# Patient Record
Sex: Female | Born: 1995 | Race: White | Hispanic: No | Marital: Married | Smoking: Never smoker
Health system: Southern US, Community
[De-identification: ages and names within clinical notes are randomized; demographics above are authoritative.]

## PROBLEM LIST (undated history)

## (undated) DIAGNOSIS — R011 Cardiac murmur, unspecified: Secondary | ICD-10-CM

## (undated) DIAGNOSIS — E785 Hyperlipidemia, unspecified: Secondary | ICD-10-CM

## (undated) DIAGNOSIS — K219 Gastro-esophageal reflux disease without esophagitis: Secondary | ICD-10-CM

## (undated) DIAGNOSIS — R748 Abnormal levels of other serum enzymes: Secondary | ICD-10-CM

## (undated) DIAGNOSIS — G43909 Migraine, unspecified, not intractable, without status migrainosus: Secondary | ICD-10-CM

## (undated) DIAGNOSIS — N946 Dysmenorrhea, unspecified: Secondary | ICD-10-CM

## (undated) DIAGNOSIS — F419 Anxiety disorder, unspecified: Secondary | ICD-10-CM

## (undated) DIAGNOSIS — R970 Elevated carcinoembryonic antigen [CEA]: Secondary | ICD-10-CM

## (undated) DIAGNOSIS — R102 Pelvic and perineal pain unspecified side: Secondary | ICD-10-CM

## (undated) DIAGNOSIS — F909 Attention-deficit hyperactivity disorder, unspecified type: Secondary | ICD-10-CM

## (undated) DIAGNOSIS — R112 Nausea with vomiting, unspecified: Secondary | ICD-10-CM

## (undated) DIAGNOSIS — F329 Major depressive disorder, single episode, unspecified: Secondary | ICD-10-CM

## (undated) DIAGNOSIS — Z8616 Personal history of COVID-19: Secondary | ICD-10-CM

## (undated) DIAGNOSIS — Z9889 Other specified postprocedural states: Secondary | ICD-10-CM

## (undated) DIAGNOSIS — F32A Depression, unspecified: Secondary | ICD-10-CM

## (undated) DIAGNOSIS — G4733 Obstructive sleep apnea (adult) (pediatric): Secondary | ICD-10-CM

## (undated) DIAGNOSIS — N939 Abnormal uterine and vaginal bleeding, unspecified: Secondary | ICD-10-CM

## (undated) DIAGNOSIS — L4050 Arthropathic psoriasis, unspecified: Secondary | ICD-10-CM

## (undated) HISTORY — DX: Anxiety disorder, unspecified: F41.9

## (undated) HISTORY — DX: Personal history of COVID-19: Z86.16

## (undated) HISTORY — DX: Migraine, unspecified, not intractable, without status migrainosus: G43.909

## (undated) HISTORY — DX: Major depressive disorder, single episode, unspecified: F32.9

## (undated) HISTORY — DX: Depression, unspecified: F32.A

## (undated) HISTORY — DX: Dysmenorrhea, unspecified: N94.6

---
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
## 2014-07-12 ENCOUNTER — Ambulatory Visit (INDEPENDENT_AMBULATORY_CARE_PROVIDER_SITE_OTHER): Payer: BLUE CROSS/BLUE SHIELD | Admitting: Obstetrics and Gynecology

## 2014-07-12 ENCOUNTER — Encounter: Payer: Self-pay | Admitting: Obstetrics and Gynecology

## 2014-07-12 VITALS — BP 110/70 | HR 99 | Ht 62.0 in | Wt 154.9 lb

## 2014-07-12 DIAGNOSIS — Z30430 Encounter for insertion of intrauterine contraceptive device: Secondary | ICD-10-CM

## 2014-07-12 DIAGNOSIS — Z309 Encounter for contraceptive management, unspecified: Secondary | ICD-10-CM | POA: Insufficient documentation

## 2014-07-12 DIAGNOSIS — Z30431 Encounter for routine checking of intrauterine contraceptive device: Secondary | ICD-10-CM | POA: Diagnosis not present

## 2014-07-12 DIAGNOSIS — N946 Dysmenorrhea, unspecified: Secondary | ICD-10-CM

## 2014-07-12 DIAGNOSIS — F329 Major depressive disorder, single episode, unspecified: Secondary | ICD-10-CM

## 2014-07-12 DIAGNOSIS — F419 Anxiety disorder, unspecified: Principal | ICD-10-CM

## 2014-07-12 NOTE — Patient Instructions (Signed)
Can remove in 3 years, or sooner if pregnancy desired.  To f/u within 1 year for initial pap smear.

## 2014-07-12 NOTE — Progress Notes (Signed)
  GYNECOLOGY CLINIC PROGRESS NOTE  History:  19 y.o. P0 here today for today for IUD string check; Skyla IUD was placed  4 weeks ago. No complaints about the Mirena, no concerning side effects.  Has had a menstrual cycle which was normal.   The following portions of the patient's history were reviewed and updated as appropriate: allergies, current medications, past family history, past medical history, past social history, past surgical history and problem list.  Review of Systems:  Pertinent items are noted in HPI.  Objective:  Physical Exam Blood pressure 110/70, pulse 99, height 5\' 2"  (1.575 m), weight 154 lb 14.4 oz (70.262 kg), last menstrual period 07/07/2014. Gen: NAD Abd: Soft, nontender and nondistended Pelvic: Normal appearing external genitalia; normal appearing vaginal mucosa and cervix.  IUD strings visualized, about 2 cm in length outside cervix.   Assessment & Plan:  Normal IUD check. Patient to keep IUD in place for three years; can come in for removal if she desires pregnancy within the next three years. Routine preventative health maintenance measures emphasized.    Hildred Laser, MD [facility]

## 2014-07-16 ENCOUNTER — Telehealth: Payer: Self-pay | Admitting: Gastroenterology

## 2014-07-16 NOTE — Telephone Encounter (Signed)
Go ahead and schedule biopsy?

## 2014-07-16 NOTE — Telephone Encounter (Signed)
Pt mother called, She had her daughters labs repeated at her job, And she said the liver enzymes were still elevated, per mother it was mentioned at 04/2014 visit if they came back still abnormal then she may need a liver BX, mother will fax labs over they will be in chart soon, please contact mother with next step after reviewing labs.

## 2014-07-18 ENCOUNTER — Other Ambulatory Visit: Payer: Self-pay

## 2014-07-18 DIAGNOSIS — R748 Abnormal levels of other serum enzymes: Secondary | ICD-10-CM

## 2014-07-18 NOTE — Telephone Encounter (Signed)
Yes please set patient up for a liver biopsy.

## 2014-07-18 NOTE — Telephone Encounter (Signed)
LVM for mother to return my call to schedule biopsy.

## 2014-07-20 NOTE — Telephone Encounter (Signed)
Spoke with Mother Carollee HerterShannon regarding pt's liver biopsy. Per Radiology dept, H & P needs to be within 30 days of the biopsy. Pt's last OV was in April 2016. Mother has decided to schedule appt with Dr. Servando SnareWohl to discuss risks of the biopsy before scheduling.

## 2014-07-25 ENCOUNTER — Other Ambulatory Visit: Payer: Self-pay | Admitting: Gastroenterology

## 2014-07-25 DIAGNOSIS — R7989 Other specified abnormal findings of blood chemistry: Secondary | ICD-10-CM

## 2014-07-25 DIAGNOSIS — R945 Abnormal results of liver function studies: Principal | ICD-10-CM

## 2014-07-31 ENCOUNTER — Ambulatory Visit: Payer: Self-pay | Admitting: Gastroenterology

## 2014-07-31 ENCOUNTER — Encounter: Payer: Self-pay | Admitting: Gastroenterology

## 2014-07-31 ENCOUNTER — Ambulatory Visit (INDEPENDENT_AMBULATORY_CARE_PROVIDER_SITE_OTHER): Payer: BLUE CROSS/BLUE SHIELD | Admitting: Gastroenterology

## 2014-07-31 ENCOUNTER — Other Ambulatory Visit: Payer: Self-pay

## 2014-07-31 ENCOUNTER — Other Ambulatory Visit: Payer: Self-pay | Admitting: Gastroenterology

## 2014-07-31 VITALS — BP 129/74 | HR 84 | Temp 98.6°F | Ht 62.0 in | Wt 154.0 lb

## 2014-07-31 DIAGNOSIS — G441 Vascular headache, not elsewhere classified: Secondary | ICD-10-CM | POA: Insufficient documentation

## 2014-07-31 DIAGNOSIS — R748 Abnormal levels of other serum enzymes: Secondary | ICD-10-CM | POA: Insufficient documentation

## 2014-07-31 DIAGNOSIS — R970 Elevated carcinoembryonic antigen [CEA]: Secondary | ICD-10-CM | POA: Insufficient documentation

## 2014-07-31 DIAGNOSIS — E785 Hyperlipidemia, unspecified: Secondary | ICD-10-CM | POA: Insufficient documentation

## 2014-07-31 DIAGNOSIS — E559 Vitamin D deficiency, unspecified: Secondary | ICD-10-CM | POA: Insufficient documentation

## 2014-07-31 DIAGNOSIS — R011 Cardiac murmur, unspecified: Secondary | ICD-10-CM | POA: Insufficient documentation

## 2014-07-31 DIAGNOSIS — K219 Gastro-esophageal reflux disease without esophagitis: Secondary | ICD-10-CM | POA: Insufficient documentation

## 2014-07-31 NOTE — Progress Notes (Signed)
   Primary Care Physician: Evelene CroonNIEMEYER, MEINDERT, MD  Primary Gastroenterologist:  Dr. Midge Miniumarren Elika Godar  Chief Complaint  Patient presents with  . Elevated Hepatic Enzymes    HPI: Rose Howard is a 19 y.o. female here who has abnormal liver enzymes and is in need of a liver biopsy. The patient could not undergo a liver biopsy unless she had a H&P within the last 30 days. Is no complaint the present time.  Current Outpatient Prescriptions  Medication Sig Dispense Refill  . ALPRAZolam (XANAX) 0.5 MG tablet Take by mouth.    . Levonorgestrel (SKYLA) 13.5 MG IUD by Intrauterine route.     No current facility-administered medications for this visit.    Allergies as of 07/31/2014  . (No Known Allergies)    ROS:  General: Negative for anorexia, weight loss, fever, chills, fatigue, weakness. ENT: Negative for hoarseness, difficulty swallowing , nasal congestion. CV: Negative for chest pain, angina, palpitations, dyspnea on exertion, peripheral edema.  Respiratory: Negative for dyspnea at rest, dyspnea on exertion, cough, sputum, wheezing.  GI: See history of present illness. GU:  Negative for dysuria, hematuria, urinary incontinence, urinary frequency, nocturnal urination.  Endo: Negative for unusual weight change.    Physical Examination:   BP 129/74 mmHg  Pulse 84  Temp(Src) 98.6 F (37 C)  Ht 5\' 2"  (1.575 m)  Wt 154 lb (69.854 kg)  BMI 28.16 kg/m2  LMP 05/25/2014  General: Well-nourished, well-developed in no acute distress.  Eyes: No icterus. Conjunctivae pink. Mouth: Oropharyngeal mucosa moist and pink , no lesions erythema or exudate. Lungs: Clear to auscultation bilaterally. Non-labored. Heart: Regular rate and rhythm, no murmurs rubs or gallops.  Abdomen: Bowel sounds are normal, nontender, nondistended, no hepatosplenomegaly or masses, no abdominal bruits or hernia , no rebound or guarding.   Extremities: No lower extremity edema. No clubbing or deformities. Neuro: Alert  and oriented x 3.  Grossly intact. Skin: Warm and dry, no jaundice.   Psych: Alert and cooperative, normal mood and affect.  Labs:    Imaging Studies: No results found.  Assessment and Plan:   Rose Howard is a 19 y.o. y/o female who has abnormal liver enzymes and is set up to have a liver biopsy. The patient has been given an opportunity to ask questions about the liver biopsy. Also been told to stay away from anti-inflammatory is an aspirin for 1 week prior to having a liver biopsy.

## 2014-08-09 ENCOUNTER — Other Ambulatory Visit: Payer: Self-pay | Admitting: Radiology

## 2014-08-10 ENCOUNTER — Ambulatory Visit
Admission: RE | Admit: 2014-08-10 | Discharge: 2014-08-10 | Disposition: A | Discharge status: Home / Self Care | Payer: BLUE CROSS/BLUE SHIELD | Source: Ambulatory Visit | Attending: Gastroenterology | Admitting: Gastroenterology

## 2014-08-10 DIAGNOSIS — Z8249 Family history of ischemic heart disease and other diseases of the circulatory system: Secondary | ICD-10-CM | POA: Diagnosis not present

## 2014-08-10 DIAGNOSIS — K219 Gastro-esophageal reflux disease without esophagitis: Secondary | ICD-10-CM | POA: Diagnosis not present

## 2014-08-10 DIAGNOSIS — Z975 Presence of (intrauterine) contraceptive device: Secondary | ICD-10-CM | POA: Insufficient documentation

## 2014-08-10 DIAGNOSIS — R011 Cardiac murmur, unspecified: Secondary | ICD-10-CM | POA: Insufficient documentation

## 2014-08-10 DIAGNOSIS — R945 Abnormal results of liver function studies: Secondary | ICD-10-CM

## 2014-08-10 DIAGNOSIS — R970 Elevated carcinoembryonic antigen [CEA]: Secondary | ICD-10-CM | POA: Diagnosis not present

## 2014-08-10 DIAGNOSIS — E559 Vitamin D deficiency, unspecified: Secondary | ICD-10-CM | POA: Insufficient documentation

## 2014-08-10 DIAGNOSIS — F418 Other specified anxiety disorders: Secondary | ICD-10-CM | POA: Insufficient documentation

## 2014-08-10 DIAGNOSIS — K7290 Hepatic failure, unspecified without coma: Secondary | ICD-10-CM | POA: Insufficient documentation

## 2014-08-10 DIAGNOSIS — E785 Hyperlipidemia, unspecified: Secondary | ICD-10-CM | POA: Insufficient documentation

## 2014-08-10 DIAGNOSIS — R7989 Other specified abnormal findings of blood chemistry: Secondary | ICD-10-CM

## 2014-08-10 DIAGNOSIS — I899 Noninfective disorder of lymphatic vessels and lymph nodes, unspecified: Secondary | ICD-10-CM | POA: Diagnosis not present

## 2014-08-10 DIAGNOSIS — N946 Dysmenorrhea, unspecified: Secondary | ICD-10-CM | POA: Diagnosis not present

## 2014-08-10 DIAGNOSIS — G441 Vascular headache, not elsewhere classified: Secondary | ICD-10-CM | POA: Insufficient documentation

## 2014-08-10 HISTORY — PX: LIVER BIOPSY: SHX301

## 2014-08-10 HISTORY — DX: Abnormal levels of other serum enzymes: R74.8

## 2014-08-10 HISTORY — DX: Gastro-esophageal reflux disease without esophagitis: K21.9

## 2014-08-10 LAB — CBC
HCT: 42.1 % (ref 35.0–47.0)
Hemoglobin: 14.3 g/dL (ref 12.0–16.0)
MCH: 29.9 pg (ref 26.0–34.0)
MCHC: 34.1 g/dL (ref 32.0–36.0)
MCV: 87.7 fL (ref 80.0–100.0)
Platelets: 335 10*3/uL (ref 150–440)
RBC: 4.8 MIL/uL (ref 3.80–5.20)
RDW: 12.5 % (ref 11.5–14.5)
WBC: 7.1 10*3/uL (ref 3.6–11.0)

## 2014-08-10 LAB — PROTIME-INR
INR: 0.93
Prothrombin Time: 12.7 seconds (ref 11.4–15.0)

## 2014-08-10 MED ORDER — MIDAZOLAM HCL 5 MG/5ML IJ SOLN
INTRAMUSCULAR | Status: AC | PRN
  Administered 2014-08-10 (×2): 0.5 mg via INTRAVENOUS
  Administered 2014-08-10: 1 mg via INTRAVENOUS

## 2014-08-10 MED ORDER — KETOROLAC TROMETHAMINE 30 MG/ML IJ SOLN
30.0000 mg | Freq: Once | INTRAMUSCULAR | Status: AC
  Administered 2014-08-10: 30 mg via INTRAVENOUS
  Filled 2014-08-10: qty 1

## 2014-08-10 MED ORDER — FENTANYL CITRATE (PF) 100 MCG/2ML IJ SOLN
INTRAMUSCULAR | Status: AC | PRN
  Administered 2014-08-10: 25 ug via INTRAVENOUS
  Administered 2014-08-10: 50 ug via INTRAVENOUS

## 2014-08-10 MED ORDER — SODIUM CHLORIDE 0.9 % IV SOLN: Freq: Once | INTRAVENOUS | Status: DC

## 2014-08-10 NOTE — Procedures (Signed)
Interventional Radiology Procedure Note  Procedure: US guided   core biopsy of right liver lobe.  Complications: None Recommendations: - Bedrest supine x 2 hrs   - Follow biopsy results  Signed,  Yvone Neu. Loreta Ave, DO

## 2014-08-10 NOTE — H&P (Signed)
Chief Complaint: I am here for a liver biopsy.   Referring Physician(s): Wohl,Darren  History of Present Illness: Rose Howard is a 19 y.o. female referred by Dr. Servando Snare for a liver biopsy.  She has no significant medical history, but was found to have transaminitis on recent lab exams.    She has no history of bleeding problems.   Past Medical History  Diagnosis Date  . Anxiety   . Depression   . Dysmenorrhea   . GERD (gastroesophageal reflux disease)   . Abnormal liver enzymes     Past Surgical History  Procedure Laterality Date  . Breast surgery Bilateral 2014    Breast Reduction    Allergies: Review of patient's allergies indicates no known allergies.  Medications: Prior to Admission medications   Medication Sig Start Date End Date Taking? Authorizing Provider  ALPRAZolam Prudy Feeler) 0.5 MG tablet Take by mouth.   Yes Historical Provider, MD  Levonorgestrel (SKYLA) 13.5 MG IUD by Intrauterine route.    Historical Provider, MD     Family History  Problem Relation Age of Onset  . Heart disease Father   . Hypercholesterolemia Mother     History   Social History  . Marital Status: Single    Spouse Name: N/A  . Number of Children: N/A  . Years of Education: N/A   Social History Main Topics  . Smoking status: Never Smoker   . Smokeless tobacco: Never Used  . Alcohol Use: 0.6 oz/week    1 Glasses of wine per week  . Drug Use: No  . Sexual Activity: Yes    Birth Control/ Protection: IUD     Comment: Skyla inserted 06/13/2014   Other Topics Concern  . None   Social History Narrative       Review of Systems: A 12 point ROS discussed and pertinent positives are indicated in the HPI above.  All other systems are negative.  Review of Systems  Vital Signs: BP 128/98 mmHg  Pulse 66  Temp(Src) 98.5 F (36.9 C) (Oral)  Resp 13  Ht 5\' 2"  (1.575 m)  Wt 150 lb (68.04 kg)  BMI 27.43 kg/m2  SpO2 100%  LMP 05/25/2014 (Exact Date)  Physical  Exam  Mallampati Score:  2  Imaging: No results found.  Labs:  CBC:  Recent Labs  08/10/14 1050  WBC 7.1  HGB 14.3  HCT 42.1  PLT 335    COAGS:  Recent Labs  08/10/14 1050  INR 0.93    BMP: No results for input(s): NA, K, CL, CO2, GLUCOSE, BUN, CALCIUM, CREATININE, GFRNONAA, GFRAA in the last 8760 hours.  Invalid input(s): CMP  LIVER FUNCTION TESTS: No results for input(s): BILITOT, AST, ALT, ALKPHOS, PROT, ALBUMIN in the last 8760 hours.  TUMOR MARKERS: No results for input(s): AFPTM, CEA, CA199, CHROMGRNA in the last 8760 hours.  Assessment and Plan: Rose Howard is a 19 yo female presenting for a medical-liver biopsy.    Percutaneous biopsy is planned.    Risks and Benefits discussed with the patient and the patient's mother including, but not limited to bleeding, infection, damage to adjacent structures or low yield requiring additional tests. All of the patient's questions were answered, patient is agreeable to proceed. Consent signed and in chart.    Thank you for this interesting consult.  I greatly enjoyed meeting [facility] and look forward to participating in their care.  A copy of this report was sent to the requesting provider on this date.  SignedGilmer Mor 08/10/2014, 12:46 PM   I spent a total of    15 Minutes in face to face in clinical consultation, greater than 50% of which was counseling/coordinating care for transaminitis, percutaneous liver biopsy.

## 2014-08-15 LAB — SURGICAL PATHOLOGY

## 2014-09-05 ENCOUNTER — Telehealth: Payer: Self-pay | Admitting: Gastroenterology

## 2014-09-05 NOTE — Telephone Encounter (Signed)
Results please?

## 2014-09-06 NOTE — Telephone Encounter (Signed)
Please review pt's liver biopsy and advise.

## 2014-09-11 NOTE — Telephone Encounter (Signed)
LVM for pt to schedule follow up Liver biopsy results.

## 2014-09-11 NOTE — Telephone Encounter (Signed)
Have the patient come in to discuss the results

## 2014-10-24 ENCOUNTER — Encounter: Payer: Self-pay | Admitting: Gastroenterology

## 2014-10-24 ENCOUNTER — Ambulatory Visit (INDEPENDENT_AMBULATORY_CARE_PROVIDER_SITE_OTHER): Payer: BLUE CROSS/BLUE SHIELD | Admitting: Gastroenterology

## 2014-10-24 VITALS — BP 116/67 | HR 74 | Temp 98.1°F | Ht 62.0 in | Wt 156.0 lb

## 2014-10-24 DIAGNOSIS — R748 Abnormal levels of other serum enzymes: Secondary | ICD-10-CM

## 2014-10-24 NOTE — Progress Notes (Signed)
   Primary Care Physician: Evelene Croon, MD  Primary Gastroenterologist:  Dr. Midge Minium  Chief Complaint  Patient presents with  . follow up liver biopsy    HPI: Rose Howard is a 19 y.o. female here for follow-up after having a liver biopsy. The liver biopsy showed a lymphocytic infiltrate with eosinophils. The pathologist reported this to be a drug effect versus viral with less likely early stage autoimmune disease. The patient has had these abnormal liver enzymes since 2010 as reported by her. There was minimal inflammation seen without any fibrosis or increased iron staining.  Current Outpatient Prescriptions  Medication Sig Dispense Refill  . ALPRAZolam (XANAX) 0.5 MG tablet Take by mouth.    . Levonorgestrel (SKYLA) 13.5 MG IUD by Intrauterine route.     No current facility-administered medications for this visit.    Allergies as of 10/24/2014  . (No Known Allergies)    ROS:  General: Negative for anorexia, weight loss, fever, chills, fatigue, weakness. ENT: Negative for hoarseness, difficulty swallowing , nasal congestion. CV: Negative for chest pain, angina, palpitations, dyspnea on exertion, peripheral edema.  Respiratory: Negative for dyspnea at rest, dyspnea on exertion, cough, sputum, wheezing.  GI: See history of present illness. GU:  Negative for dysuria, hematuria, urinary incontinence, urinary frequency, nocturnal urination.  Endo: Negative for unusual weight change.    Physical Examination:   BP 116/67 mmHg  Pulse 74  Temp(Src) 98.1 F (36.7 C) (Oral)  Ht  (1.575 m)  Wt 156 lb (70.761 kg)  BMI 28.53 kg/m2  General: Well-nourished, well-developed in no acute distress.  Eyes: No icterus. Conjunctivae pink. Neuro: Alert and oriented x 3.  Grossly intact. Skin: Warm and dry, no jaundice.   Psych: Alert and cooperative, normal mood and affect.  Labs:    Imaging Studies: No results found.  Assessment and Plan:   Rose Howard is a 19  y.o. y/o female who comes today for the results of her liver biopsy. The biopsy was consistent with viral versus medication effect and less likely early autoimmune disease. The patient only takes Advil as needed and is on birth control. She denies any other medications and states this is been going on since 2010. She has been told that the amount of damage from her liver enzymes being elevated this long was minimal. The patient's SMA was elevated slightly. He should has been reassured that the amount of damage is minimal and we will recheck her liver enzymes today. The patient has been explained the plan and agrees with it.    Note: This dictation was prepared with Dragon dictation along with smaller phrase technology. Any transcriptional errors that result from this process are unintentional.

## 2015-07-03 ENCOUNTER — Encounter: Payer: Self-pay | Admitting: Obstetrics and Gynecology

## 2015-07-03 ENCOUNTER — Ambulatory Visit (INDEPENDENT_AMBULATORY_CARE_PROVIDER_SITE_OTHER): Payer: Self-pay | Admitting: Obstetrics and Gynecology

## 2015-07-03 VITALS — BP 109/74 | HR 74 | Ht 62.0 in | Wt 153.8 lb

## 2015-07-03 DIAGNOSIS — N898 Other specified noninflammatory disorders of vagina: Secondary | ICD-10-CM

## 2015-07-03 NOTE — Progress Notes (Signed)
Subjective:     Rose Howard is a 20 y.o. female who presents for evaluation of an abnormal vaginal discharge. Symptoms have been present for 2 months. Vaginal symptoms: discharge described as yellowish and odor occasionally. Contraception: IUD. She denies abnormal bleeding, burning, pain, vulvar itching and urinary symptoms. Sexually transmitted infection risk: very low risk of STD exposure (currently with 1 partner). Menstrual flow: regular every 28-30 days.  Recently completed antibiotic course for sinus infection.   The following portions of the patient's history were reviewed and updated as appropriate: allergies, current medications, past family history, past medical history, past social history, past surgical history and problem list.   Review of Systems Pertinent items noted in HPI and remainder of comprehensive ROS otherwise negative.    Objective:    BP 109/74 mmHg  Pulse 74  Ht 5\' 2"  (1.575 m)  Wt 153 lb 12.8 oz (69.763 kg)  BMI 28.12 kg/m2 General appearance: alert and no distress Abdomen: soft, non-tender; bowel sounds normal; no masses,  no organomegaly Pelvic: external genitalia normal, rectovaginal septum normal.  Vagina with scant thin white discharge, no odor.  Cervix normal appearing, no lesions and no motion tenderness. IUD strings visible ~ 2 cm from cervical os. Uterus mobile, nontender, normal shape and size.  Adnexae non-palpable, nontender bilaterally.       Microscopic wet-mount exam shows negative for pathogens, normal epithelial cells.  Assessment:    Normal physiologic discharge.    Plan:    Symptomatic local care discussed.   Nuswab performed.  Support your immune system: reduce stress, get adequate sleep and exercise regularly. Eat plain, probiotic yogurt daily or take a daily vaginal probiotic (can be found in the feminine aisle at any pharmacy) containing Acidophillus (they should contain at least 1 billion CFU's- this is information found on the  bottle).  Avoid harsh soaps, and detergents.  Can use vaginal washes like Vagisil or Summer's Eve.  Avoid routine douching, and if you notice that sex can trigger infections for you, use condoms. For some women who get infections easily, both douching and the alkaline nature of semen can disrupt the vaginal pH. Call for results in 1 week.     Hildred LaserAnika Winton Offord, MD [facility]

## 2015-07-07 LAB — NUSWAB VAGINITIS PLUS (VG+)
CHLAMYDIA TRACHOMATIS, NAA: NEGATIVE
Candida albicans, NAA: NEGATIVE
Candida glabrata, NAA: NEGATIVE
Neisseria gonorrhoeae, NAA: NEGATIVE
Trich vag by NAA: NEGATIVE

## 2016-04-10 ENCOUNTER — Encounter: Payer: Self-pay | Admitting: Obstetrics and Gynecology

## 2016-04-10 ENCOUNTER — Ambulatory Visit (INDEPENDENT_AMBULATORY_CARE_PROVIDER_SITE_OTHER): Payer: BLUE CROSS/BLUE SHIELD | Admitting: Obstetrics and Gynecology

## 2016-04-10 VITALS — BP 115/77 | HR 61 | Ht 61.0 in | Wt 149.7 lb

## 2016-04-10 DIAGNOSIS — L739 Follicular disorder, unspecified: Secondary | ICD-10-CM | POA: Diagnosis not present

## 2016-04-10 NOTE — Progress Notes (Signed)
    GYNECOLOGY PROGRESS NOTE  Subjective:    Patient ID: Brylee H. Hebard, female    DOB: 01/24/1995, 21 y.o.   MRN: [id number]  HPI  Patient is a 21 y.o. G0P0000 female who presents for for complaints of "knots on her vagina".  Patient states that this has been occurring intermittently since she was a teenager.  Occurs often times after shaving, but sometimes happens even without shaving.  Denies STI exposure.   The following portions of the patient's history were reviewed and updated as appropriate: allergies, current medications, past family history, past medical history, past social history, past surgical history and problem list.  Review of Systems Pertinent items noted in HPI and remainder of comprehensive ROS otherwise negative.   Objective:   Blood pressure 115/77, pulse 61, height 5\' 1"  (1.549 m), weight 149 lb 11.2 oz (67.9 kg). General appearance: alert and no distress Pelvic: External genitalia with several raised slightly erythematous lesions resembling folliculitis on right labia majora. Normal vaginal mucosa, no lesions. No vaginal discharge. Cervix appears normal, IUD strings visible from cervical os approximately 3 cm in length. Bimanual exam deferred.  Assessment:   Folliculitis  Plan:   - Discussed hygiene measures including wearing looser clothing (patient notes that she wears a lot of tight fitting pants and underwear, however mostly cotton material), sterilizing razors with alcohol, using an antibacterial soap and vaginal region prior to shaving.  Also encouraged use of antiseptic treatment after shaving, such as Tin-skin.  - To follow-up as needed.   Hildred LaserAnika Gerardo Territo, MD [facility]

## 2016-04-10 NOTE — Patient Instructions (Signed)
Folliculitis Folliculitis is inflammation of the hair follicles. Folliculitis most commonly occurs on the scalp, thighs, legs, back, and buttocks. However, it can occur anywhere on the body. What are the causes? This condition may be caused by:  A bacterial infection (common).  A fungal infection.  A viral infection.  Coming into contact with certain chemicals, especially oils and tars.  Shaving or waxing.  Applying greasy ointments or creams to your skin often. Long-lasting folliculitis and folliculitis that keeps coming back can be caused by bacteria that live in the nostrils. What increases the risk? This condition is more likely to develop in people with:  A weakened immune system.  Diabetes.  Obesity. What are the signs or symptoms? Symptoms of this condition include:  Redness.  Soreness.  Swelling.  Itching.  Small white or yellow, pus-filled, itchy spots (pustules) that appear over a reddened area. If there is an infection that goes deep into the follicle, these may develop into a boil (furuncle).  A group of closely packed boils (carbuncle). These tend to form in hairy, sweaty areas of the body. How is this diagnosed? This condition is diagnosed with a skin exam. To find what is causing the condition, your health care provider may take a sample of one of the pustules or boils for testing. How is this treated? This condition may be treated by:  Applying warm compresses to the affected areas.  Taking an antibiotic medicine or applying an antibiotic medicine to the skin.  Applying or bathing with an antiseptic solution.  Taking an over-the-counter medicine to help with itching.  Having a procedure to drain any pustules or boils. This may be done if a pustule or boil contains a lot of pus or fluid.  Laser hair removal. This may be done to treat long-lasting folliculitis. Follow these instructions at home:  If directed, apply heat to the affected area as  often as told by your health care provider. Use the heat source that your health care provider recommends, such as a moist heat pack or a heating pad.  Place a towel between your skin and the heat source.  Leave the heat on for 20-30 minutes.  Remove the heat if your skin turns bright red. This is especially important if you are unable to feel pain, heat, or cold. You may have a greater risk of getting burned.  If you were prescribed an antibiotic medicine, use it as told by your health care provider. Do not stop using the antibiotic even if you start to feel better.  Take over-the-counter and prescription medicines only as told by your health care provider.  Do not shave irritated skin.  Keep all follow-up visits as told by your health care provider. This is important. Get help right away if:  You have more redness, swelling, or pain in the affected area.  Red streaks are spreading from the affected area.  You have a fever. This information is not intended to replace advice given to you by your health care provider. Make sure you discuss any questions you have with your health care provider. Document Released: 03/16/2001 Document Revised: 07/26/2015 Document Reviewed: 10/26/2014 Elsevier Interactive Patient Education  2017 [employer]  

## 2016-06-23 ENCOUNTER — Encounter: Payer: Self-pay | Admitting: Emergency Medicine

## 2016-06-23 DIAGNOSIS — R112 Nausea with vomiting, unspecified: Secondary | ICD-10-CM | POA: Insufficient documentation

## 2016-06-23 DIAGNOSIS — R109 Unspecified abdominal pain: Secondary | ICD-10-CM | POA: Diagnosis present

## 2016-06-23 DIAGNOSIS — R1084 Generalized abdominal pain: Secondary | ICD-10-CM | POA: Insufficient documentation

## 2016-06-23 LAB — COMPREHENSIVE METABOLIC PANEL
ALBUMIN: 4.8 g/dL (ref 3.5–5.0)
ALK PHOS: 141 U/L — AB (ref 38–126)
ALT: 132 U/L — ABNORMAL HIGH (ref 14–54)
ANION GAP: 11 (ref 5–15)
AST: 71 U/L — ABNORMAL HIGH (ref 15–41)
BILIRUBIN TOTAL: 0.9 mg/dL (ref 0.3–1.2)
BUN: 13 mg/dL (ref 6–20)
CO2: 27 mmol/L (ref 22–32)
CREATININE: 0.72 mg/dL (ref 0.44–1.00)
Calcium: 9.9 mg/dL (ref 8.9–10.3)
Chloride: 102 mmol/L (ref 101–111)
GFR calc Af Amer: >60 mL/min (ref >60)
GFR calc non Af Amer: >60 mL/min (ref >60)
GLUCOSE: 99 mg/dL (ref 65–99)
Potassium: 3.5 mmol/L (ref 3.5–5.1)
SODIUM: 140 mmol/L (ref 135–145)
Total Protein: 7.9 g/dL (ref 6.5–8.1)

## 2016-06-23 LAB — CBC
HCT: 42.4 % (ref 35.0–47.0)
Hemoglobin: 14.9 g/dL (ref 12.0–16.0)
MCH: 30 pg (ref 26.0–34.0)
MCHC: 35.2 g/dL (ref 32.0–36.0)
MCV: 85.1 fL (ref 80.0–100.0)
PLATELETS: 359 10*3/uL (ref 150–440)
RBC: 4.98 MIL/uL (ref 3.80–5.20)
RDW: 12.4 % (ref 11.5–14.5)
WBC: 13.4 10*3/uL — ABNORMAL HIGH (ref 3.6–11.0)

## 2016-06-23 LAB — LIPASE, BLOOD: Lipase: 27 U/L (ref 11–51)

## 2016-06-23 NOTE — ED Triage Notes (Signed)
Pt presents to ED with intermittent abd cramping, nausea, and occasional diarrhea the past couple of days. Pt states today her symptoms have continued and she vomited 3X. Denies any cramping currently. Last time vomiting was about an hour ago. Pt alert and calm at this time. Denies any urinary symptoms.

## 2016-06-24 ENCOUNTER — Emergency Department: Payer: BLUE CROSS/BLUE SHIELD

## 2016-06-24 ENCOUNTER — Emergency Department
Admission: EM | Admit: 2016-06-24 | Discharge: 2016-06-24 | Disposition: A | Discharge status: Home / Self Care | Payer: BLUE CROSS/BLUE SHIELD | Attending: Emergency Medicine | Admitting: Emergency Medicine

## 2016-06-24 DIAGNOSIS — R1084 Generalized abdominal pain: Secondary | ICD-10-CM

## 2016-06-24 DIAGNOSIS — R112 Nausea with vomiting, unspecified: Secondary | ICD-10-CM

## 2016-06-24 DIAGNOSIS — R197 Diarrhea, unspecified: Secondary | ICD-10-CM

## 2016-06-24 LAB — URINALYSIS, COMPLETE (UACMP) WITH MICROSCOPIC
BILIRUBIN URINE: NEGATIVE
GLUCOSE, UA: NEGATIVE mg/dL
HGB URINE DIPSTICK: NEGATIVE
Ketones, ur: 5 mg/dL — AB
Nitrite: NEGATIVE
PROTEIN: 30 mg/dL — AB
Specific Gravity, Urine: 1.029 (ref 1.005–1.030)
pH: 6 (ref 5.0–8.0)

## 2016-06-24 LAB — GASTROINTESTINAL PANEL BY PCR, STOOL (REPLACES STOOL CULTURE)
ADENOVIRUS F40/41: NOT DETECTED
ASTROVIRUS: NOT DETECTED
CAMPYLOBACTER SPECIES: NOT DETECTED
Cryptosporidium: NOT DETECTED
Cyclospora cayetanensis: NOT DETECTED
ENTEROPATHOGENIC E COLI (EPEC): NOT DETECTED
Entamoeba histolytica: NOT DETECTED
Enteroaggregative E coli (EAEC): NOT DETECTED
Enterotoxigenic E coli (ETEC): NOT DETECTED
Giardia lamblia: NOT DETECTED
NOROVIRUS GI/GII: NOT DETECTED
Plesimonas shigelloides: NOT DETECTED
ROTAVIRUS A: NOT DETECTED
SAPOVIRUS (I, II, IV, AND V): DETECTED — AB
SHIGA LIKE TOXIN PRODUCING E COLI (STEC): NOT DETECTED
Salmonella species: NOT DETECTED
Shigella/Enteroinvasive E coli (EIEC): NOT DETECTED
Vibrio cholerae: NOT DETECTED
Vibrio species: NOT DETECTED
Yersinia enterocolitica: NOT DETECTED

## 2016-06-24 LAB — C DIFFICILE QUICK SCREEN W PCR REFLEX
C DIFFICILE (CDIFF) INTERP: NOT DETECTED
C DIFFICILE (CDIFF) TOXIN: NEGATIVE
C DIFFICLE (CDIFF) ANTIGEN: NEGATIVE

## 2016-06-24 LAB — PREGNANCY, URINE: PREG TEST UR: NEGATIVE

## 2016-06-24 MED ORDER — SODIUM CHLORIDE 0.9 % IV BOLUS (SEPSIS)
1000.0000 mL | Freq: Once | INTRAVENOUS | Status: AC
Start: 2016-06-24 — End: 2016-06-24
  Administered 2016-06-24: 1000 mL via INTRAVENOUS

## 2016-06-24 MED ORDER — ONDANSETRON 4 MG PO TBDP: 4.0000 mg | ORAL_TABLET | Freq: Three times a day (TID) | ORAL | 0 refills | Status: DC | PRN

## 2016-06-24 MED ORDER — ONDANSETRON 4 MG PO TBDP
4.0000 mg | ORAL_TABLET | Freq: Three times a day (TID) | ORAL | 0 refills | Status: DC | PRN
Start: 2016-06-24 — End: 2016-11-03

## 2016-06-24 MED ORDER — ONDANSETRON HCL 4 MG/2ML IJ SOLN
4.0000 mg | Freq: Once | INTRAMUSCULAR | Status: AC
  Administered 2016-06-24: 4 mg via INTRAVENOUS
  Filled 2016-06-24: qty 2

## 2016-06-24 MED ORDER — IOPAMIDOL (ISOVUE-300) INJECTION 61%
100.0000 mL | Freq: Once | INTRAVENOUS | Status: AC | PRN
  Administered 2016-06-24: 100 mL via INTRAVENOUS

## 2016-06-24 MED ORDER — IOPAMIDOL (ISOVUE-300) INJECTION 61%
30.0000 mL | Freq: Once | INTRAVENOUS | Status: AC | PRN
  Administered 2016-06-24: 30 mL via ORAL

## 2016-06-24 NOTE — ED Notes (Signed)
Pt resting in bed, dressed, awaiting discharge paperwork, awake and alert in no acute distress

## 2016-06-24 NOTE — Discharge Instructions (Signed)
1. You may take Zofran as needed for nausea. 2. Clear liquids 12 hours, then BRAT diet 5 days, then slowly advance diet as tolerated. 3. Return to the ER for worsening symptoms, persistent vomiting, difficulty breathing or other concerns.

## 2016-06-24 NOTE — ED Provider Notes (Signed)
[facility] Emergency Department Provider Note   ____________________________________________   First MD Initiated Contact with Patient 06/24/16 0330     (approximate)  I have reviewed the triage vital signs and the nursing notes.   HISTORY  Chief Complaint Abdominal Pain    HPI Rose Howard is a 21 y.o. female who presents to the ED from home with a chief complaint of abdominal cramping, nausea, vomiting and diarrhea. Patient reports symptoms for the past 2 days. Has had one episode of vomiting but numerous loose stools. Reports taking clindamycin 1 week ago for folliculitis. Denies associated fever, chills, chest pain, shortness of breath, dysuria. Denies recent travel or camping. History of elevated transaminases status post full GI workup including liver biopsy. Nothing makes her symptoms better or worse.   Past Medical History:  Diagnosis Date  . Abnormal liver enzymes   . Anxiety   . Depression   . Dysmenorrhea   . GERD (gastroesophageal reflux disease)     Patient Active Problem List   Diagnosis Date Noted  . Abnormal liver enzymes 07/31/2014  . Acid reflux 07/31/2014  . 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  . Cardiac murmur 07/31/2014  . Avitaminosis D 07/31/2014  . HLD (hyperlipidemia) 07/31/2014  . Elevated carcinoembryonic antigen 07/31/2014  . Anxiety and depression 07/12/2014  . Dysmenorrhea 07/12/2014  . Contraception management 07/12/2014    Past Surgical History:  Procedure Laterality Date  . BREAST SURGERY Bilateral 2014   Breast Reduction  . LIVER BIOPSY  08/10/14    Prior to Admission medications   Medication Sig Start Date End Date Taking? Authorizing Provider  Levonorgestrel (SKYLA) 13.5 MG IUD by Intrauterine route.    [provider]  ondansetron (ZOFRAN ODT) 4 MG disintegrating tablet Take 1 tablet (4 mg total) by mouth every 8 (eight) hours as needed for nausea or vomiting. 06/24/16   Irean Hong, MD     Allergies Patient has no known allergies.  Family History  Problem Relation Age of Onset  . Heart disease Father   . Hypercholesterolemia Mother   None for GI issues  Social History Social History  Substance Use Topics  . Smoking status: Never Smoker  . Smokeless tobacco: Never Used  . Alcohol use 0.6 oz/week    1 Glasses of wine per week    Review of Systems  Constitutional: No fever/chills. Eyes: No visual changes. ENT: No sore throat. Cardiovascular: Denies chest pain. Respiratory: Denies shortness of breath. Gastrointestinal: Positive for abdominal pain, nausea, vomiting and diarrhea.  No constipation. Genitourinary: Negative for dysuria. Musculoskeletal: Negative for back pain. Skin: Negative for rash. Neurological: Negative for headaches, focal weakness or numbness.   ____________________________________________   PHYSICAL EXAM:  VITAL SIGNS: ED Triage Vitals [06/23/16 2315]  Enc Vitals Group     BP 129/89     Pulse Rate 86     Resp 18     Temp 98.5 F (36.9 C)     Temp Source Oral     SpO2 100 %     Weight 153 lb (69.4 kg)     Height 5\' 2"  (1.575 m)     Head Circumference      Peak Flow      Pain Score 0     Pain Loc      Pain Edu?      Excl. in GC?     Constitutional: Alert and oriented. Well appearing and in no acute distress. Eyes: Conjunctivae are normal. PERRL. EOMI. Head:  Atraumatic. Nose: No congestion/rhinnorhea. Mouth/Throat: Mucous membranes are moist.  Oropharynx non-erythematous. Neck: No stridor.   Cardiovascular: Normal rate, regular rhythm. Grossly normal heart sounds.  Good peripheral circulation. Respiratory: Normal respiratory effort.  No retractions. Lungs CTAB. Gastrointestinal: Soft and mildly tender to palpation diffusely without rebound or guarding. No distention. No abdominal bruits. No CVA tenderness. Musculoskeletal: No lower extremity tenderness nor edema.  No joint effusions. Neurologic:  Normal speech and  language. No gross focal neurologic deficits are appreciated. No gait instability. Skin:  Skin is warm, dry and intact. No rash noted. Psychiatric: Mood and affect are normal. Speech and behavior are normal.  ____________________________________________   LABS (all labs ordered are listed, but only abnormal results are displayed)  Labs Reviewed  GASTROINTESTINAL PANEL BY PCR, STOOL (REPLACES STOOL CULTURE) - Abnormal; Notable for the following:       Result Value   Sapovirus (I, II, IV, and V) DETECTED (*)    All other components within normal limits  COMPREHENSIVE METABOLIC PANEL - Abnormal; Notable for the following:    AST 71 (*)    ALT 132 (*)    Alkaline Phosphatase 141 (*)    All other components within normal limits  CBC - Abnormal; Notable for the following:    WBC 13.4 (*)    All other components within normal limits  URINALYSIS, COMPLETE (UACMP) WITH MICROSCOPIC - Abnormal; Notable for the following:    Color, Urine AMBER (*)    APPearance CLOUDY (*)    Ketones, ur 5 (*)    Protein, ur 30 (*)    Leukocytes, UA TRACE (*)    Bacteria, UA MANY (*)    Squamous Epithelial / LPF TOO NUMEROUS TO COUNT (*)    Crystals PRESENT (*)    All other components within normal limits  C DIFFICILE QUICK SCREEN W PCR REFLEX  URINE CULTURE  LIPASE, BLOOD  PREGNANCY, URINE   ____________________________________________  EKG  None ____________________________________________  RADIOLOGY  Ct Abdomen Pelvis W Contrast  Result Date: 06/24/2016 CLINICAL DATA:  Generalized abdominal pain. Nausea, vomiting, diarrhea. EXAM: CT ABDOMEN AND PELVIS WITH CONTRAST TECHNIQUE: Multidetector CT imaging of the abdomen and pelvis was performed using the standard protocol following bolus administration of intravenous contrast. CONTRAST:  ISOVUE-300 IOPAMIDOL (ISOVUE-300) INJECTION 61% COMPARISON:  None. FINDINGS: Lower chest: The lung bases are clear. 1 cm cystic structures posterior to the  right atrium is partially included, likely a small pericardial or duplication cyst. Hepatobiliary: Decreased density adjacent to the falciform ligament consistent with focal fatty infiltration. Tiny subcentimeter subcapsular hypodensity in the inferior right lobe. Gallbladder physiologically distended, no calcified stone. No biliary dilatation. Pancreas: No ductal dilatation or inflammation. Spleen: Normal in size without focal abnormality. Adrenals/Urinary Tract: Adrenal glands are unremarkable. Kidneys are normal, without renal calculi, focal lesion, or hydronephrosis. Bladder is unremarkable. Stomach/Bowel: Stomach distended with enteric contrast, no evidence of gastric wall thickening no bowel obstruction or wall thickening. Liquid stool throughout the colon without colonic wall thickening. No pericolonic inflammation. Normal appendix. Vascular/Lymphatic: No significant vascular findings are present. Prominent ileocolic lymph nodes, likely reactive. No retroperitoneal adenopathy. Reproductive: Intrauterine device appropriately positioned in the endometrium. Ovaries symmetric and normal in size. Other: No free air, free fluid, or intra-abdominal fluid collection. Musculoskeletal: There are no acute or suspicious osseous abnormalities. IMPRESSION: 1. Liquid stool in the colon consistent with diarrheal process. No evidence of colonic wall thickening or inflammation. No bowel obstruction. 2. Prominent ileocolic lymph nodes are likely reactive or mild  mesenteric adenitis. Electronically Signed   By: Rubye OaksMelanie  Ehinger M.D.   On: 06/24/2016 05:13    ____________________________________________   PROCEDURES  Procedure(s) performed: None  Procedures  Critical Care performed: No  ____________________________________________   INITIAL IMPRESSION / ASSESSMENT AND PLAN / ED COURSE  Pertinent labs & imaging results that were available during my care of the patient were reviewed by me and considered in my  medical decision making (see chart for details).  21 year old female who presents with generalized abdominal cramping, nausea, vomiting and diarrhea. Laboratory urinalysis results remarkable for mild leukocytosis and mildly elevated transaminases which patient has had previously. Urinalysis with trace leukocytes and many bacteria in the setting of no urinary symptoms; will add urine culture. Will obtain stool cultures given patient's recent use of antibiotic. IV fluids initiated, IV antiemetic; will proceed with CT abdomen/pelvis to evaluate for intra-abdominal etiology.  Clinical Course as of Jun 25 723  Wed Jun 24, 2016  0705 Updated patient of negative C diff and CT imaging results. Biofire panel positive for Sapovirus. Recommend supportive treatment; will discharge home with prescription for ODT Zofran to use as needed and patient will follow-up with her PCP closely. Strict return precautions given. Patient verbalizes understanding and agrees with plan of care.  [JS]    Clinical Course User Index [JS] Irean HongSung, Jade J, MD     ____________________________________________   FINAL CLINICAL IMPRESSION(S) / ED DIAGNOSES  Final diagnoses:  Generalized abdominal pain  Nausea vomiting and diarrhea      NEW MEDICATIONS STARTED DURING THIS VISIT:  New Prescriptions   ONDANSETRON (ZOFRAN ODT) 4 MG DISINTEGRATING TABLET    Take 1 tablet (4 mg total) by mouth every 8 (eight) hours as needed for nausea or vomiting.     Note:  This document was prepared using Dragon voice recognition software and may include unintentional dictation errors.    Irean HongSung, Jade J, MD 06/24/16 [phone]

## 2016-06-25 LAB — URINE CULTURE

## 2016-10-13 ENCOUNTER — Encounter: Payer: Self-pay | Admitting: Gastroenterology

## 2016-11-03 ENCOUNTER — Encounter: Payer: Self-pay | Admitting: Gastroenterology

## 2016-11-03 ENCOUNTER — Ambulatory Visit (INDEPENDENT_AMBULATORY_CARE_PROVIDER_SITE_OTHER): Payer: BLUE CROSS/BLUE SHIELD | Admitting: Gastroenterology

## 2016-11-03 ENCOUNTER — Other Ambulatory Visit (INDEPENDENT_AMBULATORY_CARE_PROVIDER_SITE_OTHER): Payer: BLUE CROSS/BLUE SHIELD

## 2016-11-03 VITALS — BP 100/62 | HR 68 | Ht 62.0 in | Wt 143.4 lb

## 2016-11-03 DIAGNOSIS — R140 Abdominal distension (gaseous): Secondary | ICD-10-CM

## 2016-11-03 DIAGNOSIS — R945 Abnormal results of liver function studies: Secondary | ICD-10-CM | POA: Diagnosis not present

## 2016-11-03 DIAGNOSIS — R7989 Other specified abnormal findings of blood chemistry: Secondary | ICD-10-CM

## 2016-11-03 LAB — IBC PANEL
IRON: 134 ug/dL (ref 42–145)
Saturation Ratios: 29.6 % (ref 20.0–50.0)
Transferrin: 323 mg/dL (ref 212.0–360.0)

## 2016-11-03 LAB — IGA: IGA: 107 mg/dL (ref 68–378)

## 2016-11-03 LAB — FERRITIN: FERRITIN: 32.3 ng/mL (ref 10.0–291.0)

## 2016-11-03 NOTE — Progress Notes (Signed)
11/03/2016 Rose Howard [id number] 1995/03/25   HISTORY OF PRESENT ILLNESS:  His is a pleasant 21 year old female who is new to our practice. She presents here at the request of her PCP, Dr. Lacie Scotts, in order to discuss her elevated liver function tests. Her liver function tests have apparently been elevated for the past couple of years on and off. She was seen by Dr. Servando Snare in 2016 and underwent a liver biopsy that showed liver with focal mild spotty necrosis and minority of portal tracts containing minimal lymphocytic inflammation with scattered eosinophils.  Differential included reaction to drug, viral hepatitis, and early autoimmune process (which was thought to be less likely).  I have labs from April 2018 that showed normal LFTs with a total bili of 0.5, alkaline phosphatase 104, AST 23, ALT slightly elevated at 63. Then, in June 2018 she had total bilirubin of 0.9, alkaline phosphatase 141, AST 71, ALT 132. Most recently in September 2018 she had total bili of 0.5, alkaline phosphatase 125, AST 26, ALT 67.  GGT was also checked and was 200. She had a recent abdominal ultrasound that was normal.  Her only complaint today is of some abdominal bloating. She says that she's always had some issues with bloating that has been contributed to irritable bowel syndrome, but since her infectious gastroenteritis with Sapovirus in June of this year it seems to be much worse. She says that no matter what she eats her entire abdomen feels bloated.She denies specific abdominal pain.  Occasionally has loose stools.  No rectal bleeding.   Past Medical History:  Diagnosis Date  . Abnormal liver enzymes   . Anxiety   . Depression   . Dysmenorrhea   . GERD (gastroesophageal reflux disease)    Past Surgical History:  Procedure Laterality Date  . BREAST SURGERY Bilateral 2014   Breast Reduction  . LIVER BIOPSY  08/10/14    reports that she has never smoked. She has never used smokeless tobacco.  She reports that she drinks about 0.6 oz of alcohol per week . She reports that she does not use drugs. family history includes Alcohol abuse in her father; Heart disease in her father; Hypercholesterolemia in her mother. No Known Allergies    Outpatient Encounter Prescriptions as of 11/03/2016  Medication Sig  . Levonorgestrel (SKYLA) 13.5 MG IUD by Intrauterine route.  . ondansetron (ZOFRAN ODT) 4 MG disintegrating tablet Take 1 tablet (4 mg total) by mouth every 8 (eight) hours as needed for nausea or vomiting.  . [DISCONTINUED] ondansetron (ZOFRAN ODT) 4 MG disintegrating tablet Take 1 tablet (4 mg total) by mouth every 8 (eight) hours as needed for nausea or vomiting.   No facility-administered encounter medications on file as of 11/03/2016.      REVIEW OF SYSTEMS  : All other systems reviewed and negative except where noted in the History of Present Illness.   PHYSICAL EXAM: BP 100/62   Pulse 68   Ht  (1.575 m)   Wt 143 lb 6.4 oz (65 kg)   BMI 26.23 kg/m  General: Well developed white female in no acute distress Head: Normocephalic and atraumatic Eyes:  Sclerae anicteric, conjunctiva pink. Ears: Normal auditory acuity Lungs: Clear throughout to auscultation; no increased WOB. Heart: Regular rate and rhythm; no M/R/G Abdomen: Soft, non-distended. Normal bowel sounds.  Non-tender. Musculoskeletal: Symmetrical with no gross deformities  Skin: No lesions on visible extremities Extremities: No edema  Neurological: Alert oriented x 4, grossly non-focal Psychological:  Alert and cooperative. Normal mood and affect  ASSESSMENT AND PLAN: *Elevated LFT's:  Apparently have been elevated for the past 2 years.  Biopsy results suggest reaction to drug, viral hepatitis, or early autoimmune process (which was thought to be less likely).  Recent ultrasound normal.  Will check viral hepatitis studies, ANA, AMA, ASMA, celiac labs, iron studies. *Bloating:  Has been ongoing for a  while, but much worse since her infectious gastroenteritis with Sapovirus in June.  Will try probiotic for one to two months.  Could consider a course of xifaxan.   CC:  Evelene Croon, MD

## 2016-11-03 NOTE — Progress Notes (Signed)
Assessment and plans reviewed. If no obvious etiology for chronically elevated liver tests identified, would consider tertiary hepatology opinion including her biopsies for their review

## 2016-11-03 NOTE — Patient Instructions (Signed)
If you are age 23 or older, your body mass index should be between 23-30. Your Body mass index is 26.23 kg/m. If this is out of the aforementioned range listed, please consider follow up with your Primary Care Provider.  If you are age 60 or younger, your body mass index should be between 19-25. Your Body mass index is 26.23 kg/m. If this is out of the aformentioned range listed, please consider follow up with your Primary Care Provider.   Your physician has requested that you go to the basement for lab work before leaving today.  Please purchase an over the counter probiotic such as Florastor, VSL #3 or Culturelle. Please take for 1-2 months.  Thank you.

## 2016-11-08 LAB — ANTI-SMOOTH MUSCLE ANTIBODY, IGG

## 2016-11-08 LAB — HEPATITIS C ANTIBODY
HEP C AB: NONREACTIVE
SIGNAL TO CUT-OFF: 0.02 (ref <1.00)

## 2016-11-08 LAB — TISSUE TRANSGLUTAMINASE, IGA: (tTG) Ab, IgA: 1 U/mL

## 2016-11-08 LAB — ANA: ANA: POSITIVE — AB

## 2016-11-08 LAB — HEPATITIS B SURFACE ANTIGEN: Hepatitis B Surface Ag: NONREACTIVE

## 2016-11-08 LAB — MITOCHONDRIAL ANTIBODIES: Mitochondrial M2 Ab, IgG: <=20 U

## 2016-11-08 LAB — ANTI-NUCLEAR AB-TITER (ANA TITER): ANA Titer 1: 1:80 {titer} — ABNORMAL HIGH

## 2016-11-08 LAB — HEPATITIS B SURFACE ANTIBODY,QUALITATIVE: Hep B S Ab: REACTIVE — AB

## 2017-01-04 ENCOUNTER — Other Ambulatory Visit (HOSPITAL_COMMUNITY): Payer: Self-pay | Admitting: Nurse Practitioner

## 2017-01-04 DIAGNOSIS — R748 Abnormal levels of other serum enzymes: Secondary | ICD-10-CM

## 2017-01-04 DIAGNOSIS — K7589 Other specified inflammatory liver diseases: Secondary | ICD-10-CM

## 2017-01-11 ENCOUNTER — Other Ambulatory Visit: Payer: Self-pay | Admitting: Radiology

## 2017-01-14 ENCOUNTER — Encounter (HOSPITAL_COMMUNITY): Payer: Self-pay | Admitting: Emergency Medicine

## 2017-01-14 ENCOUNTER — Encounter (HOSPITAL_COMMUNITY): Payer: Self-pay

## 2017-01-14 ENCOUNTER — Emergency Department (HOSPITAL_COMMUNITY): Payer: BLUE CROSS/BLUE SHIELD

## 2017-01-14 ENCOUNTER — Emergency Department (HOSPITAL_COMMUNITY)
Admission: EM | Admit: 2017-01-14 | Discharge: 2017-01-15 | Disposition: A | Discharge status: Home / Self Care | Payer: BLUE CROSS/BLUE SHIELD | Attending: Emergency Medicine | Admitting: Emergency Medicine

## 2017-01-14 ENCOUNTER — Other Ambulatory Visit: Payer: Self-pay

## 2017-01-14 ENCOUNTER — Ambulatory Visit (HOSPITAL_COMMUNITY)
Admission: RE | Admit: 2017-01-14 | Discharge: 2017-01-14 | Disposition: A | Discharge status: Home / Self Care | Payer: BLUE CROSS/BLUE SHIELD | Source: Ambulatory Visit | Attending: Nurse Practitioner | Admitting: Nurse Practitioner

## 2017-01-14 DIAGNOSIS — K219 Gastro-esophageal reflux disease without esophagitis: Secondary | ICD-10-CM

## 2017-01-14 DIAGNOSIS — R748 Abnormal levels of other serum enzymes: Secondary | ICD-10-CM | POA: Insufficient documentation

## 2017-01-14 DIAGNOSIS — F419 Anxiety disorder, unspecified: Secondary | ICD-10-CM | POA: Insufficient documentation

## 2017-01-14 DIAGNOSIS — F329 Major depressive disorder, single episode, unspecified: Secondary | ICD-10-CM | POA: Insufficient documentation

## 2017-01-14 DIAGNOSIS — Z9889 Other specified postprocedural states: Secondary | ICD-10-CM | POA: Insufficient documentation

## 2017-01-14 DIAGNOSIS — R7989 Other specified abnormal findings of blood chemistry: Secondary | ICD-10-CM

## 2017-01-14 DIAGNOSIS — Z811 Family history of alcohol abuse and dependence: Secondary | ICD-10-CM

## 2017-01-14 DIAGNOSIS — Z8249 Family history of ischemic heart disease and other diseases of the circulatory system: Secondary | ICD-10-CM | POA: Insufficient documentation

## 2017-01-14 DIAGNOSIS — Z79899 Other long term (current) drug therapy: Secondary | ICD-10-CM | POA: Insufficient documentation

## 2017-01-14 DIAGNOSIS — X58XXXA Exposure to other specified factors, initial encounter: Secondary | ICD-10-CM | POA: Insufficient documentation

## 2017-01-14 DIAGNOSIS — N946 Dysmenorrhea, unspecified: Secondary | ICD-10-CM

## 2017-01-14 DIAGNOSIS — R079 Chest pain, unspecified: Secondary | ICD-10-CM | POA: Diagnosis present

## 2017-01-14 DIAGNOSIS — S36118A Other injury of liver, initial encounter: Secondary | ICD-10-CM

## 2017-01-14 DIAGNOSIS — K7589 Other specified inflammatory liver diseases: Secondary | ICD-10-CM | POA: Insufficient documentation

## 2017-01-14 DIAGNOSIS — R0789 Other chest pain: Secondary | ICD-10-CM | POA: Diagnosis not present

## 2017-01-14 LAB — CBC
HEMATOCRIT: 41.8 % (ref 36.0–46.0)
HEMATOCRIT: 41.6 % (ref 36.0–46.0)
Hemoglobin: 14.4 g/dL (ref 12.0–15.0)
Hemoglobin: 14.1 g/dL (ref 12.0–15.0)
MCH: 30.3 pg (ref 26.0–34.0)
MCH: 30.2 pg (ref 26.0–34.0)
MCHC: 34.4 g/dL (ref 30.0–36.0)
MCHC: 33.9 g/dL (ref 30.0–36.0)
MCV: 88 fL (ref 78.0–100.0)
MCV: 89.1 fL (ref 78.0–100.0)
Platelets: 280 10*3/uL (ref 150–400)
Platelets: 353 10*3/uL (ref 150–400)
RBC: 4.75 MIL/uL (ref 3.87–5.11)
RBC: 4.67 MIL/uL (ref 3.87–5.11)
RDW: 12.4 % (ref 11.5–15.5)
RDW: 12.9 % (ref 11.5–15.5)
WBC: 7.8 10*3/uL (ref 4.0–10.5)
WBC: 12.5 10*3/uL — ABNORMAL HIGH (ref 4.0–10.5)

## 2017-01-14 LAB — BASIC METABOLIC PANEL
Anion gap: 10 (ref 5–15)
BUN: 10 mg/dL (ref 6–20)
CO2: 25 mmol/L (ref 22–32)
Calcium: 9.3 mg/dL (ref 8.9–10.3)
Chloride: 103 mmol/L (ref 101–111)
Creatinine, Ser: 0.81 mg/dL (ref 0.44–1.00)
GFR calc Af Amer: >60 mL/min (ref >60)
GLUCOSE: 98 mg/dL (ref 65–99)
POTASSIUM: 3.1 mmol/L — AB (ref 3.5–5.1)
Sodium: 138 mmol/L (ref 135–145)

## 2017-01-14 LAB — I-STAT TROPONIN, ED: Troponin i, poc: 0 ng/mL (ref 0.00–0.08)

## 2017-01-14 LAB — APTT: APTT: 28 s (ref 24–36)

## 2017-01-14 LAB — I-STAT BETA HCG BLOOD, ED (MC, WL, AP ONLY): I-stat hCG, quantitative: <5 m[IU]/mL (ref <5)

## 2017-01-14 LAB — PROTIME-INR
INR: 0.95
Prothrombin Time: 12.6 seconds (ref 11.4–15.2)

## 2017-01-14 MED ORDER — FENTANYL CITRATE (PF) 100 MCG/2ML IJ SOLN
INTRAMUSCULAR | Status: AC | PRN
  Administered 2017-01-14 (×2): 25 ug via INTRAVENOUS
  Administered 2017-01-14: 50 ug via INTRAVENOUS

## 2017-01-14 MED ORDER — LIDOCAINE HCL (CARDIAC) 20 MG/ML IV SOLN
INTRAVENOUS | Status: AC
  Filled 2017-01-14: qty 10

## 2017-01-14 MED ORDER — GELATIN ABSORBABLE 12-7 MM EX MISC
CUTANEOUS | Status: AC
  Filled 2017-01-14: qty 1

## 2017-01-14 MED ORDER — MIDAZOLAM HCL 2 MG/2ML IJ SOLN
INTRAMUSCULAR | Status: AC | PRN
  Administered 2017-01-14 (×2): 0.5 mg via INTRAVENOUS
  Administered 2017-01-14: 1 mg via INTRAVENOUS

## 2017-01-14 MED ORDER — SODIUM CHLORIDE 0.9 % IV SOLN: INTRAVENOUS | Status: DC

## 2017-01-14 MED ORDER — LIDOCAINE HCL (PF) 1 % IJ SOLN
INTRAMUSCULAR | Status: AC
  Filled 2017-01-14: qty 10

## 2017-01-14 MED ORDER — FENTANYL CITRATE (PF) 100 MCG/2ML IJ SOLN
INTRAMUSCULAR | Status: AC
  Filled 2017-01-14: qty 2

## 2017-01-14 MED ORDER — SODIUM CHLORIDE 0.9 % IV SOLN
INTRAVENOUS | Status: AC | PRN
  Administered 2017-01-14: 10 mL/h via INTRAVENOUS

## 2017-01-14 MED ORDER — MIDAZOLAM HCL 2 MG/2ML IJ SOLN
INTRAMUSCULAR | Status: AC
  Filled 2017-01-14: qty 2

## 2017-01-14 NOTE — H&P (Signed)
Chief Complaint: Patient was seen in consultation today for random liver biopsy at the request of Drazek,Dawn  Referring Physician(s): Drazek,Dawn  Supervising Physician: Ruel FavorsShick, Trevor  [facility]  History of Present Illness: Rose Howard is a 21 y.o. female   Elevated liver enzymes Has had previous liver core biopsy 2016 DIAGNOSIS:  A. 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  Told no more needs Pt sought out second opinion recently Now for random renal biopsy per Annamarie Majorawn Drazek  Past Medical History:  Diagnosis Date  . Abnormal liver enzymes   . Anxiety   . Depression   . Dysmenorrhea   . GERD (gastroesophageal reflux disease)     Past Surgical History:  Procedure Laterality Date  . BREAST SURGERY Bilateral 2014   Breast Reduction  . LIVER BIOPSY  08/10/14    Allergies: Patient has no known allergies.  Medications: Prior to Admission medications   Medication Sig Start Date End Date Taking? Authorizing Provider  Cholecalciferol (VITAMIN D3 PO) Take 1 tablet by mouth daily.   Yes [provider]  Levonorgestrel (SKYLA) 13.5 MG IUD 1 Device by Intrauterine route once.    Yes [provider]  ondansetron (ZOFRAN ODT) 4 MG disintegrating tablet Take 1 tablet (4 mg total) by mouth every 8 (eight) hours as needed for nausea or vomiting. 06/24/16   Irean HongSung, Jade J, MD     Family History  Problem Relation Age of Onset  . Heart disease Father   . Alcohol abuse Father   . Hypercholesterolemia Mother     Social History   Socioeconomic History  . Marital status: Married    Spouse name: None  . Number of children: None  . Years of education: None  . Highest education level: None  Social Needs  . Financial resource strain: None  . Food insecurity - worry: None  . Food insecurity  - inability: None  . Transportation needs - medical: None  . Transportation needs - non-medical: None  Occupational History  . None  Tobacco Use  . Smoking status: Never Smoker  . Smokeless tobacco: Never Used  Substance and Sexual Activity  . Alcohol use: Yes    Alcohol/week: 0.6 oz    Types: 1 Glasses of wine per week  . Drug use: No  . Sexual activity: Yes    Birth control/protection: IUD    Comment: Skyla inserted 06/13/2014  Other Topics Concern  . None  Social History Narrative  . None    Review of Systems: A 12 point ROS discussed and pertinent positives are indicated in the HPI above.  All other systems are negative.  Review of Systems  Constitutional: Negative for activity change, fatigue and fever.  Respiratory: Negative for cough and shortness of breath.   Gastrointestinal: Negative for abdominal pain.  Musculoskeletal: Negative for back pain.  Neurological: Negative for weakness.  Psychiatric/Behavioral: Negative for behavioral problems and confusion.    Vital Signs: BP 116/74 (BP Location: Right Arm)   Pulse 65   Temp 97.7 F (36.5 C) (Oral)   Ht 5\' 2"  (1.575 m)   Wt 145 lb (65.8 kg)   SpO2 99%   BMI 26.52 kg/m   Physical Exam  Constitutional: She is oriented to person, place, and time.  Cardiovascular: Normal rate, regular rhythm and normal heart sounds.  Pulmonary/Chest: Effort normal and breath sounds normal.  Abdominal: Soft. Bowel sounds are normal.  Musculoskeletal: Normal range of motion.  Neurological: She is alert and oriented to person, place, and time.  Skin: Skin is warm and dry.  Psychiatric: She has a normal mood and affect. Her behavior is normal. Judgment and thought content normal.  Nursing note and vitals reviewed.   Imaging: No results found.  Labs:  CBC: Recent Labs    06/23/16 2315  WBC 13.4*  HGB 14.9  HCT 42.4  PLT 359    COAGS: No results for input(s): INR, APTT in the last 8760 hours.  BMP: Recent Labs     06/23/16 2315  NA 140  K 3.5  CL 102  CO2 27  GLUCOSE 99  BUN 13  CALCIUM 9.9  CREATININE 0.72  GFRNONAA >60  GFRAA >60    LIVER FUNCTION TESTS: Recent Labs    06/23/16 2315  BILITOT 0.9  AST 71*  ALT 132*  ALKPHOS 141*  PROT 7.9  ALBUMIN 4.8    TUMOR MARKERS: No results for input(s): AFPTM, CEA, CA199, CHROMGRNA in the last 8760 hours.  Assessment and Plan:  Persitent elevated liver enzymes Now for random liver biopsy  Risks and benefits discussed with the patient including, but not limited to bleeding, infection, damage to adjacent structures or low yield requiring additional tests. All of the patient's questions were answered, patient is agreeable to proceed. Consent signed and in chart.   Thank you for this interesting consult.  I greatly enjoyed meeting [place] H. [place] and look forward to participating in their care.  A copy of this report was sent to the requesting provider on this date.  Electronically Signed: Robet LeuURPIN,Tanya Marvin A, PA-C 01/14/2017, 12:38 PM   I spent a total of  30 Minutes   in face to face in clinical consultation, greater than 50% of which was counseling/coordinating care for random liver core biopsy

## 2017-01-14 NOTE — ED Triage Notes (Signed)
Patient reports intermittent mid chest pain radiating to upper back onset this evening , denies SOB , no nausea or diaphoresis , s/p liver biopsy this afternoon .

## 2017-01-14 NOTE — Discharge Instructions (Addendum)
Liver Biopsy, Care After °Refer to this sheet in the next few weeks. These instructions provide you with information on caring for yourself after your procedure. Your health care provider may also give you more specific instructions. Your treatment has been planned according to current medical practices, but problems sometimes occur. Call your health care provider if you have any problems or questions after your procedure. °What can I expect after the procedure? °After your procedure, it is typical to have the following: °· A small amount of discomfort in the area where the biopsy was done and in the right shoulder or shoulder blade. °· A small amount of bruising around the area where the biopsy was done and on the skin over the liver. °· Sleepiness and fatigue for the rest of the day. ° °Follow these instructions at home: °· Rest at home for 1-2 days or as directed by your health care provider. °· Have a friend or family member stay with you for at least 24 hours. °· Because of the medicines used during the procedure, you should not do the following things in the first 24 hours: °? Drive. °? Use machinery. °? Be responsible for the care of other people. °? Sign legal documents. °? Take a bath or shower. °· There are many different ways to close and cover an incision, including stitches, skin glue, and adhesive strips. Follow your health care provider's instructions on: °? Incision care. °? Bandage (dressing) changes and removal. °? Incision closure removal. °· Do not drink alcohol in the first week. °· Do not lift more than 5 pounds or play contact sports for 2 weeks after this test. °· Take medicines only as directed by your health care provider. Do not take medicine containing aspirin or non-steroidal anti-inflammatory medicines such as ibuprofen for 1 week after this test. °· It is your responsibility to get your test results. °Contact a health care provider if: °· You have increased bleeding from an incision  that results in more than a small spot of blood. °· You have redness, swelling, or increasing pain in any incisions. °· You notice a discharge or a bad smell coming from any of your incisions. °· You have a fever or chills. °Get help right away if: °· You develop swelling, bloating, or pain in your abdomen. °· You become dizzy or faint. °· You develop a rash. °· You are nauseous or vomit. °· You have difficulty breathing, feel short of breath, or feel faint. °· You develop chest pain. °· You have problems with your speech or vision. °· You have trouble balancing or moving your arms or legs. °This information is not intended to replace advice given to you by your health care provider. Make sure you discuss any questions you have with your health care provider. °Document Released: 07/25/2004 Document Revised: 06/13/2015 Document Reviewed: 03/03/2013 °Elsevier Interactive Patient Education © 2018 [employer] °Moderate Conscious Sedation, Adult, Care After °These instructions provide you with information about caring for yourself after your procedure. Your health care provider may also give you more specific instructions. Your treatment has been planned according to current medical practices, but problems sometimes occur. Call your health care provider if you have any problems or questions after your procedure. °What can I expect after the procedure? °After your procedure, it is common: °· To feel sleepy for several hours. °· To feel clumsy and have poor balance for several hours. °· To have poor judgment for several hours. °· To vomit if you eat   too soon. ° °Follow these instructions at home: °For at least 24 hours after the procedure: ° °· Do not: °? Participate in activities where you could fall or become injured. °? Drive. °? Use heavy machinery. °? Drink alcohol. °? Take sleeping pills or medicines that cause drowsiness. °? Make important decisions or sign legal documents. °? Take care of children on your  own. °· Rest. °Eating and drinking °· Follow the diet recommended by your health care provider. °· If you vomit: °? Drink water, juice, or soup when you can drink without vomiting. °? Make sure you have little or no nausea before eating solid foods. °General instructions °· Have a responsible adult stay with you until you are awake and alert. °· Take over-the-counter and prescription medicines only as told by your health care provider. °· If you smoke, do not smoke without supervision. °· Keep all follow-up visits as told by your health care provider. This is important. °Contact a health care provider if: °· You keep feeling nauseous or you keep vomiting. °· You feel light-headed. °· You develop a rash. °· You have a fever. °Get help right away if: °· You have trouble breathing. °This information is not intended to replace advice given to you by your health care provider. Make sure you discuss any questions you have with your health care provider. °Document Released: 10/26/2012 Document Revised: 06/10/2015 Document Reviewed: 04/27/2015 °Elsevier Interactive Patient Education © 2018 [employer] ° °

## 2017-01-14 NOTE — ED Notes (Addendum)
Patient was brought back to room and was given a gown to change in to. Patient stated , "I feel better now, I think it was just anxiety". Patient does not want to change into gown or to be placed on the monitor. -RN notified

## 2017-01-14 NOTE — Procedures (Signed)
INCREASED LFTS  S/P US LIVER BX  NO COMP STABLE EBL MIN PATH PENDING FULL REPORT IN PACS

## 2017-01-14 NOTE — ED Provider Notes (Signed)
[facility] EMERGENCY DEPARTMENT Provider Note   CSN: [id number] Arrival date & time: 01/14/17  1934     History   Chief Complaint Chief Complaint  Patient presents with  . Chest Pain    HPI Rose Howard is a 21 y.o. female.  The history is provided by the patient and medical records.  Chest Pain   Associated symptoms include shortness of breath.    21 y.o. F with hx of elevated LFT's for the past 2 years, anxiety, depression, GERD, presenting to the ED for chest pain.  Patient underwent liver biopsy this morning for further eval of her elevated LFT's.  States procedure went well, no complications.  States about an hour after she left the hospital post procedure she developed mid-sternal, central chest pain that felt like something went straight through her chest into the back.  States pain has continued to be intermittent since then without noted alleviating or exacerbating factors.  No fever, cough, hemoptysis, nausea, vomiting.  States her upper abdomen feels sore at the site of her biopsy but nothing severe.  States she was told post procedure if she developed chest pain or shortness of breath she was to come to the ER immediately.  Patient states she feels like her symptoms may have been from anxiety, however she just wanted to be evaluated to be sure.  She has no history of DVT or PE.  She currently has IUD.  No known cardiac history.  She is not a smoker.  Past Medical History:  Diagnosis Date  . Abnormal liver enzymes   . Anxiety   . Depression   . Dysmenorrhea   . GERD (gastroesophageal reflux disease)     Patient Active Problem List   Diagnosis Date Noted  . Elevated LFTs 11/03/2016  . Bloating 11/03/2016  . Abnormal liver enzymes 07/31/2014  . Acid reflux 07/31/2014  . 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  . Cardiac murmur 07/31/2014  . Avitaminosis D 07/31/2014  . HLD (hyperlipidemia) 07/31/2014  . Elevated carcinoembryonic antigen 07/31/2014  .  Anxiety and depression 07/12/2014  . Dysmenorrhea 07/12/2014  . Contraception management 07/12/2014    Past Surgical History:  Procedure Laterality Date  . BREAST SURGERY Bilateral 2014   Breast Reduction  . LIVER BIOPSY  08/10/14    OB History    Gravida Para Term Preterm AB Living   0 0 0 0 0 0   SAB TAB Ectopic Multiple Live Births   0 0 0 0         Home Medications    Prior to Admission medications   Medication Sig Start Date End Date Taking? Authorizing Provider  Levonorgestrel (SKYLA) 13.5 MG IUD 1 Device by Intrauterine route once.    Yes [provider]  ondansetron (ZOFRAN ODT) 4 MG disintegrating tablet Take 1 tablet (4 mg total) by mouth every 8 (eight) hours as needed for nausea or vomiting. 06/24/16  Yes Irean Hong, MD    Family History Family History  Problem Relation Age of Onset  . Heart disease Father   . Alcohol abuse Father   . Hypercholesterolemia Mother     Social History Social History   Tobacco Use  . Smoking status: Never Smoker  . Smokeless tobacco: Never Used  Substance Use Topics  . Alcohol use: Yes    Alcohol/week: 0.6 oz    Types: 1 Glasses of wine per week  . Drug use: No     Allergies   Patient has  no known allergies.   Review of Systems Review of Systems  Respiratory: Positive for shortness of breath.   Cardiovascular: Positive for chest pain.  All other systems reviewed and are negative.    Physical Exam Updated Vital Signs BP 140/80 (BP Location: Right Arm)   Pulse 86   Temp 99.4 F (37.4 C) (Oral)   Resp 18   Ht 5\' 2"  (1.575 m)   Wt 65.8 kg (145 lb)   SpO2 100%   BMI 26.52 kg/m   Physical Exam  Constitutional: She is oriented to person, place, and time. She appears well-developed and well-nourished.  HENT:  Head: Normocephalic and atraumatic.  Mouth/Throat: Oropharynx is clear and moist.  Eyes: Conjunctivae and EOM are normal. Pupils are equal, round, and reactive to light.  Neck: Normal range  of motion.  Cardiovascular: Regular rhythm and normal heart sounds. Tachycardia present.  Pulmonary/Chest: Effort normal and breath sounds normal.  Abdominal: Soft. Bowel sounds are normal.  Small biopsy site of right upper abdomen appears clean, no significant tenderness of this area; no overlying skin changes, bleeding, or signs of infection  Musculoskeletal: Normal range of motion.  Neurological: She is alert and oriented to person, place, and time.  Skin: Skin is warm and dry.  Psychiatric: She has a normal mood and affect.  Nursing note and vitals reviewed.    ED Treatments / Results  Labs (all labs ordered are listed, but only abnormal results are displayed) Labs Reviewed  BASIC METABOLIC PANEL - Abnormal; Notable for the following components:      Result Value   Potassium 3.1 (*)    All other components within normal limits  CBC - Abnormal; Notable for the following components:   WBC 12.5 (*)    All other components within normal limits  I-STAT TROPONIN, ED  I-STAT BETA HCG BLOOD, ED ([facility], [facility], [facility] ONLY)    EKG  EKG Interpretation  Date/Time:  Thursday January 14 2017 19:36:27 EST Ventricular Rate:  106 PR Interval:  126 QRS Duration: 84 QT Interval:  322 QTC Calculation: 427 R Axis:   86 Text Interpretation:  Sinus tachycardia Right atrial enlargement Borderline ECG No old tracing to compare Confirmed by Dione BoozeGlick, David ([id number]) on 01/14/2017 11:03:21 PM       Radiology Dg Chest 2 View  Result Date: 01/14/2017 CLINICAL DATA:  21 year old female with chest pain. EXAM: CHEST  2 VIEW COMPARISON:  None. FINDINGS: The heart size and mediastinal contours are within normal limits. Both lungs are clear. The visualized skeletal structures are unremarkable. IMPRESSION: No active cardiopulmonary disease. Electronically Signed   By: Elgie CollardArash  Radparvar M.D.   On: 01/14/2017 20:28   [place] Biopsy (liver)  Result Date: 01/14/2017 INDICATION: Elevated LFTs, cholestatic hepatitis EXAM:  ULTRASOUND GUIDED CORE BIOPSY OF RIGHT HEPATIC LOBE MEDICATIONS: 1% lidocaine local ANESTHESIA/SEDATION: Fentanyl 100 mcg IV; Versed 2.0 mg IV Moderate Sedation Time:  12 minutes The patient was continuously monitored during the procedure by the interventional radiology nurse under my direct supervision. PROCEDURE: The procedure, risks, benefits, and alternatives were explained to the patient. Questions regarding the procedure were encouraged and answered. The patient understands and consents to the procedure. Preliminary ultrasound performed. The right hepatic lobe was demonstrated in the mid axillary line through a lower intercostal space. Overlying skin marked. Under sterile conditions and local anesthesia, a 17 gauge 6.8 cm access needle was advanced percutaneously into the right hepatic lobe. Needle position confirmed with ultrasound. Images obtained for documentation. 3 18 gauge core  biopsies obtained. Samples placed in formalin. Needle tract occluded with Gel-Foam. Postprocedure imaging demonstrates no hemorrhage or hematoma. Patient tolerated the biopsy well. COMPLICATIONS: None immediate. FINDINGS: Imaging confirms percutaneous access of the right hepatic lobe for random core biopsy IMPRESSION: Successful ultrasound right hepatic lobe random core biopsy Electronically Signed   By: Judie PetitM.  Shick M.D.   On: 01/14/2017 15:22    Procedures Procedures (including critical care time)  Medications Ordered in ED Medications - No data to display   Initial Impression / Assessment and Plan / ED Course  I have reviewed the triage vital signs and the nursing notes.  Pertinent labs & imaging results that were available during my care of the patient were reviewed by me and considered in my medical decision making (see chart for details).  21 year old female presenting to the ED with chest pain and shortness of breath.  Underwent liver biopsy this morning for further evaluation of persistently elevated LFTs of  the past 2 years.  States this began about an hour post procedure and has been intermittent since.  Midsternal in nature.  No known cardiac history.  No history of DVT or PE.  She does have Skyla IUD in place.  EKG with sinus tachycardia, no acute ischemia noted.  Screening lab work overall reassuring aside from mild hypokalemia.  Chest x-ray is clear.  On my examination patient still with continued tachycardia.  She does not appear overly anxious to be exacerbating this.  Her lungs are overall clear.  Given her symptoms as well as recent procedure and use of birth control, have recommended CTA of the chest to exclude PE.  I discussed with her this will also help evaluate the liver although she is not having any significant abdominal pains I do not suspect there is an acute complication with this.  Patient acknowledged understanding.  11:43 PM Notified by RN that after attempted IV placement patient now wants to leave.  States she feels her symptoms were all due to anxiety as she started laughing which made her stomach hurt a little and she feels this set off a "chain reaction" of anxiousness.  Currently her heart rate is normal at 88.  I have discussed with her that anxiety could be causing/contributing to her symptoms, however I still recommend CT of her chest to ensure no PE given her risk factors.  She continues to express desire to go home.  I have discussed with her risks of not obtaining this scan including undetected PE, respiratory failure, rapid decompensation, or even death.  She acknowledged these risks but still wishes to leave.  She is of sound mind and capable of making her own medical decisions at this time.  I strongly encouraged her to monitor symptoms at home and return here immediately if any recurrence, new/changing symptoms.  Husband at the bedside voiced understanding of this and will monitor her as well.  She understands to follow-up closely with her primary care doctor.  Final Clinical  Impressions(s) / ED Diagnoses   Final diagnoses:  Other chest pain    ED Discharge Orders    None       Garlon HatchetSanders, Gaelen Brager M, PA-C 01/15/17 [place]    Isaacs, Cameron, MD 01/15/17 [phone]

## 2017-01-14 NOTE — Discharge Instructions (Signed)
We have recommended CT scan of your chest today, however you have decided to go home.  We discussed risks of leaving without this testing including undetected pulmonary embolism, respiratory failure, decompensation, death. Should you have any recurrence of symptoms or any new/changing symptoms, you need to come back to the emergency department immediately for evaluation.

## 2017-01-19 NOTE — L&D Delivery Note (Signed)
Delivery Summary for Glorious PeachHolly H Kosmicki  Labor Events:   Preterm labor:   Rupture date: 12/08/2017  Rupture time: 7:35 PM  Rupture type: Artificial Intact  Fluid Color:   Induction:   Augmentation:   Complications:   Cervical ripening:          Delivery:   Episiotomy:   Lacerations:   Repair suture:   Repair # of packets:   Blood loss (ml): 515   Information for the patient's newborn:  Grayce SessionsRuffolo, Boy Elisabella [[id number]][[id number]]    Delivery 12/08/2017 10:32 PM by  Vaginal, Vacuum (Extractor) Sex:  female Gestational Age: 7643w4d Delivery Clinician:   Living?:         APGARS  One minute Five minutes Ten minutes  Skin color:        Heart rate:        Grimace:        Muscle tone:        Breathing:        Totals: 8  9      Presentation/position:      Resuscitation:   Cord information:    Disposition of cord blood:     Blood gases sent?  Complications:   Placenta: Delivered:       appearance Newborn Measurements: Weight: 7 lb 0.9 oz (3200 g)  Height: 19.49"  Head circumference:    Chest circumference:    Other providers:    Additional  information: Forceps:   Vacuum:   Breech:   Observed anomalies       Operative Delivery Note At 10:32 PM a viable and healthy female was delivered via Vaginal, Vacuum Investment banker, operational(Extractor).  Presentation: vertex; Position: Left,, Occiput,, Anterior; Station: +3. Vacuum was applied and with moderate maternal effort, the fetal head was brought to +4 station, after 3 pushes. No pop-offs occurred.  Indications for vacuum: non-reassuring fetal tracing in the third stage of labor.    Verbal consent: obtained from patient.  Risks and benefits discussed in detail.  Risks include, but are not limited to the risks of anesthesia, bleeding, infection, damage to maternal tissues, fetal cephalhematoma.  There is also the risk of inability to effect vaginal delivery of the head, or shoulder dystocia that cannot be resolved by established maneuvers, leading to the need for  emergency cesarean section.  APGAR: 8, 9; weight 3200 grams.   Placenta status: spontaneously removed, intact.   Cord: 3-vessel with the following complications: nuchal cord x 1, reducible.  Cord pH: not obtained.  Delayed cord clamping observed for > 2 minutes.   Anesthesia:  Epidural Instruments: Kiwi Vacuum  Episiotomy: None Lacerations: Periurethral;1st degree Suture Repair: 3.0 vicryl Est. Blood Loss (mL):  515  Mom to postpartum.  Baby to Couplet care / Skin to Skin.  Hildred Lasernika Madoline Bhatt 12/08/2017, 10:56 PM

## 2017-01-21 ENCOUNTER — Encounter: Payer: Self-pay | Admitting: Obstetrics and Gynecology

## 2017-01-21 ENCOUNTER — Ambulatory Visit (INDEPENDENT_AMBULATORY_CARE_PROVIDER_SITE_OTHER): Payer: BLUE CROSS/BLUE SHIELD | Admitting: Obstetrics and Gynecology

## 2017-01-21 VITALS — BP 110/57 | HR 79 | Ht 62.0 in | Wt 141.9 lb

## 2017-01-21 DIAGNOSIS — Z30432 Encounter for removal of intrauterine contraceptive device: Secondary | ICD-10-CM

## 2017-01-21 NOTE — Progress Notes (Signed)
     GYNECOLOGY OFFICE PROCEDURE NOTE  Rose Howard is a 22 y.o. G0P0000 here for Skyla IUD removal. No GYN concerns.  Desiring to conceive.   IUD Removal  Patient identified, informed consent performed, consent signed.  Patient was in the dorsal lithotomy position, normal external genitalia was noted.  A speculum was placed in the patient's vagina, normal discharge was noted, no lesions. The cervix was visualized, no lesions, no abnormal discharge.  The strings of the IUD were grasped and pulled using ring forceps. The IUD was removed in its entirety. Patient tolerated the procedure well.    Patient plans for pregnancy soon and she was told to avoid teratogens, take PNV and folic acid.  Routine preventative health maintenance measures emphasized.   Hildred Laserherry, Merci Walthers, MD [facility]

## 2017-01-21 NOTE — Patient Instructions (Addendum)
Preparing for Pregnancy If you are considering becoming pregnant, make an appointment to see your regular health care provider to learn how to prepare for a safe and healthy pregnancy (preconception care). During a preconception care visit, your health care provider will:  Do a complete physical exam, including a Pap test.  Take a complete medical history.  Give you information, answer your questions, and help you resolve problems.  Preconception checklist Medical history  Tell your health care provider about any current or past medical conditions. Your pregnancy or your ability to become pregnant may be affected by chronic conditions, such as diabetes, chronic hypertension, and thyroid problems.  Include your family's medical history as well as your partner's medical history.  Tell your health care provider about any history of STIs (sexually transmitted infections).These can affect your pregnancy. In some cases, they can be passed to your baby. Discuss any concerns that you have about STIs.  If indicated, discuss the benefits of genetic testing. This testing will show whether there are any genetic conditions that may be passed from you or your partner to your baby.  Tell your health care provider about: ? Any problems you have had with conception or pregnancy. ? Any medicines you take. These include vitamins, herbal supplements, and over-the-counter medicines. ? Your history of immunizations. Discuss any vaccinations that you may need.  Diet  Ask your health care provider what to include in a healthy diet that has a balance of nutrients. This is especially important when you are pregnant or preparing to become pregnant.  Ask your health care provider to help you reach a healthy weight before pregnancy. ? If you are overweight, you may be at higher risk for certain complications, such as high blood pressure, diabetes, and preterm birth. ? If you are underweight, you are more likely  to have a baby who has a low birth weight.  Lifestyle, work, and home  Let your health care provider know: ? About any lifestyle habits that you have, such as alcohol use, drug use, or smoking. ? About recreational activities that may put you at risk during pregnancy, such as downhill skiing and certain exercise programs. ? Tell your health care provider about any international travel, especially any travel to places with an active Zika virus outbreak. ? About harmful substances that you may be exposed to at work or at home. These include chemicals, pesticides, radiation, or even litter boxes. ? If you do not feel safe at home.  Mental health  Tell your health care provider about: ? Any history of mental health conditions, including feelings of depression, sadness, or anxiety. ? Any medicines that you take for a mental health condition. These include herbs and supplements.  Home instructions to prepare for pregnancy Lifestyle  Eat a balanced diet. This includes fresh fruits and vegetables, whole grains, lean meats, low-fat dairy products, healthy fats, and foods that are high in fiber. Ask to meet with a nutritionist or registered dietitian for assistance with meal planning and goals.  Get regular exercise. Try to be active for at least 30 minutes a day on most days of the week. Ask your health care provider which activities are safe during pregnancy.  Do not use any products that contain nicotine or tobacco, such as cigarettes and e-cigarettes. If you need help quitting, ask your health care provider.  Do not drink alcohol.  Do not take illegal drugs.  Maintain a healthy weight. Ask your health care provider what weight range is   right for you.  General instructions  Keep an accurate record of your menstrual periods. This makes it easier for your health care provider to determine your baby's due date.  Begin taking prenatal vitamins and folic acid supplements daily as directed by  your health care provider.  Manage any chronic conditions, such as high blood pressure and diabetes, as told by your health care provider. This is important.  How do I know that I am pregnant? You may be pregnant if you have been sexually active and you miss your period. Symptoms of early pregnancy include:  Mild cramping.  Very light vaginal bleeding (spotting).  Feeling unusually tired.  Nausea and vomiting (morning sickness).  If you have any of these symptoms and you suspect that you might be pregnant, you can take a home pregnancy test. These tests check for a hormone in your urine (human chorionic gonadotropin, or hCG). A woman's body begins to make this hormone during early pregnancy. These tests are very accurate. Wait until at least the first day after you miss your period to take one. If the test shows that you are pregnant (you get a positive result), call your health care provider to make an appointment for prenatal care. What should I do if I become pregnant?  Make an appointment with your health care provider as soon as you suspect you are pregnant.  Do not use any products that contain nicotine, such as cigarettes, chewing tobacco, and e-cigarettes. If you need help quitting, ask your health care provider.  Do not drink alcoholic beverages. Alcohol is related to a number of birth defects.  Avoid toxic odors and chemicals.  You may continue to have sexual intercourse if it does not cause pain or other problems, such as vaginal bleeding. This information is not intended to replace advice given to you by your health care provider. Make sure you discuss any questions you have with your health care provider. Document Released: 12/19/2007 Document Revised: 09/03/2015 Document Reviewed: 07/28/2015 Elsevier Interactive Patient Education  2018 Elsevier Inc.  

## 2017-05-18 ENCOUNTER — Other Ambulatory Visit (INDEPENDENT_AMBULATORY_CARE_PROVIDER_SITE_OTHER): Payer: BLUE CROSS/BLUE SHIELD

## 2017-05-18 ENCOUNTER — Ambulatory Visit (INDEPENDENT_AMBULATORY_CARE_PROVIDER_SITE_OTHER): Payer: BLUE CROSS/BLUE SHIELD | Admitting: Obstetrics and Gynecology

## 2017-05-18 VITALS — BP 130/90 | HR 85 | Ht 62.0 in | Wt 141.5 lb

## 2017-05-18 DIAGNOSIS — Z3A09 9 weeks gestation of pregnancy: Secondary | ICD-10-CM | POA: Diagnosis not present

## 2017-05-18 NOTE — Progress Notes (Signed)
Rose Howard presents for NOB nurse interview visit. Pregnancy confirmation done at [facility].  G-1 .  P-    . Pregnancy education material explained and given. __No_ cats in the home. NOB labs ordered.,. HIV labs and Drug screen were explained optional and she did not decline. Drug screen ordered. PNV encouraged. Genetic screening options discussed. Genetic testing:/Unsure.  Pt may discuss with provider. Pt. To follow up with provider in __2 weeks for NOB physical.  All questions answered.

## 2017-05-18 NOTE — Patient Instructions (Signed)
First Trimester of Pregnancy The first trimester of pregnancy is from week 1 until the end of week 13 (months 1 through 3). During this time, your baby will begin to develop inside you. At 6-8 weeks, the eyes and face are formed, and the heartbeat can be seen on ultrasound. At the end of 12 weeks, all the baby's organs are formed. Prenatal care is all the medical care you receive before the birth of your baby. Make sure you get good prenatal care and follow all of your doctor's instructions. Follow these instructions at home: Medicines  Take over-the-counter and prescription medicines only as told by your doctor. Some medicines are safe and some medicines are not safe during pregnancy.  Take a prenatal vitamin that contains at least 600 micrograms (mcg) of folic acid.  If you have trouble pooping (constipation), take medicine that will make your stool soft (stool softener) if your doctor approves. Eating and drinking  Eat regular, healthy meals.  Your doctor will tell you the amount of weight gain that is right for you.  Avoid raw meat and uncooked cheese.  If you feel sick to your stomach (nauseous) or throw up (vomit): ? Eat 4 or 5 small meals a day instead of 3 large meals. ? Try eating a few soda crackers. ? Drink liquids between meals instead of during meals.  To prevent constipation: ? Eat foods that are high in fiber, like fresh fruits and vegetables, whole grains, and beans. ? Drink enough fluids to keep your pee (urine) clear or pale yellow. Activity  Exercise only as told by your doctor. Stop exercising if you have cramps or pain in your lower belly (abdomen) or low back.  Do not exercise if it is too hot, too humid, or if you are in a place of great height (high altitude).  Try to avoid standing for long periods of time. Move your legs often if you must stand in one place for a long time.  Avoid heavy lifting.  Wear low-heeled shoes. Sit and stand up straight.  You  can have sex unless your doctor tells you not to. Relieving pain and discomfort  Wear a good support bra if your breasts are sore.  Take warm water baths (sitz baths) to soothe pain or discomfort caused by hemorrhoids. Use hemorrhoid cream if your doctor says it is okay.  Rest with your legs raised if you have leg cramps or low back pain.  If you have puffy, bulging veins (varicose veins) in your legs: ? Wear support hose or compression stockings as told by your doctor. ? Raise (elevate) your feet for 15 minutes, 3-4 times a day. ? Limit salt in your food. Prenatal care  Schedule your prenatal visits by the twelfth week of pregnancy.  Write down your questions. Take them to your prenatal visits.  Keep all your prenatal visits as told by your doctor. This is important. Safety  Wear your seat belt at all times when driving.  Make a list of emergency phone numbers. The list should include numbers for family, friends, the hospital, and police and fire departments. General instructions  Ask your doctor for a referral to a local prenatal class. Begin classes no later than at the start of month 6 of your pregnancy.  Ask for help if you need counseling or if you need help with nutrition. Your doctor can give you advice or tell you where to go for help.  Do not use hot tubs, steam rooms, or   saunas.  Do not douche or use tampons or scented sanitary pads.  Do not cross your legs for long periods of time.  Avoid all herbs and alcohol. Avoid drugs that are not approved by your doctor.  Do not use any tobacco products, including cigarettes, chewing tobacco, and electronic cigarettes. If you need help quitting, ask your doctor. You may get counseling or other support to help you quit.  Avoid cat litter boxes and soil used by cats. These carry germs that can cause birth defects in the baby and can cause a loss of your baby (miscarriage) or stillbirth.  Visit your dentist. At home, brush  your teeth with a soft toothbrush. Be gentle when you floss. Contact a doctor if:  You are dizzy.  You have mild cramps or pressure in your lower belly.  You have a nagging pain in your belly area.  You continue to feel sick to your stomach, you throw up, or you have watery poop (diarrhea).  You have a bad smelling fluid coming from your vagina.  You have pain when you pee (urinate).  You have increased puffiness (swelling) in your face, hands, legs, or ankles. Get help right away if:  You have a fever.  You are leaking fluid from your vagina.  You have spotting or bleeding from your vagina.  You have very bad belly cramping or pain.  You gain or lose weight rapidly.  You throw up blood. It may look like coffee grounds.  You are around people who have German measles, fifth disease, or chickenpox.  You have a very bad headache.  You have shortness of breath.  You have any kind of trauma, such as from a fall or a car accident. Summary  The first trimester of pregnancy is from week 1 until the end of week 13 (months 1 through 3).  To take care of yourself and your unborn baby, you will need to eat healthy meals, take medicines only if your doctor tells you to do so, and do activities that are safe for you and your baby.  Keep all follow-up visits as told by your doctor. This is important as your doctor will have to ensure that your baby is healthy and growing well. This information is not intended to replace advice given to you by your health care provider. Make sure you discuss any questions you have with your health care provider. Document Released: 06/24/2007 Document Revised: 01/14/2016 Document Reviewed: 01/14/2016 Elsevier Interactive Patient Education  2017 Elsevier Inc.  

## 2017-05-18 NOTE — Progress Notes (Signed)
I have reviewed the record and concur with patient management and plan.  Iyari Hagner, MD [facility]     

## 2017-05-19 LAB — MONITOR DRUG PROFILE 14(MW)
Amphetamine Scrn, Ur: NEGATIVE ng/mL
BARBITURATE SCREEN URINE: NEGATIVE ng/mL
BENZODIAZEPINE SCREEN, URINE: NEGATIVE ng/mL
BUPRENORPHINE, URINE: NEGATIVE ng/mL
CANNABINOIDS UR QL SCN: NEGATIVE ng/mL
COCAINE(METAB.)SCREEN, URINE: NEGATIVE ng/mL
Creatinine(Crt), U: 54 mg/dL (ref 20.0–300.0)
FENTANYL, URINE: NEGATIVE pg/mL
MEPERIDINE SCREEN, URINE: NEGATIVE ng/mL
Methadone Screen, Urine: NEGATIVE ng/mL
OPIATE SCREEN URINE: NEGATIVE ng/mL
OXYCODONE+OXYMORPHONE UR QL SCN: NEGATIVE ng/mL
PHENCYCLIDINE QUANTITATIVE URINE: NEGATIVE ng/mL
PROPOXYPHENE SCREEN URINE: NEGATIVE ng/mL
Ph of Urine: 6.7 (ref 4.5–8.9)
SPECIFIC GRAVITY: 1.007
Tramadol Screen, Urine: NEGATIVE ng/mL

## 2017-05-19 LAB — CBC WITH DIFFERENTIAL/PLATELET
BASOS: 0 %
Basophils Absolute: 0 10*3/uL (ref 0.0–0.2)
EOS (ABSOLUTE): 0.1 10*3/uL (ref 0.0–0.4)
Eos: 1 %
HEMOGLOBIN: 14.5 g/dL (ref 11.1–15.9)
Hematocrit: 42.1 % (ref 34.0–46.6)
IMMATURE GRANS (ABS): 0 10*3/uL (ref 0.0–0.1)
Immature Granulocytes: 0 %
LYMPHS ABS: 2.4 10*3/uL (ref 0.7–3.1)
Lymphs: 18 %
MCH: 31.3 pg (ref 26.6–33.0)
MCHC: 34.4 g/dL (ref 31.5–35.7)
MCV: 91 fL (ref 79–97)
MONOCYTES: 6 %
Monocytes Absolute: 0.8 10*3/uL (ref 0.1–0.9)
NEUTROS ABS: 9.8 10*3/uL — AB (ref 1.4–7.0)
Neutrophils: 75 %
Platelets: 381 10*3/uL — ABNORMAL HIGH (ref 150–379)
RBC: 4.64 x10E6/uL (ref 3.77–5.28)
RDW: 13.6 % (ref 12.3–15.4)
WBC: 13 10*3/uL — ABNORMAL HIGH (ref 3.4–10.8)

## 2017-05-19 LAB — URINALYSIS, ROUTINE W REFLEX MICROSCOPIC
Bilirubin, UA: NEGATIVE
Glucose, UA: NEGATIVE
KETONES UA: NEGATIVE
NITRITE UA: NEGATIVE
PH UA: 6 (ref 5.0–7.5)
Protein, UA: NEGATIVE
RBC, UA: NEGATIVE
Specific Gravity, UA: 1.011 (ref 1.005–1.030)
Urobilinogen, Ur: 0.2 mg/dL (ref 0.2–1.0)

## 2017-05-19 LAB — MICROSCOPIC EXAMINATION: Casts: NONE SEEN /lpf

## 2017-05-19 LAB — ANTIBODY SCREEN: Antibody Screen: NEGATIVE

## 2017-05-19 LAB — ABO AND RH: RH TYPE: POSITIVE

## 2017-05-19 LAB — VARICELLA ZOSTER ANTIBODY, IGG: Varicella zoster IgG: 841 index (ref >165)

## 2017-05-19 LAB — RPR: RPR Ser Ql: NONREACTIVE

## 2017-05-19 LAB — HIV ANTIBODY (ROUTINE TESTING W REFLEX): HIV Screen 4th Generation wRfx: NONREACTIVE

## 2017-05-19 LAB — HEPATITIS B SURFACE ANTIGEN: Hepatitis B Surface Ag: NEGATIVE

## 2017-05-19 LAB — RUBELLA SCREEN: Rubella Antibodies, IGG: <0.9 index — ABNORMAL LOW (ref >0.99)

## 2017-05-20 LAB — GC/CHLAMYDIA PROBE AMP
Chlamydia trachomatis, NAA: NEGATIVE
Neisseria gonorrhoeae by PCR: NEGATIVE

## 2017-05-20 LAB — URINE CULTURE: Organism ID, Bacteria: NO GROWTH

## 2017-05-27 ENCOUNTER — Encounter: Payer: Self-pay | Admitting: Obstetrics and Gynecology

## 2017-05-27 ENCOUNTER — Ambulatory Visit (INDEPENDENT_AMBULATORY_CARE_PROVIDER_SITE_OTHER): Payer: BLUE CROSS/BLUE SHIELD | Admitting: Obstetrics and Gynecology

## 2017-05-27 VITALS — BP 109/74 | HR 84 | Wt 141.4 lb

## 2017-05-27 DIAGNOSIS — R748 Abnormal levels of other serum enzymes: Secondary | ICD-10-CM

## 2017-05-27 DIAGNOSIS — Z3481 Encounter for supervision of other normal pregnancy, first trimester: Secondary | ICD-10-CM

## 2017-05-27 LAB — POCT URINALYSIS DIPSTICK
Blood, UA: NEGATIVE
Glucose, UA: NEGATIVE
Ketones, UA: NEGATIVE
LEUKOCYTES UA: NEGATIVE
NITRITE UA: NEGATIVE
ODOR: NEGATIVE
Spec Grav, UA: 1.01 (ref 1.010–1.025)
Urobilinogen, UA: 0.2 E.U./dL
pH, UA: 7.5 (ref 5.0–8.0)

## 2017-05-27 NOTE — Progress Notes (Signed)
NOB PE- pt wants panorama today.

## 2017-05-27 NOTE — Progress Notes (Signed)
NOB:  She reports she is doing well.  Has mild breast tenderness which is improving.  Occasional nausea without vomiting.  Taking prenatal vitamins. Patient describes a history of elevated liver enzymes with previous liver biopsy x2 without subsequent diagnosis.  Previously tested for hepatitis and found to be negative.  Physical examination General NAD, Conversant  HEENT Atraumatic; Op clear with mmm.  Normo-cephalic. Pupils reactive. Anicteric sclerae  Thyroid/Neck Smooth without nodularity or enlargement. Normal ROM.  Neck Supple.  Skin No rashes, lesions or ulceration. Normal palpated skin turgor. No nodularity.  Breasts: No masses or discharge.  Symmetric.  No axillary adenopathy.  Previous breast reduction surgery    Lungs: Clear to auscultation.No rales or wheezes. Normal Respiratory effort, no retractions.  Heart: NSR.  No murmurs or rubs appreciated. No periferal edema  Abdomen: Soft.  Non-tender.  No masses.  No HSM. No hernia  Extremities: Moves all appropriately.  Normal ROM for age. No lymphadenopathy.  Neuro: Oriented to PPT.  Normal mood. Normal affect.     Pelvic:   Vulva: Normal appearance.  No lesions.  Vagina: No lesions or abnormalities noted.  Support: Normal pelvic support.  Urethra No masses tenderness or scarring.  Meatus Normal size without lesions or prolapse.  Cervix: Normal appearance.  No lesions.  Anus: Normal exam.  No lesions.  Perineum: Normal exam.  No lesions.        Bimanual   Adnexae: No masses.  Non-tender to palpation.  Uterus: Enlarged. 10wks  Non-tender.  Mobile.  AV.  Adnexae: No masses.  Non-tender to palpation.  Cul-de-sac: Negative for abnormality.  Adnexae: No masses.  Non-tender to palpation.         Pelvimetry   Diagonal: Reached.  Spines: Average.  Sacrum: Concave.  Pubic Arch: Normal.    PLAN: Pap, GC/CT performed.  Baseline LFTs obtained.  Patient desires genetic testing-MaterniT 21 today.  Patient desires testing for spina  bifida-AFP next visit.

## 2017-05-27 NOTE — Addendum Note (Signed)
Addended by: Elonda Husky on: 05/27/2017 10:22 AM   Modules accepted: Level of Service

## 2017-05-28 LAB — HEPATIC FUNCTION PANEL
ALT: 37 IU/L — AB (ref 0–32)
AST: 20 IU/L (ref 0–40)
Albumin: 4.1 g/dL (ref 3.5–5.5)
Alkaline Phosphatase: 95 IU/L (ref 39–117)
BILIRUBIN TOTAL: 0.2 mg/dL (ref 0.0–1.2)
Bilirubin, Direct: 0.06 mg/dL (ref 0.00–0.40)
TOTAL PROTEIN: 6.5 g/dL (ref 6.0–8.5)

## 2017-05-31 ENCOUNTER — Other Ambulatory Visit: Payer: Self-pay

## 2017-05-31 ENCOUNTER — Encounter: Payer: Self-pay | Admitting: Obstetrics and Gynecology

## 2017-05-31 LAB — PAP IG W/ RFLX HPV ASCU: PAP Smear Comment: 0

## 2017-05-31 MED ORDER — DOXYLAMINE-PYRIDOXINE ER 20-20 MG PO TBCR: 20.0000 mg | EXTENDED_RELEASE_TABLET | Freq: Four times a day (QID) | ORAL | 1 refills | Status: DC

## 2017-06-03 ENCOUNTER — Other Ambulatory Visit: Payer: Self-pay

## 2017-06-03 ENCOUNTER — Telehealth: Payer: Self-pay | Admitting: Obstetrics and Gynecology

## 2017-06-03 MED ORDER — CITRANATAL HARMONY 27-1-260 MG PO CAPS
260.0000 mg | ORAL_CAPSULE | Freq: Every day | ORAL | 6 refills | Status: DC
Start: 2017-06-03 — End: 2017-06-22

## 2017-06-03 NOTE — Telephone Encounter (Signed)
Pt was called and informed that the pharmacy was called and they stated that the prenatal vitamins would be over 300.00 for a 90 day supply and 133.00 for one month supply. Pt was advised to try prenatal gummies since she stated that the other OTC prenatals upset her stomach.

## 2017-06-03 NOTE — Telephone Encounter (Signed)
Pt stated that she would try the gummies. Pt was advised if she had any issues with them to call the office back and we would speak with the doctor concerning her issues. Pt stated that she would try them.

## 2017-06-03 NOTE — Telephone Encounter (Signed)
Patient states RX is not at pharmacy for [facility] and requests this to be called in.

## 2017-06-07 ENCOUNTER — Telehealth: Payer: Self-pay

## 2017-06-07 NOTE — Telephone Encounter (Signed)
Pt aware panorama- wnl.   Female

## 2017-06-22 ENCOUNTER — Ambulatory Visit (INDEPENDENT_AMBULATORY_CARE_PROVIDER_SITE_OTHER): Payer: BLUE CROSS/BLUE SHIELD | Admitting: Obstetrics and Gynecology

## 2017-06-22 VITALS — BP 111/74 | HR 101 | Wt 147.5 lb

## 2017-06-22 DIAGNOSIS — Z3402 Encounter for supervision of normal first pregnancy, second trimester: Secondary | ICD-10-CM | POA: Diagnosis not present

## 2017-06-22 LAB — POCT URINALYSIS DIPSTICK
BILIRUBIN UA: NEGATIVE
GLUCOSE UA: NEGATIVE
KETONES UA: NEGATIVE
Leukocytes, UA: NEGATIVE
Nitrite, UA: NEGATIVE
Protein, UA: NEGATIVE
RBC UA: NEGATIVE
Spec Grav, UA: 1.01 (ref 1.010–1.025)
UROBILINOGEN UA: 0.2 U/dL
pH, UA: 7.5 (ref 5.0–8.0)

## 2017-06-22 NOTE — Patient Instructions (Signed)
Second Trimester of Pregnancy The second trimester is from week 13 through week 28, month 4 through 6. This is often the time in pregnancy that you feel your best. Often times, morning sickness has lessened or quit. You may have more energy, and you may get hungry more often. Your unborn baby (fetus) is growing rapidly. At the end of the sixth month, he or she is about 9 inches long and weighs about 1 pounds. You will likely feel the baby move (quickening) between 18 and 20 weeks of pregnancy. Follow these instructions at home:  Avoid all smoking, herbs, and alcohol. Avoid drugs not approved by your doctor.  Do not use any tobacco products, including cigarettes, chewing tobacco, and electronic cigarettes. If you need help quitting, ask your doctor. You may get counseling or other support to help you quit.  Only take medicine as told by your doctor. Some medicines are safe and some are not during pregnancy.  Exercise only as told by your doctor. Stop exercising if you start having cramps.  Eat regular, healthy meals.  Wear a good support bra if your breasts are tender.  Do not use hot tubs, steam rooms, or saunas.  Wear your seat belt when driving.  Avoid raw meat, uncooked cheese, and liter boxes and soil used by cats.  Take your prenatal vitamins.  Take 1500-2000 milligrams of calcium daily starting at the 20th week of pregnancy until you deliver your baby.  Try taking medicine that helps you poop (stool softener) as needed, and if your doctor approves. Eat more fiber by eating fresh fruit, vegetables, and whole grains. Drink enough fluids to keep your pee (urine) clear or pale yellow.  Take warm water baths (sitz baths) to soothe pain or discomfort caused by hemorrhoids. Use hemorrhoid cream if your doctor approves.  If you have puffy, bulging veins (varicose veins), wear support hose. Raise (elevate) your feet for 15 minutes, 3-4 times a day. Limit salt in your diet.  Avoid heavy  lifting, wear low heals, and sit up straight.  Rest with your legs raised if you have leg cramps or low back pain.  Visit your dentist if you have not gone during your pregnancy. Use a soft toothbrush to brush your teeth. Be gentle when you floss.  You can have sex (intercourse) unless your doctor tells you not to.  Go to your doctor visits. Get help if:  You feel dizzy.  You have mild cramps or pressure in your lower belly (abdomen).  You have a nagging pain in your belly area.  You continue to feel sick to your stomach (nauseous), throw up (vomit), or have watery poop (diarrhea).  You have bad smelling fluid coming from your vagina.  You have pain with peeing (urination). Get help right away if:  You have a fever.  You are leaking fluid from your vagina.  You have spotting or bleeding from your vagina.  You have severe belly cramping or pain.  You lose or gain weight rapidly.  You have trouble catching your breath and have chest pain.  You notice sudden or extreme puffiness (swelling) of your face, hands, ankles, feet, or legs.  You have not felt the baby move in over an hour.  You have severe headaches that do not go away with medicine.  You have vision changes. This information is not intended to replace advice given to you by your health care provider. Make sure you discuss any questions you have with your health care   provider. Document Released: 04/01/2009 Document Revised: 06/13/2015 Document Reviewed: 03/08/2012 Elsevier Interactive Patient Education  2017 Elsevier Inc.  

## 2017-06-22 NOTE — Progress Notes (Signed)
ROB-pt stated that she was doing well no concerns.  

## 2017-06-22 NOTE — Progress Notes (Signed)
ROB: Patient doing well, no complaints. Notes overall feeling better. Discussed patient's concerns regarding weight gain (is afraid of gaining too much).  Patient to f/u in 4 weeks, for AFP at that time.

## 2017-07-20 ENCOUNTER — Encounter: Payer: Self-pay | Admitting: Obstetrics and Gynecology

## 2017-07-20 ENCOUNTER — Ambulatory Visit (INDEPENDENT_AMBULATORY_CARE_PROVIDER_SITE_OTHER): Payer: BLUE CROSS/BLUE SHIELD | Admitting: Obstetrics and Gynecology

## 2017-07-20 VITALS — BP 107/69 | HR 97 | Wt 153.1 lb

## 2017-07-20 DIAGNOSIS — Z3402 Encounter for supervision of normal first pregnancy, second trimester: Secondary | ICD-10-CM | POA: Diagnosis not present

## 2017-07-20 LAB — POCT URINALYSIS DIPSTICK
BILIRUBIN UA: NEGATIVE
Blood, UA: NEGATIVE
Glucose, UA: NEGATIVE
KETONES UA: NEGATIVE
Leukocytes, UA: NEGATIVE
Nitrite, UA: NEGATIVE
Protein, UA: NEGATIVE
Spec Grav, UA: 1.01 (ref 1.010–1.025)
UROBILINOGEN UA: 0.2 U/dL
pH, UA: 7 (ref 5.0–8.0)

## 2017-07-20 NOTE — Progress Notes (Signed)
ROB-pt stated that she is doing well and no complaints.

## 2017-07-20 NOTE — Addendum Note (Signed)
Addended by: Silvano BilisHAMPTON, Romaine Maciolek L on: 07/20/2017 09:23 AM   Modules accepted: Orders

## 2017-07-20 NOTE — Progress Notes (Signed)
ROB: No complaints.  Desires AFP today.  FAS ultrasound in 2 weeks.

## 2017-07-21 ENCOUNTER — Other Ambulatory Visit: Payer: Self-pay | Admitting: Obstetrics and Gynecology

## 2017-07-21 DIAGNOSIS — Z3689 Encounter for other specified antenatal screening: Secondary | ICD-10-CM

## 2017-07-22 LAB — AFP, SERUM, OPEN SPINA BIFIDA
AFP MoM: 1.68
AFP Value: 69.9 ng/mL
GEST. AGE ON COLLECTION DATE: 18 wk
Maternal Age At EDD: 24 yr
OSBR RISK 1 IN: 1713
TEST RESULTS AFP: NEGATIVE
WEIGHT: 153 [lb_av]

## 2017-08-03 ENCOUNTER — Ambulatory Visit (INDEPENDENT_AMBULATORY_CARE_PROVIDER_SITE_OTHER): Payer: BLUE CROSS/BLUE SHIELD

## 2017-08-03 DIAGNOSIS — Z3689 Encounter for other specified antenatal screening: Secondary | ICD-10-CM

## 2017-08-03 DIAGNOSIS — Z363 Encounter for antenatal screening for malformations: Secondary | ICD-10-CM

## 2017-08-17 ENCOUNTER — Ambulatory Visit (INDEPENDENT_AMBULATORY_CARE_PROVIDER_SITE_OTHER): Payer: BLUE CROSS/BLUE SHIELD | Admitting: Obstetrics and Gynecology

## 2017-08-17 VITALS — BP 111/69 | HR 98 | Wt 157.6 lb

## 2017-08-17 DIAGNOSIS — Z3402 Encounter for supervision of normal first pregnancy, second trimester: Secondary | ICD-10-CM | POA: Diagnosis not present

## 2017-08-17 LAB — POCT URINALYSIS DIPSTICK
Bilirubin, UA: NEGATIVE
Glucose, UA: NEGATIVE
Ketones, UA: NEGATIVE
LEUKOCYTES UA: NEGATIVE
NITRITE UA: NEGATIVE
PH UA: 7.5 (ref 5.0–8.0)
PROTEIN UA: POSITIVE — AB
RBC UA: NEGATIVE
SPEC GRAV UA: 1.01 (ref 1.010–1.025)
Urobilinogen, UA: 0.2 E.U./dL

## 2017-08-17 NOTE — Progress Notes (Signed)
ROB: Doin well, no complaints. Normal anatomy scan and AFP.  Discussed birthing classes and volunteer doula services.  RTC in 4 weeks.

## 2017-09-14 ENCOUNTER — Ambulatory Visit (INDEPENDENT_AMBULATORY_CARE_PROVIDER_SITE_OTHER): Payer: BLUE CROSS/BLUE SHIELD | Admitting: Obstetrics and Gynecology

## 2017-09-14 VITALS — BP 109/70 | HR 103 | Wt 161.0 lb

## 2017-09-14 DIAGNOSIS — Z3402 Encounter for supervision of normal first pregnancy, second trimester: Secondary | ICD-10-CM | POA: Diagnosis not present

## 2017-09-14 LAB — POCT URINALYSIS DIPSTICK OB
Bilirubin, UA: NEGATIVE
Glucose, UA: NEGATIVE — AB
Ketones, UA: NEGATIVE
LEUKOCYTES UA: NEGATIVE
NITRITE UA: NEGATIVE
PROTEIN: NEGATIVE
RBC UA: NEGATIVE
SPEC GRAV UA: 1.01 (ref 1.010–1.025)
Urobilinogen, UA: 0.2 E.U./dL
pH, UA: 7 (ref 5.0–8.0)

## 2017-09-14 NOTE — Progress Notes (Signed)
ROB: Feels "tired".  Reports active daily fetal movement.  1 hour GCT in 2 weeks.

## 2017-09-14 NOTE — Progress Notes (Signed)
Pt presents today for routine check up. Pt states no concerns at this time.

## 2017-09-28 ENCOUNTER — Other Ambulatory Visit: Payer: BLUE CROSS/BLUE SHIELD

## 2017-09-28 DIAGNOSIS — Z3402 Encounter for supervision of normal first pregnancy, second trimester: Secondary | ICD-10-CM

## 2017-09-28 NOTE — Addendum Note (Signed)
Addended by: Haskel Khan on: 09/28/2017 08:44 AM   Modules accepted: Orders

## 2017-09-29 LAB — CBC
Hematocrit: 36.4 % (ref 34.0–46.6)
Hemoglobin: 11.9 g/dL (ref 11.1–15.9)
MCH: 28.9 pg (ref 26.6–33.0)
MCHC: 32.7 g/dL (ref 31.5–35.7)
MCV: 88 fL (ref 79–97)
Platelets: 287 10*3/uL (ref 150–450)
RBC: 4.12 x10E6/uL (ref 3.77–5.28)
RDW: 13 % (ref 12.3–15.4)
WBC: 11.9 10*3/uL — ABNORMAL HIGH (ref 3.4–10.8)

## 2017-09-29 LAB — GLUCOSE TOLERANCE, 1 HOUR: GLUCOSE, 1HR PP: 142 mg/dL (ref 65–199)

## 2017-09-29 LAB — RPR: RPR Ser Ql: NONREACTIVE

## 2017-10-12 ENCOUNTER — Encounter: Payer: Self-pay | Admitting: Obstetrics and Gynecology

## 2017-10-12 ENCOUNTER — Ambulatory Visit (INDEPENDENT_AMBULATORY_CARE_PROVIDER_SITE_OTHER): Payer: BLUE CROSS/BLUE SHIELD | Admitting: Obstetrics and Gynecology

## 2017-10-12 VITALS — BP 99/64 | HR 91 | Wt 165.8 lb

## 2017-10-12 DIAGNOSIS — R7309 Other abnormal glucose: Secondary | ICD-10-CM

## 2017-10-12 DIAGNOSIS — Z3403 Encounter for supervision of normal first pregnancy, third trimester: Secondary | ICD-10-CM

## 2017-10-12 DIAGNOSIS — Z23 Encounter for immunization: Secondary | ICD-10-CM

## 2017-10-12 LAB — POCT URINALYSIS DIPSTICK OB
Bilirubin, UA: NEGATIVE
Blood, UA: NEGATIVE
Glucose, UA: NEGATIVE
Ketones, UA: NEGATIVE
LEUKOCYTES UA: NEGATIVE
NITRITE UA: NEGATIVE
PH UA: 7.5 (ref 5.0–8.0)
POC,PROTEIN,UA: NEGATIVE
SPEC GRAV UA: 1.01 (ref 1.010–1.025)
UROBILINOGEN UA: 0.2 U/dL

## 2017-10-12 NOTE — Progress Notes (Signed)
ROB: Borderline GCT-patient would like to do 3-hour.  She is otherwise doing well.  She had some concerns regarding size of the baby but I think her growth is appropriate.

## 2017-10-12 NOTE — Progress Notes (Signed)
   ROB-PT stated that she is doing well no complaints.    

## 2017-10-26 ENCOUNTER — Ambulatory Visit (INDEPENDENT_AMBULATORY_CARE_PROVIDER_SITE_OTHER): Payer: BLUE CROSS/BLUE SHIELD | Admitting: Obstetrics and Gynecology

## 2017-10-26 ENCOUNTER — Other Ambulatory Visit: Payer: BLUE CROSS/BLUE SHIELD

## 2017-10-26 VITALS — BP 99/64 | HR 96 | Wt 167.5 lb

## 2017-10-26 DIAGNOSIS — R7309 Other abnormal glucose: Secondary | ICD-10-CM

## 2017-10-26 DIAGNOSIS — Z23 Encounter for immunization: Secondary | ICD-10-CM | POA: Diagnosis not present

## 2017-10-26 DIAGNOSIS — Z3403 Encounter for supervision of normal first pregnancy, third trimester: Secondary | ICD-10-CM

## 2017-10-26 DIAGNOSIS — O99810 Abnormal glucose complicating pregnancy: Secondary | ICD-10-CM

## 2017-10-26 LAB — POCT URINALYSIS DIPSTICK OB
Bilirubin, UA: NEGATIVE
Blood, UA: NEGATIVE
Glucose, UA: NEGATIVE
Ketones, UA: NEGATIVE
Nitrite, UA: NEGATIVE
PROTEIN: NEGATIVE
Spec Grav, UA: 1.01 (ref 1.010–1.025)
Urobilinogen, UA: 0.2 E.U./dL
pH, UA: 7.5 (ref 5.0–8.0)

## 2017-10-26 MED ORDER — TETANUS-DIPHTH-ACELL PERTUSSIS 5-2.5-18.5 LF-MCG/0.5 IM SUSP
0.5000 mL | Freq: Once | INTRAMUSCULAR | Status: AC
  Administered 2017-10-26: 0.5 mL via INTRAMUSCULAR

## 2017-10-26 NOTE — Progress Notes (Signed)
ROB: Feeling pressure and pain lower abdomen sometimes. Notes it is tolerable. 3 hr GTT done today for abnormal 1 hr. Discussed pain management in labor. Discussed contraception options, patient unsure but considering non-hormonal options. Also discussed breastfeeding and patient's concern since she has had a breast reduction.   RTC in 2 weeks.

## 2017-10-26 NOTE — Progress Notes (Signed)
ROB-Pt stated that she is having some pressure and pain in the abd area. No other complaints.

## 2017-10-26 NOTE — Patient Instructions (Signed)
[facility] [facility]  [facility]  [address], [place], [place] [address]  Phone: [phone]  [facility] (second location)  [address] [place], [place] [address]  Phone: [phone]  [facility] ([place]) [address], [place], [place] [address] Phone: [phone]  [facility]  [address] [place], [place] [address]  Phone: [phone] 

## 2017-10-27 LAB — GESTATIONAL GLUCOSE TOLERANCE
Glucose, Fasting: 81 mg/dL (ref 65–94)
Glucose, GTT - 1 Hour: 149 mg/dL (ref 65–179)
Glucose, GTT - 2 Hour: 136 mg/dL (ref 65–154)
Glucose, GTT - 3 Hour: 110 mg/dL (ref 65–139)

## 2017-11-09 ENCOUNTER — Ambulatory Visit (INDEPENDENT_AMBULATORY_CARE_PROVIDER_SITE_OTHER): Payer: BLUE CROSS/BLUE SHIELD | Admitting: Obstetrics and Gynecology

## 2017-11-09 ENCOUNTER — Encounter: Payer: Self-pay | Admitting: Obstetrics and Gynecology

## 2017-11-09 VITALS — BP 101/70 | HR 89 | Wt 169.0 lb

## 2017-11-09 DIAGNOSIS — Z3403 Encounter for supervision of normal first pregnancy, third trimester: Secondary | ICD-10-CM | POA: Diagnosis not present

## 2017-11-09 LAB — POCT URINALYSIS DIPSTICK OB
Bilirubin, UA: NEGATIVE
GLUCOSE, UA: NEGATIVE
Ketones, UA: NEGATIVE
Leukocytes, UA: NEGATIVE
Nitrite, UA: NEGATIVE
POC,PROTEIN,UA: NEGATIVE
RBC UA: NEGATIVE
SPEC GRAV UA: 1.01 (ref 1.010–1.025)
Urobilinogen, UA: 0.2 E.U./dL
pH, UA: 7 (ref 5.0–8.0)

## 2017-11-09 NOTE — Progress Notes (Signed)
ROB: Patient has occasional back pain but otherwise no complaints.  Cultures next visit.

## 2017-11-09 NOTE — Progress Notes (Signed)
Pt here today for ROB. Pt states she is feeling some back pain and some cramping.

## 2017-11-16 ENCOUNTER — Encounter: Payer: Self-pay | Admitting: Obstetrics and Gynecology

## 2017-11-16 ENCOUNTER — Ambulatory Visit (INDEPENDENT_AMBULATORY_CARE_PROVIDER_SITE_OTHER): Payer: BLUE CROSS/BLUE SHIELD | Admitting: Obstetrics and Gynecology

## 2017-11-16 VITALS — BP 122/82 | HR 103 | Wt 170.0 lb

## 2017-11-16 DIAGNOSIS — Z3403 Encounter for supervision of normal first pregnancy, third trimester: Secondary | ICD-10-CM

## 2017-11-16 LAB — POCT URINALYSIS DIPSTICK OB
BILIRUBIN UA: NEGATIVE
Glucose, UA: NEGATIVE
KETONES UA: NEGATIVE
Leukocytes, UA: NEGATIVE
Nitrite, UA: NEGATIVE
PH UA: 7 (ref 5.0–8.0)
POC,PROTEIN,UA: NEGATIVE
RBC UA: NEGATIVE
SPEC GRAV UA: 1.01 (ref 1.010–1.025)
UROBILINOGEN UA: NEGATIVE U/dL — AB

## 2017-11-16 NOTE — Progress Notes (Signed)
ROB: Patient presents today with complaint of occasional pelvic pressure.  She states she is having a few contractions per day.  Signs and symptoms of labor reviewed.  GC/CT-GBS performed today.  Cervix 2 cm 50%.

## 2017-11-16 NOTE — Progress Notes (Signed)
Pt presents today for ROB complaining of pelvic pain and pressure since 11/14/17.

## 2017-11-18 LAB — GC/CHLAMYDIA PROBE AMP
Chlamydia trachomatis, NAA: NEGATIVE
Neisseria gonorrhoeae by PCR: NEGATIVE

## 2017-11-18 LAB — STREP GP B NAA: STREP GROUP B AG: NEGATIVE

## 2017-11-23 ENCOUNTER — Ambulatory Visit (INDEPENDENT_AMBULATORY_CARE_PROVIDER_SITE_OTHER): Payer: BLUE CROSS/BLUE SHIELD | Admitting: Obstetrics and Gynecology

## 2017-11-23 VITALS — BP 127/79 | HR 120 | Wt 171.6 lb

## 2017-11-23 DIAGNOSIS — Z3403 Encounter for supervision of normal first pregnancy, third trimester: Secondary | ICD-10-CM

## 2017-11-23 LAB — POCT URINALYSIS DIPSTICK OB
Bilirubin, UA: NEGATIVE
Glucose, UA: NEGATIVE
Ketones, UA: NEGATIVE
NITRITE UA: NEGATIVE
PH UA: 6.5 (ref 5.0–8.0)
RBC UA: NEGATIVE
Spec Grav, UA: 1.015 (ref 1.010–1.025)
UROBILINOGEN UA: 0.2 U/dL

## 2017-11-23 NOTE — Progress Notes (Signed)
ROB-Pt stated that she is doing well. Normal pregnancy issues no complaints.

## 2017-11-23 NOTE — Progress Notes (Signed)
ROB: Doing well, no major issues today.  Discussed labor precautions. Cultures negative. RTC in 1 week.

## 2017-11-30 ENCOUNTER — Ambulatory Visit (INDEPENDENT_AMBULATORY_CARE_PROVIDER_SITE_OTHER): Payer: BLUE CROSS/BLUE SHIELD | Admitting: Obstetrics and Gynecology

## 2017-11-30 ENCOUNTER — Encounter: Payer: Self-pay | Admitting: Obstetrics and Gynecology

## 2017-11-30 VITALS — BP 128/83 | HR 105 | Wt 174.0 lb

## 2017-11-30 DIAGNOSIS — Z3403 Encounter for supervision of normal first pregnancy, third trimester: Secondary | ICD-10-CM | POA: Diagnosis not present

## 2017-11-30 LAB — POCT URINALYSIS DIPSTICK OB
Bilirubin, UA: NEGATIVE
GLUCOSE, UA: NEGATIVE
Ketones, UA: NEGATIVE
LEUKOCYTES UA: NEGATIVE
NITRITE UA: NEGATIVE
PH UA: 7 (ref 5.0–8.0)
PROTEIN: NEGATIVE
RBC UA: NEGATIVE
Spec Grav, UA: 1.01 (ref 1.010–1.025)
UROBILINOGEN UA: 0.2 U/dL

## 2017-11-30 NOTE — Progress Notes (Signed)
ROB: Patient complains of many contractions every day.  She says they hurt but are not too bad.  She finds them more annoying and she is "over it".  She does report decreased fetal movement since yesterday.  Signs and symptoms of labor discussed in detail.  Favorable cervix noted.  NST ordered for decreased fetal movement.  NONSTRESS TEST INTERPRETATION  INDICATIONS: {Decreased fetal movement  RESULTS:  reactive COMMENTS:   PLAN: 1. Continue fetal kick counts as directed. 2. Continue antepartum testing as scheduled.  Elonda Huskyavid J. Dayrin Stallone, M.D. 11/30/2017 4:40 PM

## 2017-11-30 NOTE — Progress Notes (Signed)
Pt here today for ROB. Pt states she is feeling well and has no concerns at this time.

## 2017-12-07 ENCOUNTER — Ambulatory Visit (INDEPENDENT_AMBULATORY_CARE_PROVIDER_SITE_OTHER): Payer: BLUE CROSS/BLUE SHIELD | Admitting: Obstetrics and Gynecology

## 2017-12-07 VITALS — BP 119/80 | HR 102 | Wt 174.8 lb

## 2017-12-07 DIAGNOSIS — Z3403 Encounter for supervision of normal first pregnancy, third trimester: Secondary | ICD-10-CM

## 2017-12-07 LAB — POCT URINALYSIS DIPSTICK OB
BILIRUBIN UA: NEGATIVE
Blood, UA: NEGATIVE
GLUCOSE, UA: NEGATIVE
KETONES UA: NEGATIVE
Nitrite, UA: NEGATIVE
Spec Grav, UA: 1.02 (ref 1.010–1.025)
Urobilinogen, UA: 0.2 E.U./dL
pH, UA: 6.5 (ref 5.0–8.0)

## 2017-12-07 NOTE — Progress Notes (Signed)
ROB: Notes pelvic pressure and pain.  Also noting back pain. Discussed labor precautions. RTC in 1 week.

## 2017-12-07 NOTE — Progress Notes (Signed)
ROB-Pt stated that she is doing well no complaints.  

## 2017-12-08 ENCOUNTER — Inpatient Hospital Stay
Admission: EM | Admit: 2017-12-08 | Discharge: 2017-12-10 | DRG: 807 | Disposition: A | Discharge status: Home / Self Care | Payer: BLUE CROSS/BLUE SHIELD | Attending: Obstetrics and Gynecology | Admitting: Obstetrics and Gynecology

## 2017-12-08 ENCOUNTER — Inpatient Hospital Stay: Payer: BLUE CROSS/BLUE SHIELD | Admitting: Anesthesiology

## 2017-12-08 ENCOUNTER — Other Ambulatory Visit: Payer: Self-pay

## 2017-12-08 DIAGNOSIS — Z3A38 38 weeks gestation of pregnancy: Secondary | ICD-10-CM

## 2017-12-08 DIAGNOSIS — O368390 Maternal care for abnormalities of the fetal heart rate or rhythm, unspecified trimester, not applicable or unspecified: Secondary | ICD-10-CM | POA: Diagnosis present

## 2017-12-08 DIAGNOSIS — R109 Unspecified abdominal pain: Secondary | ICD-10-CM | POA: Diagnosis present

## 2017-12-08 LAB — CHLAMYDIA/NGC RT PCR (ARMC ONLY)
Chlamydia Tr: NOT DETECTED
N GONORRHOEAE: NOT DETECTED

## 2017-12-08 LAB — CBC
HCT: 37.6 % (ref 36.0–46.0)
Hemoglobin: 11.8 g/dL — ABNORMAL LOW (ref 12.0–15.0)
MCH: 25.1 pg — AB (ref 26.0–34.0)
MCHC: 31.4 g/dL (ref 30.0–36.0)
MCV: 79.8 fL — AB (ref 80.0–100.0)
Platelets: 346 10*3/uL (ref 150–400)
RBC: 4.71 MIL/uL (ref 3.87–5.11)
RDW: 13.8 % (ref 11.5–15.5)
WBC: 21.3 10*3/uL — ABNORMAL HIGH (ref 4.0–10.5)
nRBC: 0 % (ref 0.0–0.2)

## 2017-12-08 LAB — ABO/RH: ABO/RH(D): O POS

## 2017-12-08 MED ORDER — OXYTOCIN 40 UNITS IN LACTATED RINGERS INFUSION - SIMPLE MED
INTRAVENOUS | Status: AC
  Administered 2017-12-08: 500 mL via INTRAVENOUS
  Filled 2017-12-08: qty 1000

## 2017-12-08 MED ORDER — ONDANSETRON HCL 4 MG/2ML IJ SOLN: 4.0000 mg | Freq: Four times a day (QID) | INTRAMUSCULAR | Status: DC | PRN

## 2017-12-08 MED ORDER — EPHEDRINE 5 MG/ML INJ
10.0000 mg | INTRAVENOUS | Status: DC | PRN
  Filled 2017-12-08: qty 2

## 2017-12-08 MED ORDER — TERBUTALINE SULFATE 1 MG/ML IJ SOLN: 0.2500 mg | Freq: Once | INTRAMUSCULAR | Status: DC | PRN

## 2017-12-08 MED ORDER — DIPHENHYDRAMINE HCL 50 MG/ML IJ SOLN: 12.5000 mg | INTRAMUSCULAR | Status: DC | PRN

## 2017-12-08 MED ORDER — OXYCODONE-ACETAMINOPHEN 5-325 MG PO TABS: 2.0000 | ORAL_TABLET | ORAL | Status: DC | PRN

## 2017-12-08 MED ORDER — LACTATED RINGERS IV SOLN: 500.0000 mL | INTRAVENOUS | Status: DC | PRN

## 2017-12-08 MED ORDER — LIDOCAINE HCL (PF) 1 % IJ SOLN
INTRAMUSCULAR | Status: AC
  Filled 2017-12-08: qty 30

## 2017-12-08 MED ORDER — FENTANYL 2.5 MCG/ML W/ROPIVACAINE 0.15% IN NS 100 ML EPIDURAL (ARMC)
12.0000 mL/h | EPIDURAL | Status: DC
  Administered 2017-12-08: 12 mL/h via EPIDURAL

## 2017-12-08 MED ORDER — MISOPROSTOL 200 MCG PO TABS
ORAL_TABLET | ORAL | Status: AC
  Filled 2017-12-08: qty 4

## 2017-12-08 MED ORDER — OXYTOCIN BOLUS FROM INFUSION
500.0000 mL | Freq: Once | INTRAVENOUS | Status: AC
  Administered 2017-12-08: 500 mL via INTRAVENOUS

## 2017-12-08 MED ORDER — LACTATED RINGERS IV SOLN
INTRAVENOUS | Status: DC
  Administered 2017-12-08 (×2): via INTRAVENOUS

## 2017-12-08 MED ORDER — PHENYLEPHRINE 40 MCG/ML (10ML) SYRINGE FOR IV PUSH (FOR BLOOD PRESSURE SUPPORT)
80.0000 ug | PREFILLED_SYRINGE | INTRAVENOUS | Status: DC | PRN
  Filled 2017-12-08: qty 5

## 2017-12-08 MED ORDER — LACTATED RINGERS IV BOLUS
1000.0000 mL | Freq: Once | INTRAVENOUS | Status: AC
  Administered 2017-12-08: 1000 mL via INTRAVENOUS

## 2017-12-08 MED ORDER — ACETAMINOPHEN 325 MG PO TABS: 650.0000 mg | ORAL_TABLET | ORAL | Status: DC | PRN

## 2017-12-08 MED ORDER — LIDOCAINE HCL (PF) 1 % IJ SOLN: 30.0000 mL | INTRAMUSCULAR | Status: DC | PRN

## 2017-12-08 MED ORDER — BUTORPHANOL TARTRATE 1 MG/ML IJ SOLN: 1.0000 mg | INTRAMUSCULAR | Status: DC | PRN

## 2017-12-08 MED ORDER — FENTANYL 2.5 MCG/ML W/ROPIVACAINE 0.15% IN NS 100 ML EPIDURAL (ARMC)
EPIDURAL | Status: AC
  Filled 2017-12-08: qty 100

## 2017-12-08 MED ORDER — LACTATED RINGERS IV SOLN: 500.0000 mL | Freq: Once | INTRAVENOUS | Status: DC

## 2017-12-08 MED ORDER — OXYCODONE-ACETAMINOPHEN 5-325 MG PO TABS: 1.0000 | ORAL_TABLET | ORAL | Status: DC | PRN

## 2017-12-08 MED ORDER — OXYTOCIN 40 UNITS IN LACTATED RINGERS INFUSION - SIMPLE MED: 1.0000 m[IU]/min | INTRAVENOUS | Status: DC

## 2017-12-08 MED ORDER — AMMONIA AROMATIC IN INHA
RESPIRATORY_TRACT | Status: AC
  Filled 2017-12-08: qty 10

## 2017-12-08 MED ORDER — LIDOCAINE HCL (PF) 1 % IJ SOLN
INTRAMUSCULAR | Status: DC | PRN
  Administered 2017-12-08: 1 mL

## 2017-12-08 MED ORDER — SOD CITRATE-CITRIC ACID 500-334 MG/5ML PO SOLN: 30.0000 mL | ORAL | Status: DC | PRN

## 2017-12-08 MED ORDER — OXYTOCIN 40 UNITS IN LACTATED RINGERS INFUSION - SIMPLE MED: 2.5000 [IU]/h | INTRAVENOUS | Status: DC

## 2017-12-08 MED ORDER — OXYTOCIN 10 UNIT/ML IJ SOLN
INTRAMUSCULAR | Status: AC
  Filled 2017-12-08: qty 2

## 2017-12-08 MED ORDER — SODIUM CHLORIDE 0.9 % IV SOLN
INTRAVENOUS | Status: DC | PRN
  Administered 2017-12-08 (×3): 5 mL via EPIDURAL

## 2017-12-08 NOTE — Anesthesia Preprocedure Evaluation (Signed)
Anesthesia Evaluation  Patient identified by MRN, date of birth, ID band Patient awake    Reviewed: Allergy & Precautions, H&P , NPO status , Patient's Chart, lab work & pertinent test results  Airway Mallampati: III  TM Distance: >3 FB     Dental  (+) Teeth Intact   Pulmonary neg pulmonary ROS,           Cardiovascular Exercise Tolerance: Good negative cardio ROS       Neuro/Psych    GI/Hepatic negative GI ROS,   Endo/Other    Renal/GU   negative genitourinary   Musculoskeletal   Abdominal   Peds  Hematology negative hematology ROS (+)   Anesthesia Other Findings Past Medical History: No date: Abnormal liver enzymes No date: Anxiety No date: Depression No date: Dysmenorrhea No date: GERD (gastroesophageal reflux disease)  Past Surgical History: 2014: BREAST SURGERY; Bilateral     Comment:  Breast Reduction 08/10/14: LIVER BIOPSY  BMI    Body Mass Index:  31.97 kg/m      Reproductive/Obstetrics (+) Pregnancy                             Anesthesia Physical Anesthesia Plan  ASA: II  Anesthesia Plan: Epidural   Post-op Pain Management:    Induction:   PONV Risk Score and Plan:   Airway Management Planned:   Additional Equipment:   Intra-op Plan:   Post-operative Plan:   Informed Consent: I have reviewed the patients History and Physical, chart, labs and discussed the procedure including the risks, benefits and alternatives for the proposed anesthesia with the patient or authorized representative who has indicated his/her understanding and acceptance.     Plan Discussed with: Anesthesiologist  Anesthesia Plan Comments:         Anesthesia Quick Evaluation

## 2017-12-08 NOTE — OB Triage Note (Signed)
Patient presented to labor and delivery with complaints of contractions that started "a couple hours ago but I've been having cramping and bad lower back pain since yesterday" States she hasn't been feeling fetal movements as much this past week, denies leaking of fluid but she has been having some vaginal bleeding since her membranes were stripped in the office yesterday.  Denies recent intercourse

## 2017-12-08 NOTE — Progress Notes (Signed)
Intrapartum Progress Note  S: Patient s/p epidural placement. Feeling much better.   O: Blood pressure 120/69, pulse (!) 102, temperature 98.2 F (36.8 C), temperature source Oral, resp. rate 18, height 5\' 2"  (1.575 m), weight 79.3 kg, last menstrual period 03/13/2017, SpO2 100 %. Gen App: NAD, comfortable Abdomen: soft, gravid FHT: baseline 140 bpm.  Accels present.  Decels present - late deceleration from baselint to 70s lasting 2 minutes beginning at 1936, followed by a variable decleleration.  Occurred immediately after AROM. moderate in degree variability.   Tocometer: contractions q 2-5 minutes, irregular Cervix: 4.5-5/90/0/AROM with clear fluid Extremities: Nontender, no edema.  Pitocin: None  Labs:  Results for orders placed or performed during the hospital encounter of 12/08/17  Chlamydia/NGC rt PCR ([facility] only)  Result Value Ref Range   Specimen source GC/Chlam ENDOCERVICAL    Chlamydia Tr NOT DETECTED NOT DETECTED   N gonorrhoeae NOT DETECTED NOT DETECTED  CBC  Result Value Ref Range   WBC 21.3 (H) 4.0 - 10.5 K/uL   RBC 4.71 3.87 - 5.11 MIL/uL   Hemoglobin 11.8 (L) 12.0 - 15.0 g/dL   HCT 40.937.6 81.136.0 - 91.446.0 %   MCV 79.8 (L) 80.0 - 100.0 fL   MCH 25.1 (L) 26.0 - 34.0 pg   MCHC 31.4 30.0 - 36.0 g/dL   RDW 78.213.8 95.611.5 - 21.315.5 %   Platelets 346 150 - 400 K/uL   nRBC 0.0 0.0 - 0.2 %  Type and screen [facility]  Result Value Ref Range   ABO/RH(D) O POS    Antibody Screen POS    Sample Expiration      12/11/2017 Performed at [facility], [address] [place], [place] [address]    Antibody Identification PENDING       Assessment:  1: SIUP at 5361w4d 2. Category II tracing  Plan:  1. Patient placed in left lateral tilt, and then right lateral tilt with resolution of decelerations.  Now early decelerations present with contractions. Tracing improving.  2. Anticipate vaginal delivery    Hildred Laserherry, Cierria Height, MD 12/08/2017 7:53 PM

## 2017-12-08 NOTE — H&P (Signed)
Obstetric History and Physical  KAMORA VOSSLER is a 22 y.o. G1P0000 with IUP at [redacted]w[redacted]d presenting for complaints of worsening cramping and low back pain since yesterday.  Patient states she has been having  irregular contractions, minimal vaginal bleeding, intact membranes, with active fetal movement.    Prenatal Course Source of Care: [facility] with onset of care at 9 weeks Pregnancy complications or risks: Patient Active Problem List   Diagnosis Date Noted  . Non-reassuring electronic fetal monitoring tracing 12/08/2017  . Elevated LFTs 11/03/2016  . Bloating 11/03/2016  . Abnormal liver enzymes 07/31/2014  . Acid reflux 07/31/2014  . 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  . Cardiac murmur 07/31/2014  . Avitaminosis D 07/31/2014  . HLD (hyperlipidemia) 07/31/2014  . Elevated carcinoembryonic antigen 07/31/2014  . Anxiety and depression 07/12/2014  . Dysmenorrhea 07/12/2014  . Contraception management 07/12/2014   She plans to breastfeed She is undecided for postpartum contraception.   Prenatal labs and studies: ABO, Rh: O/Positive/-- (04/30 1539) Antibody: Negative (04/30 1539) Rubella: <0.90 (04/30 1539) RPR: Non Reactive (09/10 0845)  HBsAg: Negative (04/30 1539)  HIV: Non Reactive (04/30 1539)  ZOX:WRUEAVWU (10/29 1632) 1 hr Glucola  normal Genetic screening normal Anatomy US normal  Past Medical History:  Diagnosis Date  . Abnormal liver enzymes   . Anxiety   . Depression   . Dysmenorrhea   . GERD (gastroesophageal reflux disease)     Past Surgical History:  Procedure Laterality Date  . BREAST SURGERY Bilateral 2014   Breast Reduction  . LIVER BIOPSY  08/10/14    OB History  Gravida Para Term Preterm AB Living  1 0 0 0 0 0  SAB TAB Ectopic Multiple Live Births  0 0 0 0      # Outcome Date GA Lbr Len/2nd Weight Sex Delivery Anes PTL Lv  1 Current             Social History   Socioeconomic History  . Marital status: Married    Spouse name:  Not on file  . Number of children: Not on file  . Years of education: Not on file  . Highest education level: Not on file  Occupational History  . Occupation: Designer, industrial/product  Social Needs  . Financial resource strain: Not on file  . Food insecurity:    Worry: Not on file    Inability: Not on file  . Transportation needs:    Medical: Not on file    Non-medical: Not on file  Tobacco Use  . Smoking status: Never Smoker  . Smokeless tobacco: Never Used  Substance and Sexual Activity  . Alcohol use: Yes    Alcohol/week: 1.0 standard drinks    Types: 1 Glasses of wine per week  . Drug use: No  . Sexual activity: Yes  Lifestyle  . Physical activity:    Days per week: Not on file    Minutes per session: Not on file  . Stress: Not on file  Relationships  . Social connections:    Talks on phone: Not on file    Gets together: Not on file    Attends religious service: Not on file    Active member of club or organization: Not on file    Attends meetings of clubs or organizations: Not on file    Relationship status: Not on file  Other Topics Concern  . Not on file  Social History Narrative  . Not on file    Family History  Problem Relation Age of Onset  . Heart disease Father   . Alcohol abuse Father   . Hypercholesterolemia Mother     Medications Prior to Admission  Medication Sig Dispense Refill Last Dose  . Prenatal MV-Min-FA-Omega-3 (PRENATAL GUMMIES/DHA & FA) 0.4-32.5 MG CHEW Chew by mouth.   Taking    No Known Allergies  Review of Systems: Negative except for what is mentioned in HPI.  Physical Exam: BP 128/85 (BP Location: Left Arm)   Pulse (!) 103   Temp 98.2 F (36.8 C) (Oral)   Resp 20   Ht 5\' 2"  (1.575 m)   Wt 79.3 kg   LMP 03/13/2017   BMI 31.97 kg/m  CONSTITUTIONAL: Well-developed, well-nourished female in no acute distress.  HENT:  Normocephalic, atraumatic, External right and left ear normal. Oropharynx is clear and moist EYES: Conjunctivae and EOM  are normal. Pupils are equal, round, and reactive to light. No scleral icterus.  NECK: Normal range of motion, supple, no masses SKIN: Skin is warm and dry. No rash noted. Not diaphoretic. No erythema. No pallor. NEUROLOGIC: Alert and oriented to person, place, and time. Normal reflexes, muscle tone coordination. No cranial nerve deficit noted. PSYCHIATRIC: Normal mood and affect. Normal behavior. Normal judgment and thought content. CARDIOVASCULAR: Normal heart rate noted, regular rhythm RESPIRATORY: Effort and breath sounds normal, no problems with respiration noted ABDOMEN: Soft, nontender, nondistended, gravid. MUSCULOSKELETAL: Normal range of motion. No edema and no tenderness. 2+ distal pulses.  Cervical Exam: Dilatation 4.5cm   Effacement 80/90%   Station -3   Presentation: cephalic FHT:  Baseline rate 140 bpm   Variability moderate  Accelerations present   Decelerations late (x 2, at 1320 and 1348, lasting ~ 3 minutes with slow return to baseline).  Contractions: Every 1-5 mins   Pertinent Labs/Studies:   No results found for this or any previous visit (from the past 24 hour(s)).  Assessment : Glorious PeachHolly H Burback is a 22 y.o. G1P0000 at 3265w4d being admitted for labor induction due to non-reassuring fetal tracing at term.  Plan: Labor: Will allow for fetal tracing to return to Category I tracing. Resuscitative measures performed (left tilt, facemask O2).  If further decelerations occur, will perform amniotomy with internal monitor placement.  Once tracing improves, will proceed with Induction/Augmentation as ordered as per protocol with Pitocin. Analgesia as needed.  Admission labs ordered.  FWB: Category II tracing currently.  GBS negative Delivery plan: Hopeful for vaginal delivery   Hildred Laserherry, Rajeev Escue, MD [facility]

## 2017-12-09 LAB — CBC
HCT: 36.9 % (ref 36.0–46.0)
Hemoglobin: 11.4 g/dL — ABNORMAL LOW (ref 12.0–15.0)
MCH: 24.8 pg — AB (ref 26.0–34.0)
MCHC: 30.9 g/dL (ref 30.0–36.0)
MCV: 80.4 fL (ref 80.0–100.0)
Platelets: 303 10*3/uL (ref 150–400)
RBC: 4.59 MIL/uL (ref 3.87–5.11)
RDW: 14 % (ref 11.5–15.5)
WBC: 22.9 10*3/uL — ABNORMAL HIGH (ref 4.0–10.5)
nRBC: 0 % (ref 0.0–0.2)

## 2017-12-09 LAB — HIV ANTIBODY (ROUTINE TESTING W REFLEX): HIV SCREEN 4TH GENERATION: NONREACTIVE

## 2017-12-09 LAB — RPR: RPR: NONREACTIVE

## 2017-12-09 MED ORDER — IBUPROFEN 800 MG PO TABS
800.0000 mg | ORAL_TABLET | Freq: Four times a day (QID) | ORAL | Status: DC
  Administered 2017-12-09: 800 mg via ORAL
  Filled 2017-12-09: qty 1

## 2017-12-09 MED ORDER — ZOLPIDEM TARTRATE 5 MG PO TABS: 5.0000 mg | ORAL_TABLET | Freq: Every evening | ORAL | Status: DC | PRN

## 2017-12-09 MED ORDER — ONDANSETRON HCL 4 MG/2ML IJ SOLN: 4.0000 mg | INTRAMUSCULAR | Status: DC | PRN

## 2017-12-09 MED ORDER — COCONUT OIL OIL: 1.0000 "application " | TOPICAL_OIL | Status: DC | PRN

## 2017-12-09 MED ORDER — DIBUCAINE 1 % RE OINT: 1.0000 "application " | TOPICAL_OINTMENT | RECTAL | Status: DC | PRN

## 2017-12-09 MED ORDER — BENZOCAINE-MENTHOL 20-0.5 % EX AERO
INHALATION_SPRAY | CUTANEOUS | Status: AC
  Administered 2017-12-09: 02:00:00
  Filled 2017-12-09: qty 56

## 2017-12-09 MED ORDER — IBUPROFEN 800 MG PO TABS
800.0000 mg | ORAL_TABLET | Freq: Four times a day (QID) | ORAL | Status: DC
  Administered 2017-12-09 – 2017-12-10 (×5): 800 mg via ORAL
  Filled 2017-12-09 (×5): qty 1

## 2017-12-09 MED ORDER — SENNOSIDES-DOCUSATE SODIUM 8.6-50 MG PO TABS
2.0000 | ORAL_TABLET | ORAL | Status: DC
  Administered 2017-12-09 – 2017-12-10 (×2): 2 via ORAL
  Filled 2017-12-09 (×2): qty 2

## 2017-12-09 MED ORDER — ONDANSETRON HCL 4 MG PO TABS: 4.0000 mg | ORAL_TABLET | ORAL | Status: DC | PRN

## 2017-12-09 MED ORDER — SIMETHICONE 80 MG PO CHEW: 80.0000 mg | CHEWABLE_TABLET | ORAL | Status: DC | PRN

## 2017-12-09 MED ORDER — SENNOSIDES-DOCUSATE SODIUM 8.6-50 MG PO TABS: 2.0000 | ORAL_TABLET | ORAL | Status: DC

## 2017-12-09 MED ORDER — WITCH HAZEL-GLYCERIN EX PADS: 1.0000 "application " | MEDICATED_PAD | CUTANEOUS | Status: DC | PRN

## 2017-12-09 MED ORDER — BENZOCAINE-MENTHOL 20-0.5 % EX AERO
1.0000 "application " | INHALATION_SPRAY | CUTANEOUS | Status: DC | PRN
  Administered 2017-12-10: 1 via TOPICAL
  Filled 2017-12-09: qty 56

## 2017-12-09 MED ORDER — DIPHENHYDRAMINE HCL 25 MG PO CAPS: 25.0000 mg | ORAL_CAPSULE | Freq: Four times a day (QID) | ORAL | Status: DC | PRN

## 2017-12-09 MED ORDER — ACETAMINOPHEN 325 MG PO TABS: 650.0000 mg | ORAL_TABLET | ORAL | Status: DC | PRN

## 2017-12-09 MED ORDER — PRENATAL MULTIVITAMIN CH
1.0000 | ORAL_TABLET | Freq: Every day | ORAL | Status: DC
  Administered 2017-12-09 – 2017-12-10 (×2): 1 via ORAL
  Filled 2017-12-09 (×2): qty 1

## 2017-12-09 NOTE — Progress Notes (Addendum)
Post Partum Day # 1, s/p VAVD  Subjective: no complaints, up ad lib, voiding and tolerating PO  Objective: Temp:  [98.1 F (36.7 C)-98.4 F (36.9 C)] 98.1 F (36.7 C) (11/21 0309) Pulse Rate:  [87-151] 90 (11/21 0309) Resp:  [18-20] 18 (11/21 0309) BP: (108-128)/(64-93) 125/75 (11/21 0309) SpO2:  [98 %-100 %] 99 % (11/21 0309) Weight:  [79.3 kg] 79.3 kg (11/20 1338)  Physical Exam:  General: alert and no distress  Lungs: clear to auscultation bilaterally Breasts: normal appearance, no masses or tenderness Heart: regular rate and rhythm, S1, S2 normal, no murmur, click, rub or gallop Abdomen: soft, non-tender; bowel sounds normal; no masses,  no organomegaly Pelvis: Lochia: appropriate, Uterine Fundus: firm Extremities: DVT Evaluation: No evidence of DVT seen on physical exam. Negative Homan's sign. No cords or calf tenderness. No significant calf/ankle edema.     Recent Labs    12/08/17 1649 12/09/17 0502  HGB 11.8* 11.4*  HCT 37.6 36.9    Assessment/Plan: Doing well postpartum Breastfeeding and bottle feeding. Notes concerns regarding h/o breast reduction and milk supply, Lactation consult as indicated Circumcision prior to discharge Contraception undecided Plan for discharge tomorrow    LOS: 1 day   Hildred Laserherry, Maegen Wigle, MD [facility] 12/09/2017 8:09 AM

## 2017-12-09 NOTE — Discharge Instructions (Signed)
Please call your doctor or return to the ER if you experience any chest pains, shortness of breath, dizziness, visual changes, fever greater than 101, any heavy bleeding (saturating more than 1 pad per hour), large clots, or foul smelling discharge, any worsening abdominal pain and cramping that is not controlled by pain medication, or any signs of postpartum depression. No tampons, enemas, douches, or sexual intercourse for 6 weeks. Also avoid tub baths, hot tubs, or swimming for 6 weeks.  °

## 2017-12-09 NOTE — Discharge Summary (Signed)
OB Discharge Summary     Patient Name: Rose PeachHolly H Lierman DOB: 07/16/1995 MRN: [id number]  Date of admission: 12/08/2017 Delivering MD: Hildred LaserHERRY, Nathalie Cavendish   Date of discharge: 12/10/2017  Admitting diagnosis: 38 wks preg contractions Intrauterine pregnancy: 1426w4d     Secondary diagnosis:  Active Problems:   Non-reassuring electronic fetal monitoring tracing  Additional problems: None     Discharge diagnosis: Term Pregnancy Delivered                                                                                                Post partum procedures:None  Augmentation: AROM  Complications: None  Hospital course:  Induction of Labor With Vaginal Delivery   22 y.o. yo G1P1001 at 3626w4d was admitted to the hospital 12/08/2017 for induction of labor.  Indication for induction: non-reassuring fetal heart tracing.  Patient had an uncomplicated labor course as follows: Membrane Rupture Time/Date: 7:35 PM ,12/08/2017   Intrapartum Procedures: Episiotomy: None [1]                                         Lacerations:  Periurethral [8];1st degree [2]  Patient had delivery of a Viable infant.  Information for the patient's newborn:  Grayce SessionsRuffolo, Boy Indy [[id number]][[id number]]  Delivery Method: Vag-Spont   12/08/2017  Details of delivery can be found in separate delivery note.  Patient had a routine postpartum course. Patient is discharged home 12/09/17.  Physical exam  Vitals:   12/09/17 1135 12/09/17 1601 12/10/17 0035 12/10/17 0740  BP: 105/71 99/61 (!) 105/56 116/78  Pulse: 86 78 69 80  Resp: 20 18 18 16   Temp: 98.2 F (36.8 C) 98.2 F (36.8 C) 98.2 F (36.8 C) 97.7 F (36.5 C)  TempSrc: Oral Oral Oral Oral  SpO2: 97% 98% 100% 99%  Weight:      Height:       General: alert and no distress Lochia: appropriate Uterine Fundus: firm Incision: N/A DVT Evaluation: No evidence of DVT seen on physical exam. Negative Homan's sign. No cords or calf tenderness. No significant calf/ankle  edema. Labs: Lab Results  Component Value Date   WBC 22.9 (H) 12/09/2017   HGB 11.4 (L) 12/09/2017   HCT 36.9 12/09/2017   MCV 80.4 12/09/2017   PLT 303 12/09/2017   CMP Latest Ref Rng & Units 05/27/2017  Glucose 65 - 99 mg/dL -  BUN 6 - 20 mg/dL -  Creatinine 5.620.44 - 1.301.00 mg/dL -  Sodium 865135 - 784145 mmol/L -  Potassium 3.5 - 5.1 mmol/L -  Chloride 101 - 111 mmol/L -  CO2 22 - 32 mmol/L -  Calcium 8.9 - 10.3 mg/dL -  Total Protein 6.0 - 8.5 g/dL 6.5  Total Bilirubin 0.0 - 1.2 mg/dL 0.2  Alkaline Phos 39 - 117 IU/L 95  AST 0 - 40 IU/L 20  ALT 0 - 32 IU/L 37(H)    Discharge instruction: per After Visit Summary and "Baby and Me Booklet".  After visit meds:  Allergies as  of 12/10/2017   No Known Allergies     Medication List    TAKE these medications   ibuprofen 800 MG tablet Commonly known as:  ADVIL,MOTRIN Take 1 tablet (800 mg total) by mouth every 8 (eight) hours as needed.   PRENATAL GUMMIES/DHA & FA 0.4-32.5 MG Chew Chew by mouth.       Diet: routine diet  Activity: Advance as tolerated. Pelvic rest for 6 weeks.   Outpatient follow up:6 weeks Follow up Appt: Future Appointments  Date Time Provider Department Center  12/14/2017  9:45 AM Linzie Collin, MD [facility] None   Follow up Visit:No follow-ups on file.  Postpartum contraception: Undecided  Newborn Data: Live born female  Birth Weight: 7 lb 0.9 oz (3200 g) APGAR: 8, 9  Newborn Delivery   Birth date/time:  12/08/2017 22:32:00 Delivery type:  Vaginal, Vacuum (Extractor)     Baby Feeding: Bottle Disposition:home with mother   12/09/2017 Hildred Laser, MD

## 2017-12-10 MED ORDER — IBUPROFEN 800 MG PO TABS: 800.0000 mg | ORAL_TABLET | Freq: Three times a day (TID) | ORAL | 1 refills | Status: DC | PRN

## 2017-12-10 NOTE — Progress Notes (Signed)
Patient discharged home with infant. Discharge instructions, prescriptions and follow up appointment given to and reviewed with patient. Patient verbalized understanding. Patient wheeled out with infant by auxiliary.  

## 2017-12-13 LAB — TYPE AND SCREEN
ABO/RH(D): O POS
Antibody Screen: POSITIVE
Unit division: 0
Unit division: 0

## 2017-12-13 LAB — BPAM RBC
Blood Product Expiration Date: 201912232359
Blood Product Expiration Date: 201912232359
Unit Type and Rh: 5100
Unit Type and Rh: 5100

## 2017-12-13 NOTE — Anesthesia Postprocedure Evaluation (Signed)
Anesthesia Post Note  Patient: Rose Howard  Procedure(s) Performed: AN AD HOC LABOR EPIDURAL  Anesthesia Type: Epidural     Last Vitals: There were no vitals filed for this visit.  Last Pain: There were no vitals filed for this visit.         Pt discharged before she was seen for post op visit. No complaints related to epidural noted by staff.      Jovita GammaKathryn L Fitzgerald

## 2017-12-14 ENCOUNTER — Encounter: Payer: BLUE CROSS/BLUE SHIELD | Admitting: Obstetrics and Gynecology

## 2018-01-20 ENCOUNTER — Ambulatory Visit (INDEPENDENT_AMBULATORY_CARE_PROVIDER_SITE_OTHER): Payer: BLUE CROSS/BLUE SHIELD | Admitting: Obstetrics and Gynecology

## 2018-01-20 ENCOUNTER — Encounter: Payer: Self-pay | Admitting: Obstetrics and Gynecology

## 2018-01-20 DIAGNOSIS — Z3202 Encounter for pregnancy test, result negative: Secondary | ICD-10-CM | POA: Diagnosis not present

## 2018-01-20 DIAGNOSIS — Z30430 Encounter for insertion of intrauterine contraceptive device: Secondary | ICD-10-CM

## 2018-01-20 LAB — POCT URINE PREGNANCY: PREG TEST UR: NEGATIVE

## 2018-01-20 NOTE — Patient Instructions (Signed)
Intrauterine Device Insertion, Care After  This sheet gives you information about how to care for yourself after your procedure. Your health care provider may also give you more specific instructions. If you have problems or questions, contact your health care provider. What can I expect after the procedure? After the procedure, it is common to have:  Cramps and pain in the abdomen.  Light bleeding (spotting) or heavier bleeding that is like your menstrual period. This may last for up to a few days.  Lower back pain.  Dizziness.  Headaches.  Nausea. Follow these instructions at home:  Before resuming sexual activity, check to make sure that you can feel the IUD string(s). You should be able to feel the end of the string(s) below the opening of your cervix. If your IUD string is in place, you may resume sexual activity. ? If you had a hormonal IUD inserted more than 7 days after your most recent period started, you will need to use a backup method of birth control for 7 days after IUD insertion. Ask your health care provider whether this applies to you.  Continue to check that the IUD is still in place by feeling for the string(s) after every menstrual period, or once a month.  Take over-the-counter and prescription medicines only as told by your health care provider.  Do not drive or use heavy machinery while taking prescription pain medicine.  Keep all follow-up visits as told by your health care provider. This is important. Contact a health care provider if:  You have bleeding that is heavier or lasts longer than a normal menstrual cycle.  You have a fever.  You have cramps or abdominal pain that get worse or do not get better with medicine.  You develop abdominal pain that is new or is not in the same area of earlier cramping and pain.  You feel lightheaded or weak.  You have abnormal or bad-smelling discharge from your vagina.  You have pain during sexual  activity.  You have any of the following problems with your IUD string(s): ? The string bothers or hurts you or your sexual partner. ? You cannot feel the string. ? The string has gotten longer.  You can feel the IUD in your vagina.  You think you may be pregnant, or you miss your menstrual period.  You think you may have an STI (sexually transmitted infection). Get help right away if:  You have flu-like symptoms.  You have a fever and chills.  You can feel that your IUD has slipped out of place. Summary  After the procedure, it is common to have cramps and pain in the abdomen. It is also common to have light bleeding (spotting) or heavier bleeding that is like your menstrual period.  Continue to check that the IUD is still in place by feeling for the string(s) after every menstrual period, or once a month.  Keep all follow-up visits as told by your health care provider. This is important.  Contact your health care provider if you have problems with your IUD string(s), such as the string getting longer or bothering you or your sexual partner. This information is not intended to replace advice given to you by your health care provider. Make sure you discuss any questions you have with your health care provider. Document Released: 09/03/2010 Document Revised: 11/27/2015 Document Reviewed: 11/27/2015 Elsevier Interactive Patient Education  2019 [employer]     Kegel Exercises Kegel exercises help strengthen the muscles that  support the rectum, vagina, small intestine, bladder, and uterus. Doing Kegel exercises can help:  Improve bladder and bowel control.  Improve sexual response.  Reduce problems and discomfort during pregnancy. Kegel exercises involve squeezing your pelvic floor muscles, which are the same muscles you squeeze when you try to stop the flow of urine. The exercises can be done while sitting, standing, or lying down, but it is best to vary your  position. Exercises 1. Squeeze your pelvic floor muscles tight. You should feel a tight lift in your rectal area. If you are a female, you should also feel a tightness in your vaginal area. Keep your stomach, buttocks, and legs relaxed. 2. Hold the muscles tight for up to 10 seconds. 3. Relax your muscles. Repeat this exercise 50 times a day or as many times as told by your health care provider. Continue to do this exercise for at least 4-6 weeks or for as long as told by your health care provider. This information is not intended to replace advice given to you by your health care provider. Make sure you discuss any questions you have with your health care provider. Document Released: 12/23/2011 Document Revised: 05/18/2016 Document Reviewed: 11/25/2014 Elsevier Interactive Patient Education  2019 [employer].

## 2018-01-20 NOTE — Progress Notes (Signed)
   OBSTETRICS POSTPARTUM CLINIC PROGRESS NOTE  Subjective:     Rose Howard is a 23 y.o. G67P1001 female who presents for a postpartum visit. She is 6 weeks postpartum following a vacuum-assisted vaginal delivery. I have fully reviewed the prenatal and intrapartum course. The delivery was at 38 gestational weeks (induced for NRFHT).  Anesthesia: epidural. Postpartum course has been well. Baby's course has been well. Baby is feeding by bottle - Enfamil Sensitive. Bleeding: patient has not resumed menses, with No LMP recorded.. Bowel function is normal. Bladder function is normal. Patient is not sexually active. Contraception method desired is IUD Surveyor, minerals). Postpartum depression screening: negative, EDPS score is 6).  The following portions of the patient's history were reviewed and updated as appropriate: allergies, current medications, past family history, past medical history, past social history, past surgical history and problem list.  Review of Systems Pertinent items noted in HPI and remainder of comprehensive ROS otherwise negative.   Objective:    BP 125/72   Pulse 69   Ht 5\' 2"  (1.575 m)   Wt 160 lb 1.6 oz (72.6 kg)   Breastfeeding No   BMI 29.28 kg/m   General:  alert and no distress   Breasts:  inspection negative, no nipple discharge or bleeding, no masses or nodularity palpable  Lungs: clear to auscultation bilaterally  Heart:  regular rate and rhythm, S1, S2 normal, no murmur, click, rub or gallop  Abdomen: soft, non-tender; bowel sounds normal; no masses,  no organomegaly.    Vulva:  normal  Vagina: normal vagina, no discharge, exudate, lesion, or erythema  Cervix:  no cervical motion tenderness and no lesions  Corpus: normal size, contour, position, consistency, mobility, non-tender  Adnexa:  normal adnexa and no mass, fullness, tenderness  Rectal Exam: Not performed.         Labs:  Lab Results  Component Value Date   HGB 11.4 (L) 12/09/2017     Assessment:     Routine postpartum exam.   Encounter for IUD insertion  Plan:    1. Contraception: IUD.  Desires insertion of Skyla IUD today, will perform.  See procedure note below.  2. Mild postpartum anemia, not less than 10, no need to reassess at this time.  3. Follow up in: 4 weeks for IUD check as needed.         GYNECOLOGY OFFICE PROCEDURE NOTE  Rose Howard is a 23 y.o. G1P1001 here for [facility] IUD insertion. No GYN concerns.  Last pap smear was on 05/2017 and was normal.  IUD Insertion Procedure Note Patient identified, informed consent performed, consent signed.   Discussed risks of irregular bleeding, cramping, infection, malpositioning or misplacement of the IUD outside the uterus which may require further procedure such as laparoscopy. Time out was performed.  Urine pregnancy test negative.  Speculum placed in the vagina.  Cervix visualized.  Cleaned with Betadine x 2.  Grasped anteriorly with a single tooth tenaculum.  Uterus sounded to 10 cm.  Skyla IUD placed per manufacturer's recommendations.  Strings trimmed to 3 cm. Tenaculum was removed, good hemostasis noted.  Patient tolerated procedure well.   Patient was given post-procedure instructions.  She was advised to have backup contraception for one week.  Patient was also asked to check IUD strings periodically and follow up in 4 weeks for IUD check.   Lot: [id number] Exp: June 2021   Hildred Laser, MD [facility]

## 2018-01-20 NOTE — Progress Notes (Signed)
   PT is present today for her postpartum visit. Pt stated that she is not breastfeeding and have not had sexually intercourse since the birth of the baby. Pt stated that she would like to get the Mirena for birth control. EPDS=6. Pt stated that she is doing well no complaints.

## 2018-02-17 ENCOUNTER — Encounter: Payer: BLUE CROSS/BLUE SHIELD | Admitting: Obstetrics and Gynecology

## 2018-02-21 ENCOUNTER — Encounter: Payer: Self-pay | Admitting: Obstetrics and Gynecology

## 2018-02-21 ENCOUNTER — Ambulatory Visit (INDEPENDENT_AMBULATORY_CARE_PROVIDER_SITE_OTHER): Payer: BLUE CROSS/BLUE SHIELD | Admitting: Obstetrics and Gynecology

## 2018-02-21 VITALS — BP 113/79 | HR 69 | Ht 62.0 in | Wt 159.7 lb

## 2018-02-21 DIAGNOSIS — Z30431 Encounter for routine checking of intrauterine contraceptive device: Secondary | ICD-10-CM

## 2018-02-21 DIAGNOSIS — Z975 Presence of (intrauterine) contraceptive device: Secondary | ICD-10-CM | POA: Insufficient documentation

## 2018-02-21 NOTE — Progress Notes (Signed)
     GYNECOLOGY OFFICE ENCOUNTER NOTE  History:  23 y.o. G1P1001 here today for today for IUD string check; Skyla  IUD was placed  01/20/2018.  No complaints about the IUD, no concerning side effects. Notes she felt her IUD threads once, but then didn't feel them anymore after this. Still complaining of feeling as if her pelvic floor is "low", however has been improving some with Kegels.   The following portions of the patient's history were reviewed and updated as appropriate: allergies, current medications, past family history, past medical history, past social history, past surgical history and problem list. Last pap smear on 05/2017 was normal, negative HRHPV.  Review of Systems:  Pertinent items are noted in HPI.   Objective:  Physical Exam Blood pressure 113/79, pulse 69, height 5\' 2"  (1.575 m), weight 159 lb 11.2 oz (72.4 kg), last menstrual period 01/29/2018, not currently breastfeeding. CONSTITUTIONAL: Well-developed, well-nourished female in no acute distress.  ABDOMEN: Soft, no distention noted.   PELVIC: Normal appearing external genitalia; normal appearing vaginal mucosa and cervix.  IUD strings visualized, about 4 cm in length outside cervix. Weak pelvic floor tone.  EXTREMTIES: extremities normal, atraumatic, no cyanosis or edema NEUROLOGIC: Grossly normal   Assessment & Plan:  - Patient to keep IUD in place for up to three years; can come in for removal if she desires pregnancy earlier or for or concerning side effects.  IUD threads cut an additional centimeter today.  - Encouraged to continue working on [employer], given instructions and to increase frequency. Can also refer to pelvic floor physical therapy as needed.    Hildred Laser, MD [facility]

## 2018-02-21 NOTE — Patient Instructions (Signed)
Kegel Exercises  Kegel exercises help strengthen the muscles that support the rectum, vagina, small intestine, bladder, and uterus. Doing Kegel exercises can help:   Improve bladder and bowel control.   Improve sexual response.   Reduce problems and discomfort during pregnancy.  Kegel exercises involve squeezing your pelvic floor muscles, which are the same muscles you squeeze when you try to stop the flow of urine. The exercises can be done while sitting, standing, or lying down, but it is best to vary your position.  Exercises  1. Squeeze your pelvic floor muscles tight. You should feel a tight lift in your rectal area. If you are a female, you should also feel a tightness in your vaginal area. Keep your stomach, buttocks, and legs relaxed.  2. Hold the muscles tight for up to 10 seconds.  3. Relax your muscles.  Repeat this exercise 50 times a day or as many times as told by your health care provider. Continue to do this exercise for at least 4-6 weeks or for as long as told by your health care provider.  This information is not intended to replace advice given to you by your health care provider. Make sure you discuss any questions you have with your health care provider.  Document Released: 12/23/2011 Document Revised: 05/18/2016 Document Reviewed: 11/25/2014  Elsevier Interactive Patient Education  2019 [employer]vier Inc[employer]

## 2018-02-21 NOTE — Progress Notes (Signed)
Pt stated that she can feel the IUD strings and think they it maybe too long. Pt also stated that everything(vaginal) feels different since the birth of the baby.

## 2018-05-31 ENCOUNTER — Encounter: Payer: BLUE CROSS/BLUE SHIELD | Admitting: Obstetrics and Gynecology

## 2018-06-29 ENCOUNTER — Encounter: Payer: Self-pay | Admitting: Family Medicine

## 2018-07-21 ENCOUNTER — Encounter: Payer: BLUE CROSS/BLUE SHIELD | Admitting: Obstetrics and Gynecology

## 2018-07-24 NOTE — Progress Notes (Signed)
Rose ScaleZach Markesha Howard D.O. [facility] [address] [place], [place] [address] Phone: [phone] Subjective:   I Rose NighKana Howard am serving as a Neurosurgeonscribe for Dr. Antoine PrimasZachary Bobby Howard.  I'm seeing this patient by the request  of:  Rose CroonNiemeyer, Meindert, MD   CC: low back pain   OZH:[id number]:Subjective  Rose PeachHolly H Howard is a 23 y.o. female coming in with complaint of low back pain. States that she picks up her child a lot and she believes that is the cause of her back pain. Sometimes she has numbness that radiates to the hips. Sometimes flexion sends radiating numbness down her back.   Onset- January  Location - lower back Duration-  Character- sharp, very achy  Aggravating factors- lifting her child, standing straight up after bending over, mornings  Reliving factors-  Therapies tried- Ice, heat, advil, tylenol  Severity-6/10     Past Medical History:  Diagnosis Date  . Abnormal liver enzymes   . Anxiety   . Depression   . Dysmenorrhea   . GERD (gastroesophageal reflux disease)    Past Surgical History:  Procedure Laterality Date  . BREAST SURGERY Bilateral 2014   Breast Reduction  . LIVER BIOPSY  08/10/14   Social History   Socioeconomic History  . Marital status: Married    Spouse name: Not on file  . Number of children: Not on file  . Years of education: Not on file  . Highest education level: Not on file  Occupational History  . Occupation: Designer, industrial/productlab tech  Social Needs  . Financial resource strain: Not on file  . Food insecurity    Worry: Not on file    Inability: Not on file  . Transportation needs    Medical: Not on file    Non-medical: Not on file  Tobacco Use  . Smoking status: Never Smoker  . Smokeless tobacco: Never Used  Substance and Sexual Activity  . Alcohol use: Yes    Alcohol/week: 1.0 standard drinks    Types: 1 Glasses of wine per week    Comment: occass  . Drug use: No  . Sexual activity: Yes    Birth control/protection: I.U.D.  Lifestyle  . Physical  activity    Days per week: Not on file    Minutes per session: Not on file  . Stress: Not on file  Relationships  . Social Musicianconnections    Talks on phone: Not on file    Gets together: Not on file    Attends religious service: Not on file    Active member of club or organization: Not on file    Attends meetings of clubs or organizations: Not on file    Relationship status: Not on file  Other Topics Concern  . Not on file  Social History Narrative  . Not on file   No Known Allergies Family History  Problem Relation Age of Onset  . Heart disease Father   . Alcohol abuse Father   . Hypercholesterolemia Mother     Current Outpatient Medications (Endocrine & Metabolic):  .  levonorgestrel (MIRENA) 20 MCG/24HR IUD, 1 each by Intrauterine route once.         Past medical history, social, surgical and family history all reviewed in electronic medical record.  No pertanent information unless stated regarding to the chief complaint.   Review of Systems:  No headache, visual changes, nausea, vomiting, diarrhea, constipation, dizziness, abdominal pain, skin rash, fevers, chills, night sweats, weight loss, swollen lymph nodes, body aches,  joint swelling, muscle aches, chest pain, shortness of breath, mood changes.   Objective  Blood pressure 94/60, pulse 74, height 5\' 2"  (1.575 m), weight 154 lb (69.9 kg), SpO2 98 %, not currently breastfeeding.    General: No apparent distress alert and oriented x3 mood and affect normal, dressed appropriately.  HEENT: Pupils equal, extraocular movements intact  Respiratory: Patient's speak in full sentences and does not appear short of breath  Cardiovascular: No lower extremity edema, non tender, no erythema  Skin: Warm dry intact with no signs of infection or rash on extremities or on axial skeleton.  Abdomen: Soft nontender  Neuro: Cranial nerves II through XII are intact, neurovascularly intact in all extremities with 2+ DTRs and 2+ pulses.   Lymph: No lymphadenopathy of posterior or anterior cervical chain or axillae bilaterally.  Gait normal with good balance and coordination.  MSK:  Non tender with full range of motion and good stability and symmetric strength and tone of shoulders, elbows, wrist, hip, knee and ankles bilaterally.  Back exam shows mild loss of lordosis.  Severe tenderness to palpation over the right sacroiliac joint.  Positive Faber test.  Negative straight leg test.  Patient does have some mild tightness in the left piriformis.  No spinous process tenderness.  5 out of 5 strength in lower extremities.  Osteopathic findings C2 flexed rotated and side bent right C6 flexed rotated and side bent left T3 extended rotated and side bent right inhaled third rib T7 extended rotated and side bent left L2 flexed rotated and side bent right Sacrum right on right   97110; 15 additional minutes spent for Therapeutic exercises as stated in above notes.  This included exercises focusing on stretching, strengthening, with significant focus on eccentric aspects.   Long term goals include an improvement in range of motion, strength, endurance as well as avoiding reinjury. Patient's frequency would include in 1-2 times a day, 3-5 times a week for a duration of 6-12 weeks. Sacroiliac Joint Mobilization and Rehab 1. Work on pretzel stretching, shoulder back and leg draped in front. 3-5 sets, 30 sec.. 2. hip abductor rotations. standing, hip flexion and rotation outward then inward. 3 sets, 15 reps. when can do comfortably, add ankle weights starting at 2 pounds.  3. cross over stretching - shoulder back to ground, same side leg crossover. 3-5 sets for 30 min..  4. rolling up and back knees to chest and rocking. 5. sacral tilt - 5 sets, hold for 5-10 seconds  Proper technique shown and discussed handout in great detail with ATC.  All questions were discussed and answered.     Impression and Recommendations:     This case required  medical decision making of moderate complexity. The above documentation has been reviewed and is accurate and complete Lyndal Pulley, DO       Note: This dictation was prepared with Dragon dictation along with smaller phrase technology. Any transcriptional errors that result from this process are unintentional.

## 2018-07-25 ENCOUNTER — Ambulatory Visit (INDEPENDENT_AMBULATORY_CARE_PROVIDER_SITE_OTHER): Payer: BC Managed Care – PPO | Admitting: Family Medicine

## 2018-07-25 ENCOUNTER — Encounter: Payer: Self-pay | Admitting: Family Medicine

## 2018-07-25 ENCOUNTER — Other Ambulatory Visit: Payer: Self-pay

## 2018-07-25 DIAGNOSIS — M999 Biomechanical lesion, unspecified: Secondary | ICD-10-CM | POA: Diagnosis not present

## 2018-07-25 DIAGNOSIS — E559 Vitamin D deficiency, unspecified: Secondary | ICD-10-CM | POA: Diagnosis not present

## 2018-07-25 DIAGNOSIS — M533 Sacrococcygeal disorders, not elsewhere classified: Secondary | ICD-10-CM | POA: Insufficient documentation

## 2018-07-25 NOTE — Assessment & Plan Note (Signed)
Encouraged replacement for muscle strength and enduranc e

## 2018-07-25 NOTE — Assessment & Plan Note (Signed)
Home exercises given.  Discussed that this is likely more multifactorial since patient was pregnant.  Responded fairly well to osteopathic manipulation.  Discussed icing regimen and home exercise.  Discussed posture and ergonomics.  Topical anti-inflammatories and over-the-counter medications.  Follow-up again in 4 to 8 weeks

## 2018-07-25 NOTE — Patient Instructions (Addendum)
Good to see you.  Ice 20 minutes 2 times daily. Usually after activity and before bed. Exercises 3 times a week.  Calcium pyruvate 1500 mg daily Turmeric 500mg  daily  Vitamin D 2000 IU daily  See me again in 4 weeks

## 2018-07-25 NOTE — Assessment & Plan Note (Signed)
Decision today to treat with OMT was based on Physical Exam  After verbal consent patient was treated with HVLA, ME, FPR techniques in  thoracic, lumbar and sacral areas  Patient tolerated the procedure well with improvement in symptoms  Patient given exercises, stretches and lifestyle modifications  See medications in patient instructions if given  Patient will follow up in 4-6 weeks 

## 2018-08-01 ENCOUNTER — Other Ambulatory Visit: Payer: Self-pay | Admitting: Physical Therapy

## 2018-08-01 MED ORDER — PREDNISONE 50 MG PO TABS: 50.0000 mg | ORAL_TABLET | Freq: Every day | ORAL | 0 refills | Status: AC

## 2018-08-09 ENCOUNTER — Encounter: Payer: Self-pay | Admitting: Family Medicine

## 2018-08-09 ENCOUNTER — Other Ambulatory Visit: Payer: Self-pay

## 2018-08-09 ENCOUNTER — Ambulatory Visit (INDEPENDENT_AMBULATORY_CARE_PROVIDER_SITE_OTHER)
Admission: RE | Admit: 2018-08-09 | Discharge: 2018-08-09 | Disposition: A | Discharge status: Home / Self Care | Payer: BC Managed Care – PPO | Source: Ambulatory Visit | Attending: Family Medicine | Admitting: Family Medicine

## 2018-08-09 ENCOUNTER — Ambulatory Visit (INDEPENDENT_AMBULATORY_CARE_PROVIDER_SITE_OTHER): Payer: BC Managed Care – PPO | Admitting: Family Medicine

## 2018-08-09 VITALS — BP 102/78 | HR 74 | Ht 62.0 in | Wt 151.0 lb

## 2018-08-09 DIAGNOSIS — M545 Low back pain, unspecified: Secondary | ICD-10-CM

## 2018-08-09 DIAGNOSIS — M533 Sacrococcygeal disorders, not elsewhere classified: Secondary | ICD-10-CM | POA: Diagnosis not present

## 2018-08-09 MED ORDER — METHYLPREDNISOLONE ACETATE 80 MG/ML IJ SUSP
80.0000 mg | Freq: Once | INTRAMUSCULAR | Status: AC
  Administered 2018-08-09: 80 mg via INTRAMUSCULAR

## 2018-08-09 MED ORDER — KETOROLAC TROMETHAMINE 60 MG/2ML IM SOLN
60.0000 mg | Freq: Once | INTRAMUSCULAR | Status: AC
  Administered 2018-08-09: 08:00:00 60 mg via INTRAMUSCULAR

## 2018-08-09 MED ORDER — VITAMIN D (ERGOCALCIFEROL) 1.25 MG (50000 UNIT) PO CAPS: 50000.0000 [IU] | ORAL_CAPSULE | ORAL | 0 refills | Status: DC

## 2018-08-09 NOTE — Assessment & Plan Note (Signed)
Still believe that most of patient's pain is secondary to the sacroiliac joint as well as the hip flexors.  Patient was having increasing discomfort with extension of the back.  X-rays and flexion-extension ordered today to evaluate for any instability.  We discussed vitamin D and increase for once weekly vitamin D that I think will be more beneficial.  Discussed posture and ergonomics.  Patient given injection of Toradol and Depo-Medrol today.  Patient is following up again in 2 weeks.  If continues to have pain future considerations will include formal physical therapy or MRI but do not feel it would change management.

## 2018-08-09 NOTE — Patient Instructions (Addendum)
Xray downstairs today Once weekly vitamin D Restart exercises in 48 hours See me again on the 6th

## 2018-08-09 NOTE — Progress Notes (Signed)
[facility] [place], [place] [address] Phone: [phone] Subjective:   Rose Howard, am serving as a scribe for Dr. Hulan Saas.   CC: Back pain follow-up  GBT:[id number]   07/25/2018: Home exercises given.  Discussed that this is likely more multifactorial since patient was pregnant.  Responded fairly well to osteopathic manipulation.  Discussed icing regimen and home exercise.  Discussed posture and ergonomics.  Topical anti-inflammatories and over-the-counter medications.  Follow-up again in 4 to 8 weeks  Update 08/09/2018: Rose Howard is a 22 y.o. female coming in with complaint of back pain. Patient states that her hip and lower back pain has increased since last visit. Does have child that she picks up and has pain with extension. Has pain with both supine and prone positions. Denies any radiating symptoms. Has been using turmeric, vitamin d and calcium pyruvate. Did use prednisone which helped pain but is slowly started increasing.  Patient rates the severity of pain is 8 out of 10   Past Medical History:  Diagnosis Date  . Abnormal liver enzymes   . Anxiety   . Depression   . Dysmenorrhea   . GERD (gastroesophageal reflux disease)    Past Surgical History:  Procedure Laterality Date  . BREAST SURGERY Bilateral 2014   Breast Reduction  . LIVER BIOPSY  08/10/14   Social History   Socioeconomic History  . Marital status: Married    Spouse name: Not on file  . Number of children: Not on file  . Years of education: Not on file  . Highest education level: Not on file  Occupational History  . Occupation: Quarry manager  Social Needs  . Financial resource strain: Not on file  . Food insecurity    Worry: Not on file    Inability: Not on file  . Transportation needs    Medical: Not on file    Non-medical: Not on file  Tobacco Use  . Smoking status: Never Smoker  . Smokeless tobacco: Never Used  Substance and Sexual  Activity  . Alcohol use: Yes    Alcohol/week: 1.0 standard drinks    Types: 1 Glasses of wine per week    Comment: occass  . Drug use: Howard  . Sexual activity: Yes    Birth control/protection: I.U.D.  Lifestyle  . Physical activity    Days per week: Not on file    Minutes per session: Not on file  . Stress: Not on file  Relationships  . Social Herbalist on phone: Not on file    Gets together: Not on file    Attends religious service: Not on file    Active member of club or organization: Not on file    Attends meetings of clubs or organizations: Not on file    Relationship status: Not on file  Other Topics Concern  . Not on file  Social History Narrative  . Not on file   Howard Known Allergies Family History  Problem Relation Age of Onset  . Heart disease Father   . Alcohol abuse Father   . Hypercholesterolemia Mother     Current Outpatient Medications (Endocrine & Metabolic):  .  levonorgestrel (MIRENA) 20 MCG/24HR IUD, 1 each by Intrauterine route once.      Current Outpatient Medications (Other):  Marland Kitchen  Vitamin D, Ergocalciferol, (DRISDOL) 1.25 MG (50000 UT) CAPS capsule, Take 1 capsule (50,000 Units total) by mouth every 7 (seven) days.  Past medical history, social, surgical and family history all reviewed in electronic medical record.  Howard pertanent information unless stated regarding to the chief complaint.   Review of Systems:  Howard headache, visual changes, nausea, vomiting, diarrhea, constipation, dizziness, abdominal pain, skin rash, fevers, chills, night sweats, weight loss, swollen lymph nodes, body aches, joint swelling, have not heard chest pain, shortness of breath, mood changes.  Positive muscle aches  Objective  Blood pressure 102/78, pulse 74, height 5\' 2"  (1.575 m), weight 151 lb (68.5 kg), SpO2 99 %, not currently breastfeeding.    General: Howard apparent distress alert and oriented x3 mood and affect normal, dressed appropriately.  HEENT:  Pupils equal, extraocular movements intact  Respiratory: Patient's speak in full sentences and does not appear short of breath  Cardiovascular: Howard lower extremity edema, non tender, Howard erythema  Skin: Warm dry intact with Howard signs of infection or rash on extremities or on axial skeleton.  Abdomen: Soft nontender  Neuro: Cranial nerves II through XII are intact, neurovascularly intact in all extremities with 2+ DTRs and 2+ pulses.  Lymph: Howard lymphadenopathy of posterior or anterior cervical chain or axillae bilaterally.  Gait normal with good balance and coordination.  MSK:  Non tender with full range of motion and good stability and symmetric strength and tone of shoulders, elbows, wrist, hip, knee and ankles bilaterally.  Back Exam:  Inspection: Unremarkable  Motion: Flexion 45 deg, Extension 25 deg worsening pain with extension, Side Bending to 35 deg bilaterally,  Rotation to 45 deg bilaterally  SLR laying: Negative  XSLR laying: Negative  Palpable tenderness: Tender to palpation in the paraspinal musculature right greater than left. FABER: negative. Sensory change: Gross sensation intact to all lumbar and sacral dermatomes.  Reflexes: 2+ at both patellar tendons, 2+ at achilles tendons, Babinski's downgoing.  Strength at foot  Plantar-flexion: 5/5 Dorsi-flexion: 5/5 Eversion: 5/5 Inversion: 5/5  Leg strength  Quad: 5/5 Hamstring: 5/5 Hip flexor: 5/5 Hip abductors: 5/5  \   Impression and Recommendations:     This case required medical decision making of moderate complexity. The above documentation has been reviewed and is accurate and complete Rose SaaZachary M Trang Bouse, DO       Note: This dictation was prepared with Dragon dictation along with smaller phrase technology. Any transcriptional errors that result from this process are unintentional.

## 2018-08-15 ENCOUNTER — Telehealth: Payer: Self-pay | Admitting: Obstetrics and Gynecology

## 2018-08-15 NOTE — Telephone Encounter (Signed)
Pt was called to speak more about her concerns for calling the office. Pt stated that she has not had any bleeding since she got her iud and now she is having pain and bleeding like a cycle. Pt was advised to take tylenl, ibupro or midol for the pain. Pt has an appt for an u/s and follow up appt with [facility] on 08/23/18.

## 2018-08-15 NOTE — Telephone Encounter (Signed)
The patient called back again and stated that she is having constant back pain. And requested to speak with nurse. Pt was advised Nurse have 24 hr window to return calls. Pt voiced understanding. Please advise.

## 2018-08-15 NOTE — Telephone Encounter (Signed)
The patient called and stated that she would like to speak with a nurse if possible in regards to her having some concerns in regards to her not having a cycle in months and she is now experiencing bleeding. Please advise.

## 2018-08-22 ENCOUNTER — Ambulatory Visit: Payer: BC Managed Care – PPO | Admitting: Family Medicine

## 2018-08-23 ENCOUNTER — Ambulatory Visit (INDEPENDENT_AMBULATORY_CARE_PROVIDER_SITE_OTHER): Payer: BC Managed Care – PPO

## 2018-08-23 ENCOUNTER — Other Ambulatory Visit: Payer: Self-pay

## 2018-08-23 ENCOUNTER — Ambulatory Visit (INDEPENDENT_AMBULATORY_CARE_PROVIDER_SITE_OTHER): Payer: BC Managed Care – PPO | Admitting: Obstetrics and Gynecology

## 2018-08-23 ENCOUNTER — Other Ambulatory Visit: Payer: Self-pay | Admitting: Obstetrics and Gynecology

## 2018-08-23 ENCOUNTER — Encounter: Payer: Self-pay | Admitting: Obstetrics and Gynecology

## 2018-08-23 VITALS — BP 105/71 | HR 75 | Ht 62.0 in | Wt 146.0 lb

## 2018-08-23 DIAGNOSIS — Z30432 Encounter for removal of intrauterine contraceptive device: Secondary | ICD-10-CM | POA: Diagnosis not present

## 2018-08-23 DIAGNOSIS — R52 Pain, unspecified: Secondary | ICD-10-CM | POA: Diagnosis not present

## 2018-08-23 DIAGNOSIS — M545 Low back pain, unspecified: Secondary | ICD-10-CM

## 2018-08-23 LAB — POCT URINALYSIS DIPSTICK
Bilirubin, UA: NEGATIVE
Glucose, UA: NEGATIVE
Ketones, UA: NEGATIVE
Leukocytes, UA: NEGATIVE
Nitrite, UA: NEGATIVE
Protein, UA: NEGATIVE
Spec Grav, UA: 1.015 (ref 1.010–1.025)
Urobilinogen, UA: 0.2 E.U./dL
pH, UA: 6 (ref 5.0–8.0)

## 2018-08-23 MED ORDER — TAYTULLA 1-20 MG-MCG(24) PO CAPS: 1.0000 | ORAL_CAPSULE | Freq: Every day | ORAL | 3 refills | Status: DC

## 2018-08-23 NOTE — Progress Notes (Signed)
Pt stated that she has back pain since February. Pt think it maybe the IUD.

## 2018-08-23 NOTE — Patient Instructions (Signed)
Ethinyl Estradiol; Norethindrone Acetate; Ferrous fumarate tablets or capsules What is this medicine? ETHINYL ESTRADIOL; NORETHINDRONE ACETATE; FERROUS FUMARATE (ETH in il es tra DYE ole; nor eth IN drone AS e tate; FER us FUE ma rate) is an oral contraceptive. The products combine two types of female hormones, an estrogen and a progestin. They are used to prevent ovulation and pregnancy. Some products are also used to treat acne in females. This medicine may be used for other purposes; ask your health care provider or pharmacist if you have questions. COMMON BRAND NAME(S): Aurovela 24 Fe 1/20, Aurovela Fe, Blisovi 24 Fe, Blisovi Fe, Estrostep Fe, Gildess 24 Fe, Gildess Fe 1.5/30, Gildess Fe 1/20, Hailey 24 Fe, Junel Fe 1.5/30, Junel Fe 1/20, Junel Fe 24, Larin Fe, Lo Loestrin Fe, Loestrin 24 Fe, Loestrin FE 1.5/30, Loestrin FE 1/20, Lomedia 24 Fe, Microgestin 24 Fe, Microgestin Fe 1.5/30, Microgestin Fe 1/20, Tarina 24 Fe, Tarina Fe 1/20, Taytulla, Tilia Fe, Tri-Legest Fe What should I tell my health care provider before I take this medicine? They need to know if you have any of these conditions:  abnormal vaginal bleeding  blood vessel disease  breast, cervical, endometrial, ovarian, liver, or uterine cancer  diabetes  gallbladder disease  heart disease or recent heart attack  high blood pressure  high cholesterol  history of blood clots  kidney disease  liver disease  migraine headaches  smoke tobacco  stroke  systemic lupus erythematosus (SLE)  an unusual or allergic reaction to estrogens, progestins, other medicines, foods, dyes, or preservatives  pregnant or trying to get pregnant  breast-feeding How should I use this medicine? Take this medicine by mouth. To reduce nausea, this medicine may be taken with food. Follow the directions on the prescription label. Take this medicine at the same time each day and in the order directed on the package. Do not take your  medicine more often than directed. A patient package insert for the product will be given with each prescription and refill. Read this sheet carefully each time. The sheet may change frequently. Contact your pediatrician regarding the use of this medicine in children. Special care may be needed. This medicine has been used in female children who have started having menstrual periods. Overdosage: If you think you have taken too much of this medicine contact a poison control center or emergency room at once. NOTE: This medicine is only for you. Do not share this medicine with others. What if I miss a dose? If you miss a dose, refer to the patient information sheet you received with your medicine for direction. If you miss more than one pill, this medicine may not be as effective and you may need to use another form of birth control. What may interact with this medicine? Do not take this medicine with the following medication:  dasabuvir; ombitasvir; paritaprevir; ritonavir  ombitasvir; paritaprevir; ritonavir This medicine may also interact with the following medications:  acetaminophen  antibiotics or medicines for infections, especially rifampin, rifabutin, rifapentine, and griseofulvin, and possibly penicillins or tetracyclines  aprepitant  ascorbic acid (vitamin C)  atorvastatin  barbiturate medicines, such as phenobarbital  bosentan  carbamazepine  caffeine  clofibrate  cyclosporine  dantrolene  doxercalciferol  felbamate  grapefruit juice  hydrocortisone  medicines for anxiety or sleeping problems, such as diazepam or temazepam  medicines for diabetes, including pioglitazone  mineral oil  modafinil  mycophenolate  nefazodone  oxcarbazepine  phenytoin  prednisolone  ritonavir or other medicines for HIV infection or   AIDS  rosuvastatin  selegiline  soy isoflavones supplements  St. John's wort  tamoxifen or  raloxifene  theophylline  thyroid hormones  topiramate  warfarin This list may not describe all possible interactions. Give your health care provider a list of all the medicines, herbs, non-prescription drugs, or dietary supplements you use. Also tell them if you smoke, drink alcohol, or use illegal drugs. Some items may interact with your medicine. What should I watch for while using this medicine? Visit your doctor or health care professional for regular checks on your progress. You will need a regular breast and pelvic exam and Pap smear while on this medicine. Use an additional method of contraception during the first cycle that you take these tablets. If you have any reason to think you are pregnant, stop taking this medicine right away and contact your doctor or health care professional. If you are taking this medicine for hormone related problems, it may take several cycles of use to see improvement in your condition. Smoking increases the risk of getting a blood clot or having a stroke while you are taking birth control pills, especially if you are more than 25 years old. You are strongly advised not to smoke. This medicine can make your body retain fluid, making your fingers, hands, or ankles swell. Your blood pressure can go up. Contact your doctor or health care professional if you feel you are retaining fluid. This medicine can make you more sensitive to the sun. Keep out of the sun. If you cannot avoid being in the sun, wear protective clothing and use sunscreen. Do not use sun lamps or tanning beds/booths. If you wear contact lenses and notice visual changes, or if the lenses begin to feel uncomfortable, consult your eye care specialist. In some women, tenderness, swelling, or minor bleeding of the gums may occur. Notify your dentist if this happens. Brushing and flossing your teeth regularly may help limit this. See your dentist regularly and inform your dentist of the medicines you  are taking. If you are going to have elective surgery, you may need to stop taking this medicine before the surgery. Consult your health care professional for advice. This medicine does not protect you against HIV infection (AIDS) or any other sexually transmitted diseases. What side effects may I notice from receiving this medicine? Side effects that you should report to your doctor or health care professional as soon as possible:  allergic reactions like skin rash, itching or hives, swelling of the face, lips, or tongue  breast tissue changes or discharge  changes in vaginal bleeding during your period or between your periods  changes in vision  chest pain  confusion  coughing up blood  dizziness  feeling faint or lightheaded  headaches or migraines  leg, arm or groin pain  loss of balance or coordination  severe or sudden headaches  stomach pain (severe)  sudden shortness of breath  sudden numbness or weakness of the face, arm or leg  symptoms of vaginal infection like itching, irritation or unusual discharge  tenderness in the upper abdomen  trouble speaking or understanding  vomiting  yellowing of the eyes or skin Side effects that usually do not require medical attention (report to your doctor or health care professional if they continue or are bothersome):  breakthrough bleeding and spotting that continues beyond the 3 initial cycles of pills  breast tenderness  mood changes, anxiety, depression, frustration, anger, or emotional outbursts  increased sensitivity to sun or ultraviolet light    nausea  skin rash, acne, or brown spots on the skin  weight gain (slight) This list may not describe all possible side effects. Call your doctor for medical advice about side effects. You may report side effects to FDA at 1-800-FDA-1088. Where should I keep my medicine? Keep out of the reach of children. Store at room temperature between 15 and 30 degrees C  (59 and 86 degrees F). Throw away any unused medicine after the expiration date. NOTE: This sheet is a summary. It may not cover all possible information. If you have questions about this medicine, talk to your doctor, pharmacist, or health care provider.  2020 Elsevier/Gold Standard (2015-09-16 08:04:41)  

## 2018-08-23 NOTE — Progress Notes (Signed)
GYNECOLOGY PROGRESS NOTE  Subjective:    Patient ID: Rose Howard, female    DOB: 11/17/1995, 23 y.o.   MRN: [id number]  HPI  Patient is a 23 y.o. 711P1001 female who presents for complaints of back pain since February.  Pain feels dull and achy, but sometimes throbbing. Also occasional gets a sharp pain in lower back that radiates down to the hips. Has been trying to manage with ice packs up until last month when she saw an Orthopedic doctor for her pain. Thought it was Was recommended to continue ice packs/heating pads, and was given 2 injections (thinks they were anti-inflammatory). Notes that they didn't help.  Also notes that she had been seen by a chiropractor (had several back adjustments that did not help) and by her PCP (who recommended ice packs/heating pads/NSAIDs, and had an X-ray that noted mild DDD).   Patient wonders if this could be related to her IUD that she had placed in February as most of her pain seems to be lower back and into the pelvis. Would like to have IUD removed.  Has had Skyla IUD in place for 6 months.   The following portions of the patient's history were reviewed and updated as appropriate: allergies, current medications, past family history, past medical history, past social history, past surgical history and problem list.  Review of Systems Pertinent items noted in HPI and remainder of comprehensive ROS otherwise negative.   Objective:   Blood pressure 105/71, pulse 75, height 5\' 2"  (1.575 m), weight 146 lb (66.2 kg), not currently breastfeeding. General appearance: alert and no distress  Abdomen: soft, non-tender; bowel sounds normal; no masses,  no organomegaly  Back: symmetric, no curvature. ROM normal. No CVA tenderness. Pelvic: external genitalia normal, rectovaginal septum normal.  Vagina without discharge.  Cervix normal appearing, no lesions and no motion tenderness. IUD threads normal length, 3 cm.  Uterus mobile, nontender, normal shape and  size.  Adnexae non-palpable, nontender bilaterally.  Extremities: extremities normal, atraumatic, no cyanosis or edema Neurologic: Grossly normal    Imaging:  Patient Name: Rose PeachHolly H Howard DOB: 11/17/1995 MRN: [id number] ULTRASOUND REPORT  Location: [facility]  Date of Service: 08/23/2018     Indications:Pelvic Pain Findings:  The uterus is anteverted and measures 8.2x 2.9 x 4.7 cm. Echo texture is homogenous without evidence of focal masses.  The Endometrium measures 3 mm. IUD is centrally located.  Right Ovary measures 3.5 x 2.0 x 2.1  cm. It is normal in appearance. Left Ovary measures 3.1 x 1.7 x 2.0 cm. It is normal in appearance.  Survey of the adnexa demonstrates no adnexal masses. There is no free fluid in the cul de sac.  Impression: 1. Anteverted uterus with IUD seen in central aspect of the endometrium.  Recommendations: 1.Clinical correlation with the patient's History and Physical Exam.   Jenine M. Marciano SequinAlessi    RDMS  Assessment:   Back pain (chronic) IUD removal  Plan:   - Ultrasound reviewed, no obvious issues with the IUD (normal location, no evidence of embedding or perforation). Discussed findings with patient. Patient still desires IUD removal to see if this will help as she has had no other significant findings with other specialists. IUD removed at patient's request. Notes that she will begin OCPs.  Given sample of Taytulla, and can call in prescription.  Patient advised on use of backup method for 2 weeks while changing contraceptive methods.  - To follow back up with PCP/Orthopedic specialist if  pain persists even after IUD removal.    GYNECOLOGY OFFICE PROCEDURE NOTE  Rose Howard is a 23 y.o. G1P1001 here for [facility] IUD removal. No GYN concerns.   IUD Removal  Patient identified, informed consent performed, consent signed.  Patient was in the dorsal lithotomy position, normal external genitalia was noted.  A speculum was  placed in the patient's vagina, normal discharge was noted, no lesions. The cervix was visualized, no lesions, no abnormal discharge.  The strings of the IUD were grasped and pulled using ring forceps. The IUD was removed in its entirety.  Patient tolerated the procedure well.    Patient will use combined OCPs for contraception  Routine preventative health maintenance measures emphasized.    Rubie Maid, MD [facility] 08/23/2018 4:49 PM

## 2018-09-15 ENCOUNTER — Ambulatory Visit: Payer: BC Managed Care – PPO | Admitting: Family Medicine

## 2018-10-28 ENCOUNTER — Other Ambulatory Visit: Payer: Self-pay | Admitting: Family Medicine

## 2018-11-14 ENCOUNTER — Other Ambulatory Visit: Payer: Self-pay

## 2018-11-14 DIAGNOSIS — Z20822 Contact with and (suspected) exposure to covid-19: Secondary | ICD-10-CM

## 2018-11-15 LAB — NOVEL CORONAVIRUS, NAA: SARS-CoV-2, NAA: NOT DETECTED

## 2018-12-30 ENCOUNTER — Other Ambulatory Visit: Payer: Self-pay

## 2018-12-30 DIAGNOSIS — Z20822 Contact with and (suspected) exposure to covid-19: Secondary | ICD-10-CM

## 2018-12-31 LAB — NOVEL CORONAVIRUS, NAA: SARS-CoV-2, NAA: NOT DETECTED

## 2019-01-02 ENCOUNTER — Telehealth: Payer: Self-pay | Admitting: *Deleted

## 2019-01-02 NOTE — Telephone Encounter (Signed)
No Covid 19 results available as of yet.

## 2019-01-05 ENCOUNTER — Emergency Department: Payer: BC Managed Care – PPO

## 2019-01-05 ENCOUNTER — Emergency Department
Admission: EM | Admit: 2019-01-05 | Discharge: 2019-01-05 | Disposition: A | Discharge status: Home / Self Care | Payer: BC Managed Care – PPO | Attending: Emergency Medicine | Admitting: Emergency Medicine

## 2019-01-05 ENCOUNTER — Other Ambulatory Visit: Payer: Self-pay

## 2019-01-05 ENCOUNTER — Encounter: Payer: Self-pay | Admitting: Emergency Medicine

## 2019-01-05 DIAGNOSIS — U071 COVID-19: Secondary | ICD-10-CM | POA: Insufficient documentation

## 2019-01-05 DIAGNOSIS — R0602 Shortness of breath: Secondary | ICD-10-CM | POA: Diagnosis present

## 2019-01-05 MED ORDER — ALBUTEROL SULFATE HFA 108 (90 BASE) MCG/ACT IN AERS: 2.0000 | INHALATION_SPRAY | Freq: Four times a day (QID) | RESPIRATORY_TRACT | 0 refills | Status: DC | PRN

## 2019-01-05 NOTE — ED Triage Notes (Signed)
Patient presents to the ED with shortness of breath, sore throat, loss of taste and smell.  Patient states she is feeling short of breath talking or walking.  Patient is in no obvious distress at this time.  Respirations are even and do not appear to be labored.  Patient was called today and told she was positive for Covid19.  Patient states she began to feel short of breath last night.  Patient's husband also has covid19.

## 2019-01-05 NOTE — ED Provider Notes (Signed)
[facility] Emergency Department Provider Note   ____________________________________________   First MD Initiated Contact with Patient 01/05/19 1243     (approximate)  I have reviewed the triage vital signs and the nursing notes.   HISTORY  Chief Complaint Shortness of Breath    HPI Rose Howard is a 23 y.o. female with no significant past medical history who presents to the ED complaining of shortness of breath.  Patient reports that she has had a sore throat, headache, malaise, and loss of taste and smell for the past 2 to 3 days.  Overnight, she seemed to have some difficulty breathing, which occurs mostly when she is walking or talking.  She denies any significant shortness of breath while at rest and she has not had any chest pain.  She also denies any fevers or cough, but does state that both her husband and son were diagnosed with COVID-19.  She also had test come back today that she is positive for COVID-19.        Past Medical History:  Diagnosis Date  . Abnormal liver enzymes   . Anxiety   . Depression   . Dysmenorrhea   . GERD (gastroesophageal reflux disease)     Patient Active Problem List   Diagnosis Date Noted  . 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 Nonallopathic lesion of sacral region 07/25/2018  . Nonallopathic lesion of thoracic region 07/25/2018  . Nonallopathic lesion of lumbosacral region 07/25/2018  . IUD (intrauterine device) in place 02/21/2018  . Elevated LFTs 11/03/2016  . Bloating 11/03/2016  . Abnormal liver enzymes 07/31/2014  . Acid reflux 07/31/2014  . 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  . Cardiac murmur 07/31/2014  . Avitaminosis D 07/31/2014  . HLD (hyperlipidemia) 07/31/2014  . Elevated carcinoembryonic antigen 07/31/2014  . Anxiety and depression 07/12/2014  . Dysmenorrhea 07/12/2014  . Contraception management 07/12/2014    Past Surgical History:  Procedure Laterality Date  . BREAST SURGERY  Bilateral 2014   Breast Reduction  . LIVER BIOPSY  08/10/14    Prior to Admission medications   Medication Sig Start Date End Date Taking? Authorizing Provider  albuterol (VENTOLIN HFA) 108 (90 Base) MCG/ACT inhaler Inhale 2 puffs into the lungs every 6 (six) hours as needed for wheezing or shortness of breath. 01/05/19   Blake Divine, MD  citalopram (CELEXA) 20 MG tablet Take 20 mg by mouth daily.    [provider]  etodolac (LODINE) 200 MG capsule Take 200 mg by mouth every 8 (eight) hours.    [provider]  levonorgestrel (MIRENA) 20 MCG/24HR IUD 1 each by Intrauterine route once.    [provider]  Norethin Ace-Eth Estrad-FE (TAYTULLA) 1-20 MG-MCG(24) CAPS Take 1 tablet by mouth daily. 08/23/18   Rubie Maid, MD  Vitamin D, Ergocalciferol, (DRISDOL) 1.25 MG (50000 UT) CAPS capsule TAKE 1 CAPSULE (50,000 UNITS TOTAL) BY MOUTH EVERY 7 (SEVEN) DAYS. 10/28/18   Lyndal Pulley, DO    Allergies Patient has no known allergies.  Family History  Problem Relation Age of Onset  . Heart disease Father   . Alcohol abuse Father   . Hypercholesterolemia Mother     Social History Social History   Tobacco Use  . Smoking status: Never Smoker  . Smokeless tobacco: Never Used  Substance Use Topics  . Alcohol use: Yes    Alcohol/week: 1.0 standard drinks    Types: 1 Glasses of wine per week    Comment: occass  . Drug  use: No    Review of Systems  Constitutional: No fever/chills.  Positive for fatigue and malaise. Eyes: No visual changes. ENT: No sore throat. Cardiovascular: Denies chest pain. Respiratory: Positive for shortness of breath. Gastrointestinal: No abdominal pain.  No nausea, no vomiting.  No diarrhea.  No constipation. Genitourinary: Negative for dysuria. Musculoskeletal: Negative for back pain. Skin: Negative for rash. Neurological: Positive for headaches, negative for focal weakness or  numbness.  ____________________________________________   PHYSICAL EXAM:  VITAL SIGNS: ED Triage Vitals  Enc Vitals Group     BP 01/05/19 1152 125/79     Pulse Rate 01/05/19 1152 (!) 102     Resp 01/05/19 1152 16     Temp 01/05/19 1152 98.5 F (36.9 C)     Temp Source 01/05/19 1152 Oral     SpO2 01/05/19 1152 100 %     Weight 01/05/19 1153 150 lb (68 kg)     Height 01/05/19 1153 5\' 2"  (1.575 m)     Head Circumference --      Peak Flow --      Pain Score 01/05/19 1152 2     Pain Loc --      Pain Edu? --      Excl. in GC? --     Constitutional: Alert and oriented. Eyes: Conjunctivae are normal. Head: Atraumatic. Nose: No congestion/rhinnorhea. Mouth/Throat: Mucous membranes are moist. Neck: Normal ROM Cardiovascular: Normal rate, regular rhythm. Grossly normal heart sounds. Respiratory: Normal respiratory effort.  No retractions. Lungs CTAB. Gastrointestinal: Soft and nontender. No distention. Genitourinary: deferred Musculoskeletal: No lower extremity tenderness nor edema. Neurologic:  Normal speech and language. No gross focal neurologic deficits are appreciated. Skin:  Skin is warm, dry and intact. No rash noted. Psychiatric: Mood and affect are normal. Speech and behavior are normal.  ____________________________________________   LABS (all labs ordered are listed, but only abnormal results are displayed)  Labs Reviewed - No data to display ____________________________________________  EKG  ED ECG REPORT I, Chesley Noonharles Shelbi Vaccaro, the attending physician, personally viewed and interpreted this ECG.   Date: 01/05/2019  EKG Time: 11:58  Rate: 80  Rhythm: normal sinus rhythm  Axis: Normal  Intervals:none  ST&T Change: None   PROCEDURES  Procedure(s) performed (including Critical Care):  Procedures   ____________________________________________   INITIAL IMPRESSION / ASSESSMENT AND PLAN / ED COURSE       23 year old female with no significant past  medical history presents to the ED with increasing shortness of breath overnight after she has recently had malaise, headaches, sore throat, and loss of smell.  She was notified earlier today that she is positive for COVID-19 and wanted to get checked out.  Her EKG shows no ischemic changes and her chest x-ray is negative for acute process, no evidence of bacterial pneumonia.  I suspect her symptoms are related to COVID-19 infection, and I doubt PE given she is well-appearing with no significant symptoms at rest.  Will prescribe albuterol for use as needed and counseled patient to acquire home pulse oximeter.  Counseled patient to follow-up with PCP and to return to the ED for new or worsening symptoms, patient agrees with plan.      ____________________________________________   FINAL CLINICAL IMPRESSION(S) / ED DIAGNOSES  Final diagnoses:  COVID-19 virus infection  Shortness of breath     ED Discharge Orders         Ordered    albuterol (VENTOLIN HFA) 108 (90 Base) MCG/ACT inhaler  Every 6 hours PRN  01/05/19 1310           Note:  This document was prepared using Dragon voice recognition software and may include unintentional dictation errors.   Chesley Noon, MD 01/05/19 1311

## 2019-01-05 NOTE — ED Triage Notes (Signed)
Pt called for triage, no response. 

## 2019-01-20 NOTE — L&D Delivery Note (Signed)
       Delivery Note   Rose Howard is a 24 y.o. G2P1001 at [redacted]w[redacted]d Estimated Date of Delivery: 09/19/19  PRE-OPERATIVE DIAGNOSIS:  1) [redacted]w[redacted]d pregnancy.  2) SROM 3 Meconium  POST-OPERATIVE DIAGNOSIS:  1) [redacted]w[redacted]d pregnancy s/p Vaginal, Spontaneous  2) same with viable infant  Delivery Type: Vaginal, Spontaneous    Delivery Anesthesia: Epidural   Labor Complications:      ESTIMATED BLOOD LOSS: 145  ml    FINDINGS:   1) female infant, Apgar scores of 8   at 1 minute and 9   at 5 minutes and a birthweight of   ounces.    2) Nuchal cord: none  SPECIMENS:   PLACENTA:   Appearance: Intact    Removal: Spontaneous      Disposition:    DISPOSITION:  Infant to left in stable condition in the delivery room, with L&D personnel and mother,  NARRATIVE SUMMARY: Labor course:  [name] is a G2P1001 at [redacted]w[redacted]d who presented for SROM with contractions.  She progressed well in labor without pitocin.  She received the appropriate anesthesia and proceeded to complete dilation. She evidenced good maternal expulsive effort during the second stage. She went on to deliver a viable infant. The placenta delivered without problems and was noted to be complete. A perineal and vaginal examination was performed. Episiotomy/Lacerations: None   Elonda Husky, M.D. 09/15/2019 9:07 PM

## 2019-01-23 ENCOUNTER — Other Ambulatory Visit: Payer: Self-pay | Admitting: Family Medicine

## 2019-01-25 ENCOUNTER — Other Ambulatory Visit: Payer: Self-pay | Admitting: Obstetrics and Gynecology

## 2019-01-25 NOTE — Telephone Encounter (Signed)
Pt called in to schud a nurse intake pt went to pcp had a conformation of preg there. Pt is exper. some nauseous. Pt is requesting some meds for it, please advise

## 2019-01-26 NOTE — Telephone Encounter (Signed)
Pt called in again she is still Pt is experiencing  some nauseous. Pt is requesting call and needs meds sent over to pharmacy. Please advise

## 2019-01-27 ENCOUNTER — Telehealth: Payer: Self-pay | Admitting: Obstetrics and Gynecology

## 2019-01-27 MED ORDER — ONDANSETRON HCL 4 MG PO TABS: 4.0000 mg | ORAL_TABLET | Freq: Three times a day (TID) | ORAL | 0 refills | Status: DC | PRN

## 2019-01-27 MED ORDER — DOXYLAMINE-PYRIDOXINE 10-10 MG PO TBEC: DELAYED_RELEASE_TABLET | ORAL | 2 refills | Status: DC

## 2019-01-27 NOTE — Telephone Encounter (Signed)
Pt called inquiring abt nausea medication. I asked [place] to reach out to [place] on patients behalf

## 2019-01-27 NOTE — Telephone Encounter (Signed)
Pt called in and said that she hasnt got a call from the nurse and the she still dosent feel good and needs that meds. Called back to the nurse and the nurse said she sent it in on te 6th and the pt was told that the pt called back and said the pharmacy didn't have the order. Called back to the nurse and the nurse said she will send it again.

## 2019-01-27 NOTE — Telephone Encounter (Signed)
Medication sent to pharmacy on 01/25/2019.

## 2019-01-31 ENCOUNTER — Telehealth: Payer: Self-pay | Admitting: Obstetrics and Gynecology

## 2019-01-31 NOTE — Telephone Encounter (Signed)
Pt called in pt stated that her med are making her more nauseous. Pt is requesting something else. Pt is requesting a call once it done. Please advise

## 2019-02-02 ENCOUNTER — Other Ambulatory Visit: Payer: Self-pay

## 2019-02-02 ENCOUNTER — Encounter: Payer: Self-pay | Admitting: Obstetrics and Gynecology

## 2019-02-02 ENCOUNTER — Ambulatory Visit (INDEPENDENT_AMBULATORY_CARE_PROVIDER_SITE_OTHER): Payer: BC Managed Care – PPO | Admitting: Obstetrics and Gynecology

## 2019-02-02 VITALS — BP 119/79 | HR 134 | Wt 144.0 lb

## 2019-02-02 DIAGNOSIS — N912 Amenorrhea, unspecified: Secondary | ICD-10-CM

## 2019-02-02 LAB — POCT URINE PREGNANCY: Preg Test, Ur: POSITIVE — AB

## 2019-02-02 NOTE — Progress Notes (Signed)
HPI:      [name] is a 24 y.o. G2P1001 who LMP was Patient's last menstrual period was 12/13/2018.  Subjective:   She presents today stating that she has missed a menstrual period and believes she is approximately [redacted] weeks pregnant.  She has started prenatal vitamins.  She has occasional pelvic cramping but mostly when she rolls over in bed.  She denies vaginal bleeding.  She has had some morning sickness but this is intermittent. Patient diagnosed with Covid on December 14.  She has lost some of her smell and taste and had this has not returned yet. She had some concerns regarding Covid during pregnancy, the chest x-ray (shielded) that she had in early pregnancy for Covid and the fact that her nausea and vomiting is intermittent.  She is very concerned about the health of her current pregnancy based on all of the above.    Hx: The following portions of the patient's history were reviewed and updated as appropriate:             Review of Systems:  Review of Systems  Constitutional: Denied constitutional symptoms, night sweats, recent illness, fatigue, fever, insomnia and weight loss.  Eyes: Denied eye symptoms, eye pain, photophobia, vision change and visual disturbance.  Ears/Nose/Throat/Neck: Denied ear, nose, throat or neck symptoms, hearing loss, nasal discharge, sinus congestion and sore throat.  Cardiovascular: Denied cardiovascular symptoms, arrhythmia, chest pain/pressure, edema, exercise intolerance, orthopnea and palpitations.  Respiratory: Denied pulmonary symptoms, asthma, pleuritic pain, productive sputum, cough, dyspnea and wheezing.  Gastrointestinal: Denied, gastro-esophageal reflux, melena, nausea and vomiting.  Genitourinary: Denied genitourinary symptoms including symptomatic vaginal discharge, pelvic relaxation issues, and urinary complaints.  Musculoskeletal: Denied musculoskeletal symptoms, stiffness, swelling, muscle weakness and myalgia.  Dermatologic: Denied  dermatology symptoms, rash and scar.  Neurologic: Denied neurology symptoms, dizziness, headache, neck pain and syncope.  Psychiatric: Denied psychiatric symptoms, anxiety and depression.  Endocrine: Denied endocrine symptoms including hot flashes and night sweats.   Meds:   Current Outpatient Medications on File Prior to Visit  Medication Sig Dispense Refill  . acetaminophen (TYLENOL) 500 MG tablet Take 500 mg by mouth as needed.    Marland Kitchen albuterol (VENTOLIN HFA) 108 (90 Base) MCG/ACT inhaler Inhale 2 puffs into the lungs every 6 (six) hours as needed for wheezing or shortness of breath. 8 g 0  . ondansetron (ZOFRAN) 4 MG tablet Take 1 tablet (4 mg total) by mouth every 8 (eight) hours as needed for nausea or vomiting. 20 tablet 0  . Prenatal MV-Min-FA-Omega-3 (PRENATAL GUMMIES/DHA & FA PO) Take 2 each by mouth daily.     No current facility-administered medications on file prior to visit.    Objective:     Vitals:   02/02/19 1635  BP: 119/79  Pulse: (!) 134              Urine pregnancy test positive  Assessment:    G2P1001 Patient Active Problem List   Diagnosis Date Noted  . 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 Nonallopathic lesion of sacral region 07/25/2018  . Nonallopathic lesion of thoracic region 07/25/2018  . Nonallopathic lesion of lumbosacral region 07/25/2018  . IUD (intrauterine device) in place 02/21/2018  . Elevated LFTs 11/03/2016  . Bloating 11/03/2016  . Abnormal liver enzymes 07/31/2014  . Acid reflux 07/31/2014  . 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  . Cardiac murmur 07/31/2014  . Avitaminosis D 07/31/2014  . HLD (hyperlipidemia) 07/31/2014  . Elevated carcinoembryonic antigen 07/31/2014  . Anxiety  and depression 07/12/2014  . Dysmenorrhea 07/12/2014  . Contraception management 07/12/2014     1. Amenorrhea     Patient early in pregnancy.  Had some concerns about x-rays during pregnancy, Covid during pregnancy, pelvic discomfort without  bleeding in early pregnancy.   Plan:            Prenatal Plan 1.  The patient was given prenatal literature. 2.  She was continued on prenatal vitamins. 3.  A prenatal lab panel was ordered or drawn. 4.  Rose ultrasound was ordered to better determine Rose EDC. 5.  A nurse visit was scheduled. 6.  Genetic testing and testing for other inheritable conditions discussed in detail. She will decide in the future whether to have these labs performed. 7.  A general overview of pregnancy testing, visit schedule, ultrasound schedule, and prenatal care was discussed. 8.  COVID and its risks associated with pregnancy, prevention by limiting exposure and use of masks, as well as the risks and benefits of vaccination during pregnancy were discussed in detail.  [facility] policy regarding office and hospital visitation and testing was explained. 9.  Benefits of breast-feeding discussed in detail including both maternal and infant benefits. Ready Set Baby website discussed.  I have reassured the patient regarding Covid in pregnancy, I have also reassured her about having a chest x-ray where her pelvis was shielded. I have ordered Rose ultrasound next week to confirm viability of her current pregnancy.  Orders Orders Placed This Encounter  Procedures  . POCT urine pregnancy    No orders of the defined types were placed in this encounter.     F/U  Return for As scheduled. I spent 23 minutes involved in the care of this patient preparing to see the patient by obtaining and reviewing her medical history (including labs, imaging tests and prior procedures), documenting clinical information in the electronic health record (EHR), writing and sending prescriptions, ordering tests or procedures and directly communicating with the patient discussing pertinent items from her history and physical exam as well as my assessment and plan as noted above.  All of her questions were answered.  Elonda Husky,  M.D. 02/02/2019 4:53 PM

## 2019-02-02 NOTE — Telephone Encounter (Signed)
Patient isn't feeling nauseous anymore. She's now worried that something may be wrong with the baby, bc she's not sick.

## 2019-02-02 NOTE — Telephone Encounter (Signed)
Please see mychart message. Pt scheduled an appointment to be seen on 02/02/19

## 2019-02-03 ENCOUNTER — Ambulatory Visit (INDEPENDENT_AMBULATORY_CARE_PROVIDER_SITE_OTHER): Payer: BC Managed Care – PPO | Admitting: Adult Health

## 2019-02-03 DIAGNOSIS — Z9889 Other specified postprocedural states: Secondary | ICD-10-CM | POA: Diagnosis not present

## 2019-02-03 DIAGNOSIS — Z3A01 Less than 8 weeks gestation of pregnancy: Secondary | ICD-10-CM | POA: Diagnosis not present

## 2019-02-03 DIAGNOSIS — R7989 Other specified abnormal findings of blood chemistry: Secondary | ICD-10-CM | POA: Diagnosis not present

## 2019-02-03 NOTE — Progress Notes (Signed)
Patient: Rose Howard, Female    DOB: 1995-04-29, 24 y.o.   MRN: [id number] Visit Date: 02/03/2019  Today's Provider: Jairo Ben, FNP   Chief Complaint  Patient presents with  . New Patient (Initial Visit)   Subjective:  Virtual Visit via Video Note  I connected with [name] on 02/03/19 at  2:00 PM EST by a video enabled telemedicine application and verified that I am speaking with the correct person using two identifiers.  Location: Patient:  At home  Provider: Provider: Provider's office at  [facility], [place] .      I discussed the limitations of evaluation and management by telemedicine and the availability of in person appointments. The patient expressed understanding and agreed to proceed.     I discussed the assessment and treatment plan with the patient. The patient was provided an opportunity to ask questions and all were answered. The patient agreed with the plan and demonstrated an understanding of the instructions.   The patient was advised to call back or seek an in-person evaluation if the symptoms worsen or if the condition fails to improve as anticipated.  I provided 15 minutes of non-face-to-face time during this encounter.    New Patient Rose Howard is a 24 y.o. female who presents today for health maintenance and establish care as a new patient. She feels well. She reports exercising some. She reports she is sleeping well.    History of 2 liver biopsies. She has had last one  2018. 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  Needs follow up not seeing specialist. She is reading that from reports.   She is [redacted] weeks pregnant, She went yesterday to 02/02/2019 saw Dr. Valentino Saxon at [facility]. She was having nausea that it had stopped and she was concerned so whe went in and she said all was ok and she has ultrasound next week. She reports first pregnancy was normal, vaginal delivery at 39 weeks. G2 P1 .  She is taking prenatals and recommended folic acid. She denies any vaginal or rectal concerns. Denies exposure to any STD's.   She had covid on 01/05/2019 and feels fine, she had no cough, she had fatigue, muscle aches and sore throat. All symptoms resolved except for loss taste - was mild she  still has no smell.   LMP November 24,2021  Denies any rectal symptoms. No GI bleeding. Denies any pain in abdominal pain.. She has had mild back from picking up her toddler she reports this is improving.  She was seen at [facility], she reports she had labs done 2 weeks ago with a physical. She reports her liver enzymes were elevated. She reports they have been elevated for multiple years and she has been worked up prior. She does not want another liver biopsy she reports.   She has no other concerns today. She wants to establish care. She denies any need for refills.   Patient  denies any fever, body aches,chills, rash, chest pain, shortness of breath, nausea, vomiting, or diarrhea.   -----------------------------------------------------------------   Review of Systems  Constitutional: Positive for fatigue. Negative for activity change, appetite change, chills, diaphoresis, fever and unexpected weight change.  HENT: Negative.   Eyes: Negative.   Respiratory: Negative.   Cardiovascular: Negative.   Gastrointestinal: Negative.   Endocrine: Negative.   Genitourinary: Negative for decreased urine volume, difficulty urinating, dyspareunia, dysuria, enuresis, flank pain, frequency, genital sores, hematuria, menstrual problem,  pelvic pain, urgency, vaginal bleeding and vaginal discharge.       Pregnancy   Musculoskeletal: Negative.   Skin: Negative.     Social History She  reports that she has never smoked. She has never used smokeless tobacco. She reports current alcohol use of about 1.0 standard drinks of alcohol per week. She reports that she does not use drugs. Social History    Socioeconomic History  . Marital status: Married    Spouse name: Not on file  . Number of children: Not on file  . Years of education: Not on file  . Highest education level: Not on file  Occupational History  . Occupation: Designer, industrial/product  Tobacco Use  . Smoking status: Never Smoker  . Smokeless tobacco: Never Used  Substance and Sexual Activity  . Alcohol use: Yes    Alcohol/week: 1.0 standard drinks    Types: 1 Glasses of wine per week    Comment: occass  . Drug use: No  . Sexual activity: Yes    Birth control/protection: I.U.D.  Other Topics Concern  . Not on file  Social History Narrative  . Not on file   Social Determinants of Health   Financial Resource Strain:   . Difficulty of Paying Living Expenses: Not on file  Food Insecurity:   . Worried About Programme researcher, broadcasting/film/video in the Last Year: Not on file  . Ran Out of Food in the Last Year: Not on file  Transportation Needs:   . Lack of Transportation (Medical): Not on file  . Lack of Transportation (Non-Medical): Not on file  Physical Activity:   . Days of Exercise per Week: Not on file  . Minutes of Exercise per Session: Not on file  Stress:   . Feeling of Stress : Not on file  Social Connections:   . Frequency of Communication with Friends and Family: Not on file  . Frequency of Social Gatherings with Friends and Family: Not on file  . Attends Religious Services: Not on file  . Active Member of Clubs or Organizations: Not on file  . Attends Banker Meetings: Not on file  . Marital Status: Not on file    Patient Active Problem List   Diagnosis Date Noted  . 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 Nonallopathic lesion of sacral region 07/25/2018  . Nonallopathic lesion of thoracic region 07/25/2018  . Nonallopathic lesion of lumbosacral region 07/25/2018  . IUD (intrauterine device) in place 02/21/2018  . Elevated LFTs 11/03/2016  . Bloating 11/03/2016  . Abnormal liver enzymes 07/31/2014   . Acid reflux 07/31/2014  . 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  . Cardiac murmur 07/31/2014  . Avitaminosis D 07/31/2014  . HLD (hyperlipidemia) 07/31/2014  . Elevated carcinoembryonic antigen 07/31/2014  . Anxiety and depression 07/12/2014  . Dysmenorrhea 07/12/2014  . Contraception management 07/12/2014    Past Surgical History:  Procedure Laterality Date  . BREAST SURGERY Bilateral 2014   Breast Reduction  . LIVER BIOPSY  08/10/14    Family History  Family Status  Relation Name Status  . Father  Deceased  . Mother  Alive   Her family history includes Alcohol abuse in her father; Heart disease in her father; Hypercholesterolemia in her mother.     No Known Allergies  Previous Medications   ACETAMINOPHEN (TYLENOL) 500 MG TABLET    Take 500 mg by mouth as needed.   ALBUTEROL (VENTOLIN HFA) 108 (90 BASE) MCG/ACT INHALER    Inhale 2 puffs into  the lungs every 6 (six) hours as needed for wheezing or shortness of breath.   ONDANSETRON (ZOFRAN) 4 MG TABLET    Take 1 tablet (4 mg total) by mouth every 8 (eight) hours as needed for nausea or vomiting.   PRENATAL MV-MIN-FA-OMEGA-3 (PRENATAL GUMMIES/DHA & FA PO)    Take 2 each by mouth daily.    Patient Care Team: Flinchum, Kelby Aline, FNP as PCP - General (Family Medicine)      Objective:   Vitals: LMP 12/13/2018    Physical Exam    Patient is alert and oriented and responsive to questions Engages in conversation with provider. Speaks in full sentences without any pauses without any shortness of breath or distress.     Assessment & Plan:     Routine Health Maintenance and Physical Exam  Exercise Activities and Dietary recommendations Goals   None     Immunization History  Administered Date(s) Administered  . Tdap 10/26/2017    Health Maintenance  Topic Date Due  . INFLUENZA VACCINE  08/20/2018  . PAP-Cervical Cytology Screening  05/27/2020  . PAP SMEAR-Modifier  05/27/2020  . TETANUS/TDAP  10/27/2027  . HIV  Screening  Completed   1. Less than [redacted] weeks gestation of pregnancy She will keep follow up's with OB GYN.  Report any new or changing concerns.   2. Elevated LFTs Asked patient to have this sent to the office- as well as her labs and medical history.   3. History of liver biopsy Records being sent to this office. She is advised she should keep follow up with the specialist she was seeing.   No vital signs were available for this visit.   Within one month she will come to the office for lab review and she will bring labs with her or have sent. She will see OBGYN in a few weeks for labs she reports. She denies any other concerns for today's visit. This was a virtual visit.  Advised patient call the office or your primary care doctor for an appointment if no improvement within 72 hours or if any symptoms change or worsen at any time  Advised ER or urgent Care if after hours or on weekend. Call 911 for emergency symptoms at any time.Patinet verbalized understanding of all instructions given/reviewed and treatment plan and has no further questions or concerns at this time.     The entirety of the information documented in the History of Present Illness, Review of Systems and Physical Exam were personally obtained by me. Portions of this information were initially documented by the  Certified Medical Assistant whose name is documented in [facility] and reviewed by me for thoroughness and accuracy.  I have personally performed the exam and reviewed the chart and it is accurate to the best of my knowledge.  Haematologist has been used and any errors in dictation or transcription are unintentional.  Kelby Aline. [facility]    Discussed health benefits of physical activity, and encouraged her to engage in regular exercise appropriate for her age and condition.    --------------------------------------------------------------------

## 2019-02-05 ENCOUNTER — Encounter: Payer: Self-pay | Admitting: Adult Health

## 2019-02-05 DIAGNOSIS — Z3A01 Less than 8 weeks gestation of pregnancy: Secondary | ICD-10-CM | POA: Insufficient documentation

## 2019-02-05 DIAGNOSIS — Z9889 Other specified postprocedural states: Secondary | ICD-10-CM | POA: Insufficient documentation

## 2019-02-05 NOTE — Patient Instructions (Signed)
Eating Plan for Pregnant Women While you are pregnant, your body requires additional nutrition to help support your growing baby. You also have a higher need for some vitamins and minerals, such as folic acid, calcium, iron, and vitamin D. Eating a healthy, well-balanced diet is very important for your health and your baby's health. Your need for extra calories varies for the three 80-monthsegments of your pregnancy (trimesters). For most women, it is recommended to consume:  150 extra calories a day during the first trimester.  300 extra calories a day during the second trimester.  300 extra calories a day during the third trimester. What are tips for following this plan?   Do not try to lose weight or go on a diet during pregnancy.  Limit your overall intake of foods that have "empty calories." These are foods that have little nutritional value, such as sweets, desserts, candies, and sugar-sweetened beverages.  Eat a variety of foods (especially fruits and vegetables) to get a full range of vitamins and minerals.  Take a prenatal vitamin to help meet your additional vitamin and mineral needs during pregnancy, specifically for folic acid, iron, calcium, and vitamin D.  Remember to stay active. Ask your health care provider what types of exercise and activities are safe for you.  Practice good food safety and cleanliness. Wash your hands before you eat and after you prepare raw meat. Wash all fruits and vegetables well before peeling or eating. Taking these actions can help to prevent food-borne illnesses that can be very dangerous to your baby, such as listeriosis. Ask your health care provider for more information about listeriosis. What does 150 extra calories look like? Healthy options that provide 150 extra calories each day could be any of the following:  6-8 oz (170-230 g) of plain low-fat yogurt with  cup of berries.  1 apple with 2 teaspoons (11 g) of peanut butter.  Cut-up  vegetables with  cup (60 g) of hummus.  8 oz (230 mL) or 1 cup of low-fat chocolate milk.  1 stick of string cheese with 1 medium orange.  1 peanut butter and jelly sandwich that is made with one slice of whole-wheat bread and 1 tsp (5 g) of peanut butter. For 300 extra calories, you could eat two of those healthy options each day. What is a healthy amount of weight to gain? The right amount of weight gain for you is based on your BMI before you became pregnant. If your BMI:  Was less than 18 (underweight), you should gain 28-40 lb (13-18 kg).  Was 18-24.9 (normal), you should gain 25-35 lb (11-16 kg).  Was 25-29.9 (overweight), you should gain 15-25 lb (7-11 kg).  Was 30 or greater (obese), you should gain 11-20 lb (5-9 kg). What if I am having twins or multiples? Generally, if you are carrying twins or multiples:  You may need to eat 300-600 extra calories a day.  The recommended range for total weight gain is 25-54 lb (11-25 kg), depending on your BMI before pregnancy.  Talk with your health care provider to find out about nutritional needs, weight gain, and exercise that is right for you. What foods can I eat?  Fruits All fruits. Eat a variety of colors and types of fruit. Remember to wash your fruits well before peeling or eating. Vegetables All vegetables. Eat a variety of colors and types of vegetables. Remember to wash your vegetables well before peeling or eating. Grains All grains. Choose whole grains, such  as whole-wheat bread, oatmeal, or brown rice. Meats and other protein foods Lean meats, including chicken, [place], fish, and lean cuts of beef, veal, or pork. If you eat fish or seafood, choose options that are higher in omega-3 fatty acids and lower in mercury, such as salmon, herring, mussels, trout, sardines, pollock, shrimp, crab, and lobster. Tofu. Tempeh. Beans. Eggs. Peanut butter and other nut butters. Make sure that all meats, poultry, and eggs are cooked to  food-safe temperatures or "well-done." Two or more servings of fish are recommended each week in order to get the most benefits from omega-3 fatty acids that are found in seafood. Choose fish that are lower in mercury. You can find more information online:  [employer] Dairy Pasteurized milk and milk alternatives (such as almond milk). Pasteurized yogurt and pasteurized cheese. Cottage cheese. Sour cream. Beverages Water. Juices that contain 100% fruit juice or vegetable juice. Caffeine-free teas and decaffeinated coffee. Drinks that contain caffeine are okay to drink, but it is better to avoid caffeine. Keep your total caffeine intake to less than 200 mg each day (which is 12 oz or 355 mL of coffee, tea, or soda) or the limit as told by your health care provider. Fats and oils Fats and oils are okay to include in moderation. Sweets and desserts Sweets and desserts are okay to include in moderation. Seasoning and other foods All pasteurized condiments. The items listed above may not be a complete list of foods and beverages you can eat. Contact a dietitian for more information. What foods are not recommended? Fruits Unpasteurized fruit juices. Vegetables Raw (unpasteurized) vegetable juices. Meats and other protein foods Lunch meats, bologna, hot dogs, or other deli meats. (If you must eat those meats, reheat them until they are steaming hot.) Refrigerated pat, meat spreads from a meat counter, smoked seafood that is found in the refrigerated section of a store. Raw or undercooked meats, poultry, and eggs. Raw fish, such as sushi or sashimi. Fish that have high mercury content, such as tilefish, shark, swordfish, and king mackerel. To learn more about mercury in fish, talk with your health care provider or look for online resources, such as:  [employer] Dairy Raw (unpasteurized) milk and any foods that have raw milk in them. Soft cheeses, such as feta, queso blanco, queso fresco, Brie,  Camembert cheeses, blue-veined cheeses, and Panela cheese (unless it is made with pasteurized milk, which must be stated on the label). Beverages Alcohol. Sugar-sweetened beverages, such as sodas, teas, or energy drinks. Seasoning and other foods Homemade fermented foods and drinks, such as pickles, sauerkraut, or kombucha drinks. (Store-bought pasteurized versions of these are okay.) Salads that are made in a store or deli, such as ham salad, chicken salad, egg salad, tuna salad, and seafood salad. The items listed above may not be a complete list of foods and beverages you should avoid. Contact a dietitian for more information. Where to find more information To calculate the number of calories you need based on your height, weight, and activity level, you can use an online calculator such as:  [employer]Transition[employer]ch To calculate how much weight you should gain during pregnancy, you can use an online pregnancy weight gain calculator such as:  Streaming[employer].com.cy Summary  While you are pregnant, your body requires additional nutrition to help support your growing baby.  Eat a variety of foods, especially fruits and vegetables to get a full range of vitamins and minerals.  Practice good food safety and cleanliness. Wash your hands before you eat  and after you prepare raw meat. Wash all fruits and vegetables well before peeling or eating. Taking these actions can help to prevent food-borne illnesses, such as listeriosis, that can be very dangerous to your baby.  Do not eat raw meat or fish. Do not eat fish that have high mercury content, such as tilefish, shark, swordfish, and king mackerel. Do not eat unpasteurized (raw) dairy.  Take a prenatal vitamin to help meet your additional vitamin and mineral needs during pregnancy, specifically for folic acid, iron, calcium, and vitamin D. This information is not intended to replace advice given to you by  your health care provider. Make sure you discuss any questions you have with your health care provider. Document Revised: 05/26/2018 Document Reviewed: 10/02/2016 Elsevier Patient Education  [place] Maintenance, Female Adopting a healthy lifestyle and getting preventive care are important in promoting health and wellness. Ask your health care provider about:  The right schedule for you to have regular tests and exams.  Things you can do on your own to prevent diseases and keep yourself healthy. What should I know about diet, weight, and exercise? Eat a healthy diet   Eat a diet that includes plenty of vegetables, fruits, low-fat dairy products, and lean protein.  Do not eat a lot of foods that are high in solid fats, added sugars, or sodium. Maintain a healthy weight Body mass index (BMI) is used to identify weight problems. It estimates body fat based on height and weight. Your health care provider can help determine your BMI and help you achieve or maintain a healthy weight. Get regular exercise Get regular exercise. This is one of the most important things you can do for your health. Most adults should:  Exercise for at least 150 minutes each week. The exercise should increase your heart rate and make you sweat (moderate-intensity exercise).  Do strengthening exercises at least twice a week. This is in addition to the moderate-intensity exercise.  Spend less time sitting. Even light physical activity can be beneficial. Watch cholesterol and blood lipids Have your blood tested for lipids and cholesterol at 26 years of age, then have this test every 5 years. Have your cholesterol levels checked more often if:  Your lipid or cholesterol levels are high.  You are older than 26 years of age.  You are at high risk for heart disease. What should I know about cancer screening? Depending on your health history and family history, you may need to have cancer screening  at various ages. This may include screening for:  Breast cancer.  Cervical cancer.  Colorectal cancer.  Skin cancer.  Lung cancer. What should I know about heart disease, diabetes, and high blood pressure? Blood pressure and heart disease  High blood pressure causes heart disease and increases the risk of stroke. This is more likely to develop in people who have high blood pressure readings, are of African descent, or are overweight.  Have your blood pressure checked: ? Every 3-5 years if you are 39-97 years of age. ? Every year if you are 46 years old or older. Diabetes Have regular diabetes screenings. This checks your fasting blood sugar level. Have the screening done:  Once every three years after age 72 if you are at a normal weight and have a low risk for diabetes.  More often and at a younger age if you are overweight or have a high risk for diabetes. What should I know about preventing infection? Hepatitis B If  you have a higher risk for hepatitis B, you should be screened for this virus. Talk with your health care provider to find out if you are at risk for hepatitis B infection. Hepatitis C Testing is recommended for:  Everyone born from [birth year] through 1967.  Anyone with known risk factors for hepatitis C. Sexually transmitted infections (STIs)  Get screened for STIs, including gonorrhea and chlamydia, if: ? You are sexually active and are younger than 26 years of age. ? You are older than 26 years of age and your health care provider tells you that you are at risk for this type of infection. ? Your sexual activity has changed since you were last screened, and you are at increased risk for chlamydia or gonorrhea. Ask your health care provider if you are at risk.  Ask your health care provider about whether you are at high risk for HIV. Your health care provider may recommend a prescription medicine to help prevent HIV infection. If you choose to take medicine to  prevent HIV, you should first get tested for HIV. You should then be tested every 3 months for as long as you are taking the medicine. Pregnancy  If you are about to stop having your period (premenopausal) and you may become pregnant, seek counseling before you get pregnant.  Take 400 to 800 micrograms (mcg) of folic acid every day if you become pregnant.  Ask for birth control (contraception) if you want to prevent pregnancy. Osteoporosis and menopause Osteoporosis is a disease in which the bones lose minerals and strength with aging. This can result in bone fractures. If you are 53 years old or older, or if you are at risk for osteoporosis and fractures, ask your health care provider if you should:  Be screened for bone loss.  Take a calcium or vitamin D supplement to lower your risk of fractures.  Be given hormone replacement therapy (HRT) to treat symptoms of menopause. Follow these instructions at home: Lifestyle  Do not use any products that contain nicotine or tobacco, such as cigarettes, e-cigarettes, and chewing tobacco. If you need help quitting, ask your health care provider.  Do not use street drugs.  Do not share needles.  Ask your health care provider for help if you need support or information about quitting drugs. Alcohol use  Do not drink alcohol if: ? Your health care provider tells you not to drink. ? You are pregnant, may be pregnant, or are planning to become pregnant.  If you drink alcohol: ? Limit how much you use to 0-1 drink a day. ? Limit intake if you are breastfeeding.  Be aware of how much alcohol is in your drink. In the [place] one drink equals one 12 oz bottle of beer (355 mL), one 5 oz glass of wine (148 mL), or one 1 oz glass of hard liquor (44 mL). General instructions  Schedule regular health, dental, and eye exams.  Stay current with your vaccines.  Tell your health care provider if: ? You often feel depressed. ? You have ever been  abused or do not feel safe at home. Summary  Adopting a healthy lifestyle and getting preventive care are important in promoting health and wellness.  Follow your health care provider's instructions about healthy diet, exercising, and getting tested or screened for diseases.  Follow your health care provider's instructions on monitoring your cholesterol and blood pressure. This information is not intended to replace advice given to you by your health care provider.  Make sure you discuss any questions you have with your health care provider. Document Revised: 12/29/2017 Document Reviewed: 12/29/2017 Elsevier Patient Education  2020 [employer].

## 2019-02-08 ENCOUNTER — Other Ambulatory Visit: Payer: Self-pay

## 2019-02-08 ENCOUNTER — Ambulatory Visit (INDEPENDENT_AMBULATORY_CARE_PROVIDER_SITE_OTHER): Payer: BC Managed Care – PPO

## 2019-02-08 DIAGNOSIS — N912 Amenorrhea, unspecified: Secondary | ICD-10-CM

## 2019-02-08 DIAGNOSIS — N8311 Corpus luteum cyst of right ovary: Secondary | ICD-10-CM | POA: Diagnosis not present

## 2019-02-08 DIAGNOSIS — Z3A08 8 weeks gestation of pregnancy: Secondary | ICD-10-CM | POA: Diagnosis not present

## 2019-02-08 DIAGNOSIS — O3481 Maternal care for other abnormalities of pelvic organs, first trimester: Secondary | ICD-10-CM

## 2019-02-14 NOTE — Patient Instructions (Signed)
First Trimester of Pregnancy The first trimester of pregnancy is from week 1 until the end of week 13 (months 1 through 3). A week after a sperm fertilizes an egg, the egg will implant on the wall of the uterus. This embryo will begin to develop into a baby. Genes from you and your partner will form the baby. The female genes will determine whether the baby will be a boy or a girl. At 6-8 weeks, the eyes and face will be formed, and the heartbeat can be seen on ultrasound. At the end of 12 weeks, all the baby's organs will be formed. Now that you are pregnant, you will want to do everything you can to have a healthy baby. Two of the most important things are to get good prenatal care and to follow your health care provider's instructions. Prenatal care is all the medical care you receive before the baby's birth. This care will help prevent, find, and treat any problems during the pregnancy and childbirth. Body changes during your first trimester Your body goes through many changes during pregnancy. The changes vary from woman to woman.  You may gain or lose a couple of pounds at first.  You may feel sick to your stomach (nauseous) and you may throw up (vomit). If the vomiting is uncontrollable, call your health care provider.  You may tire easily.  You may develop headaches that can be relieved by medicines. All medicines should be approved by your health care provider.  You may urinate more often. Painful urination may mean you have a bladder infection.  You may develop heartburn as a result of your pregnancy.  You may develop constipation because certain hormones are causing the muscles that push stool through your intestines to slow down.  You may develop hemorrhoids or swollen veins (varicose veins).  Your breasts may begin to grow larger and become tender. Your nipples may stick out more, and the tissue that surrounds them (areola) may become darker.  Your gums may bleed and may be  sensitive to brushing and flossing.  Dark spots or blotches (chloasma, mask of pregnancy) may develop on your face. This will likely fade after the baby is born.  Your menstrual periods will stop.  You may have a loss of appetite.  You may develop cravings for certain kinds of food.  You may have changes in your emotions from day to day, such as being excited to be pregnant or being concerned that something may go wrong with the pregnancy and baby.  You may have more vivid and strange dreams.  You may have changes in your hair. These can include thickening of your hair, rapid growth, and changes in texture. Some women also have hair loss during or after pregnancy, or hair that feels dry or thin. Your hair will most likely return to normal after your baby is born. What to expect at prenatal visits During a routine prenatal visit:  You will be weighed to make sure you and the baby are growing normally.  Your blood pressure will be taken.  Your abdomen will be measured to track your baby's growth.  The fetal heartbeat will be listened to between weeks 10 and 14 of your pregnancy.  Test results from any previous visits will be discussed. Your health care provider may ask you:  How you are feeling.  If you are feeling the baby move.  If you have had any abnormal symptoms, such as leaking fluid, bleeding, severe headaches, or abdominal   cramping.  If you are using any tobacco products, including cigarettes, chewing tobacco, and electronic cigarettes.  If you have any questions. Other tests that may be performed during your first trimester include:  Blood tests to find your blood type and to check for the presence of any previous infections. The tests will also be used to check for low iron levels (anemia) and protein on red blood cells (Rh antibodies). Depending on your risk factors, or if you previously had diabetes during pregnancy, you may have tests to check for high blood sugar  that affects pregnant women (gestational diabetes).  Urine tests to check for infections, diabetes, or protein in the urine.  An ultrasound to confirm the proper growth and development of the baby.  Fetal screens for spinal cord problems (spina bifida) and Down syndrome.  HIV (human immunodeficiency virus) testing. Routine prenatal testing includes screening for HIV, unless you choose not to have this test.  You may need other tests to make sure you and the baby are doing well. Follow these instructions at home: Medicines  Follow your health care provider's instructions regarding medicine use. Specific medicines may be either safe or unsafe to take during pregnancy.  Take a prenatal vitamin that contains at least 600 micrograms (mcg) of folic acid.  If you develop constipation, try taking a stool softener if your health care provider approves. Eating and drinking   Eat a balanced diet that includes fresh fruits and vegetables, whole grains, good sources of protein such as meat, eggs, or tofu, and low-fat dairy. Your health care provider will help you determine the amount of weight gain that is right for you.  Avoid raw meat and uncooked cheese. These carry germs that can cause birth defects in the baby.  Eating four or five small meals rather than three large meals a day may help relieve nausea and vomiting. If you start to feel nauseous, eating a few soda crackers can be helpful. Drinking liquids between meals, instead of during meals, also seems to help ease nausea and vomiting.  Limit foods that are high in fat and processed sugars, such as fried and sweet foods.  To prevent constipation: ? Eat foods that are high in fiber, such as fresh fruits and vegetables, whole grains, and beans. ? Drink enough fluid to keep your urine clear or pale yellow. Activity  Exercise only as directed by your health care provider. Most women can continue their usual exercise routine during  pregnancy. Try to exercise for 30 minutes at least 5 days a week. Exercising will help you: ? Control your weight. ? Stay in shape. ? Be prepared for labor and delivery.  Experiencing pain or cramping in the lower abdomen or lower back is a good sign that you should stop exercising. Check with your health care provider before continuing with normal exercises.  Try to avoid standing for long periods of time. Move your legs often if you must stand in one place for a long time.  Avoid heavy lifting.  Wear low-heeled shoes and practice good posture.  You may continue to have sex unless your health care provider tells you not to. Relieving pain and discomfort  Wear a good support bra to relieve breast tenderness.  Take warm sitz baths to soothe any pain or discomfort caused by hemorrhoids. Use hemorrhoid cream if your health care provider approves.  Rest with your legs elevated if you have leg cramps or low back pain.  If you develop varicose veins in   your legs, wear support hose. Elevate your feet for 15 minutes, 3-4 times a day. Limit salt in your diet. Prenatal care  Schedule your prenatal visits by the twelfth week of pregnancy. They are usually scheduled monthly at first, then more often in the last 2 months before delivery.  Write down your questions. Take them to your prenatal visits.  Keep all your prenatal visits as told by your health care provider. This is important. Safety  Wear your seat belt at all times when driving.  Make a list of emergency phone numbers, including numbers for family, friends, the hospital, and police and fire departments. General instructions  Ask your health care provider for a referral to a local prenatal education class. Begin classes no later than the beginning of month 6 of your pregnancy.  Ask for help if you have counseling or nutritional needs during pregnancy. Your health care provider can offer advice or refer you to specialists for help  with various needs.  Do not use hot tubs, steam rooms, or saunas.  Do not douche or use tampons or scented sanitary pads.  Do not cross your legs for long periods of time.  Avoid cat litter boxes and soil used by cats. These carry germs that can cause birth defects in the baby and possibly loss of the fetus by miscarriage or stillbirth.  Avoid all smoking, herbs, alcohol, and medicines not prescribed by your health care provider. Chemicals in these products affect the formation and growth of the baby.  Do not use any products that contain nicotine or tobacco, such as cigarettes and e-cigarettes. If you need help quitting, ask your health care provider. You may receive counseling support and other resources to help you quit.  Schedule a dentist appointment. At home, brush your teeth with a soft toothbrush and be gentle when you floss. Contact a health care provider if:  You have dizziness.  You have mild pelvic cramps, pelvic pressure, or nagging pain in the abdominal area.  You have persistent nausea, vomiting, or diarrhea.  You have a bad smelling vaginal discharge.  You have pain when you urinate.  You notice increased swelling in your face, hands, legs, or ankles.  You are exposed to fifth disease or chickenpox.  You are exposed to German measles (rubella) and have never had it. Get help right away if:  You have a fever.  You are leaking fluid from your vagina.  You have spotting or bleeding from your vagina.  You have severe abdominal cramping or pain.  You have rapid weight gain or loss.  You vomit blood or material that looks like coffee grounds.  You develop a severe headache.  You have shortness of breath.  You have any kind of trauma, such as from a fall or a car accident. Summary  The first trimester of pregnancy is from week 1 until the end of week 13 (months 1 through 3).  Your body goes through many changes during pregnancy. The changes vary from  woman to woman.  You will have routine prenatal visits. During those visits, your health care provider will examine you, discuss any test results you may have, and talk with you about how you are feeling. This information is not intended to replace advice given to you by your health care provider. Make sure you discuss any questions you have with your health care provider. Document Revised: 12/18/2016 Document Reviewed: 12/18/2015 Elsevier Patient Education  2020 [employer]  Commonly Asked Questions During Pregnancy    Cats: A parasite can be excreted in cat feces.  To avoid exposure you need to have another person empty the little box.  If you must empty the litter box you will need to wear gloves.  Wash your hands after handling your cat.  This parasite can also be found in raw or undercooked meat so this should also be avoided.  Colds, Sore Throats, Flu: Please check your medication sheet to see what you can take for symptoms.  If your symptoms are unrelieved by these medications please call the office.  Dental Work: Most any dental work your dentist recommends is permitted.  X-rays should only be taken during the first trimester if absolutely necessary.  Your abdomen should be shielded with a lead apron during all x-rays.  Please notify your provider prior to receiving any x-rays.  Novocaine is fine; gas is not recommended.  If your dentist requires a note from us prior to dental work please call the office and we will provide one for you.  Exercise: Exercise is an important part of staying healthy during your pregnancy.  You may continue most exercises you were accustomed to prior to pregnancy.  Later in your pregnancy you will most likely notice you have difficulty with activities requiring balance like riding a bicycle.  It is important that you listen to your body and avoid activities that put you at a higher risk of falling.  Adequate rest and staying well hydrated are a must!  If you have  questions about the safety of specific activities ask your provider.    Exposure to Children with illness: Try to avoid obvious exposure; report any symptoms to us when noted,  If you have chicken pos, red measles or mumps, you should be immune to these diseases.   Please do not take any vaccines while pregnant unless you have checked with your OB provider.  Fetal Movement: After 28 weeks we recommend you do "kick counts" twice daily.  Lie or sit down in a calm quiet environment and count your baby movements "kicks".  You should feel your baby at least 10 times per hour.  If you have not felt 10 kicks within the first hour get up, walk around and have something sweet to eat or drink then repeat for an additional hour.  If count remains less than 10 per hour notify your provider.  Fumigating: Follow your pest control agent's advice as to how long to stay out of your home.  Ventilate the area well before re-entering.  Hemorrhoids:   Most over-the-counter preparations can be used during pregnancy.  Check your medication to see what is safe to use.  It is important to use a stool softener or fiber in your diet and to drink lots of liquids.  If hemorrhoids seem to be getting worse please call the office.   Hot Tubs:  Hot tubs Jacuzzis and saunas are not recommended while pregnant.  These increase your internal body temperature and should be avoided.  Intercourse:  Sexual intercourse is safe during pregnancy as long as you are comfortable, unless otherwise advised by your provider.  Spotting may occur after intercourse; report any bright red bleeding that is heavier than spotting.  Labor:  If you know that you are in labor, please go to the hospital.  If you are unsure, please call the office and let us help you decide what to do.  Lifting, straining, etc:  If your job requires heavy lifting or straining please check with   your provider for any limitations.  Generally, you should not lift items heavier than  that you can lift simply with your hands and arms (no back muscles)  Painting:  Paint fumes do not harm your pregnancy, but may make you ill and should be avoided if possible.  Latex or water based paints have less odor than oils.  Use adequate ventilation while painting.  Permanents & Hair Color:  Chemicals in hair dyes are not recommended as they cause increase hair dryness which can increase hair loss during pregnancy.  " Highlighting" and permanents are allowed.  Dye may be absorbed differently and permanents may not hold as well during pregnancy.  Sunbathing:  Use a sunscreen, as skin burns easily during pregnancy.  Drink plenty of fluids; avoid over heating.  Tanning Beds:  Because their possible side effects are still unknown, tanning beds are not recommended.  Ultrasound Scans:  Routine ultrasounds are performed at approximately 20 weeks.  You will be able to see your baby's general anatomy an if you would like to know the gender this can usually be determined as well.  If it is questionable when you conceived you may also receive an ultrasound early in your pregnancy for dating purposes.  Otherwise ultrasound exams are not routinely performed unless there is a medical necessity.  Although you can request a scan we ask that you pay for it when conducted because insurance does not cover " patient request" scans.  Work: If your pregnancy proceeds without complications you may work until your due date, unless your physician or employer advises otherwise.  Round Ligament Pain/Pelvic Discomfort:  Sharp, shooting pains not associated with bleeding are fairly common, usually occurring in the second trimester of pregnancy.  They tend to be worse when standing up or when you remain standing for long periods of time.  These are the result of pressure of certain pelvic ligaments called "round ligaments".  Rest, Tylenol and heat seem to be the most effective relief.  As the womb and fetus grow, they rise  out of the pelvis and the discomfort improves.  Please notify the office if your pain seems different than that described.  It may represent a more serious condition.   How a Baby Grows During Pregnancy  Pregnancy begins when a female's sperm enters a female's egg (fertilization). Fertilization usually happens in one of the tubes (fallopian tubes) that connect the ovaries to the womb (uterus). The fertilized egg moves down the fallopian tube to the uterus. Once it reaches the uterus, it implants into the lining of the uterus and begins to grow. For the first 10 weeks, the fertilized egg is called an embryo. After 10 weeks, it is called a fetus. As the fetus continues to grow, it receives oxygen and nutrients through tissue (placenta) that grows to support the developing baby. The placenta is the life support system for the baby. It provides oxygen and nutrition and removes waste. Learning as much as you can about your pregnancy and how your baby is developing can help you enjoy the experience. It can also make you aware of when there might be a problem and when to ask questions. How long does a typical pregnancy last? A pregnancy usually lasts 280 days, or about 40 weeks. Pregnancy is divided into three periods of growth, also called trimesters:  First trimester: 0-12 weeks.  Second trimester: 13-27 weeks.  Third trimester: 28-40 weeks. The day when your baby is ready to be born (full term) is   your estimated date of delivery. How does my baby develop month by month? First month  The fertilized egg attaches to the inside of the uterus.  Some cells will form the placenta. Others will form the fetus.  The arms, legs, brain, spinal cord, lungs, and heart begin to develop.  At the end of the first month, the heart begins to beat. Second month  The bones, inner ear, eyelids, hands, and feet form.  The genitals develop.  By the end of 8 weeks, all major organs are developing. Third  month  All of the internal organs are forming.  Teeth develop below the gums.  Bones and muscles begin to grow. The spine can flex.  The skin is transparent.  Fingernails and toenails begin to form.  Arms and legs continue to grow longer, and hands and feet develop.  The fetus is about 3 inches (7.6 cm) long. Fourth month  The placenta is completely formed.  The external sex organs, neck, outer ear, eyebrows, eyelids, and fingernails are formed.  The fetus can hear, swallow, and move its arms and legs.  The kidneys begin to produce urine.  The skin is covered with a white, waxy coating (vernix) and very fine hair (lanugo). Fifth month  The fetus moves around more and can be felt for the first time (quickening).  The fetus starts to sleep and wake up and may begin to suck its finger.  The nails grow to the end of the fingers.  The organ in the digestive system that makes bile (gallbladder) functions and helps to digest nutrients.  If your baby is a girl, eggs are present in her ovaries. If your baby is a boy, testicles start to move down into his scrotum. Sixth month  The lungs are formed.  The eyes open. The brain continues to develop.  Your baby has fingerprints and toe prints. Your baby's hair grows thicker.  At the end of the second trimester, the fetus is about 9 inches (22.9 cm) long. Seventh month  The fetus kicks and stretches.  The eyes are developed enough to sense changes in light.  The hands can make a grasping motion.  The fetus responds to sound. Eighth month  All organs and body systems are fully developed and functioning.  Bones harden, and taste buds develop. The fetus may hiccup.  Certain areas of the brain are still developing. The skull remains soft. Ninth month  The fetus gains about  lb (0.23 kg) each week.  The lungs are fully developed.  Patterns of sleep develop.  The fetus's head typically moves into a head-down position  (vertex) in the uterus to prepare for birth.  The fetus weighs 6-9 lb (2.72-4.08 kg) and is 19-20 inches (48.26-50.8 cm) long. What can I do to have a healthy pregnancy and help my baby develop? General instructions  Take prenatal vitamins as directed by your health care provider. These include vitamins such as folic acid, iron, calcium, and vitamin D. They are important for healthy development.  Take medicines only as directed by your health care provider. Read labels and ask a pharmacist or your health care provider whether over-the-counter medicines, supplements, and prescription drugs are safe to take during pregnancy.  Keep all follow-up visits as directed by your health care provider. This is important. Follow-up visits include prenatal care and screening tests. How do I know if my baby is developing well? At each prenatal visit, your health care provider will do several different tests to   check on your health and keep track of your baby's development. These include:  Fundal height and position. ? Your health care provider will measure your growing belly from your pubic bone to the top of the uterus using a tape measure. ? Your health care provider will also feel your belly to determine your baby's position.  Heartbeat. ? An ultrasound in the first trimester can confirm pregnancy and show a heartbeat, depending on how far along you are. ? Your health care provider will check your baby's heart rate at every prenatal visit.  Second trimester ultrasound. ? This ultrasound checks your baby's development. It also may show your baby's gender. What should I do if I have concerns about my baby's development? Always talk with your health care provider about any concerns that you may have about your pregnancy and your baby. Summary  A pregnancy usually lasts 280 days, or about 40 weeks. Pregnancy is divided into three periods of growth, also called trimesters.  Your health care provider  will monitor your baby's growth and development throughout your pregnancy.  Follow your health care provider's recommendations about taking prenatal vitamins and medicines during your pregnancy.  Talk with your health care provider if you have any concerns about your pregnancy or your developing baby. This information is not intended to replace advice given to you by your health care provider. Make sure you discuss any questions you have with your health care provider. Document Revised: 04/28/2018 Document Reviewed: 11/18/2016 Elsevier Patient Education  2020 [employer]  

## 2019-02-15 ENCOUNTER — Ambulatory Visit (INDEPENDENT_AMBULATORY_CARE_PROVIDER_SITE_OTHER): Payer: BC Managed Care – PPO | Admitting: Obstetrics and Gynecology

## 2019-02-15 ENCOUNTER — Other Ambulatory Visit: Payer: Self-pay

## 2019-02-15 VITALS — BP 112/77 | HR 92 | Ht 62.0 in | Wt 145.0 lb

## 2019-02-15 DIAGNOSIS — Z0283 Encounter for blood-alcohol and blood-drug test: Secondary | ICD-10-CM

## 2019-02-15 DIAGNOSIS — Z3491 Encounter for supervision of normal pregnancy, unspecified, first trimester: Secondary | ICD-10-CM

## 2019-02-15 DIAGNOSIS — Z113 Encounter for screening for infections with a predominantly sexual mode of transmission: Secondary | ICD-10-CM

## 2019-02-15 LAB — OB RESULTS CONSOLE GC/CHLAMYDIA: Gonorrhea: NEGATIVE

## 2019-02-15 LAB — OB RESULTS CONSOLE VARICELLA ZOSTER ANTIBODY, IGG: Varicella: IMMUNE

## 2019-02-15 NOTE — Progress Notes (Signed)
error 

## 2019-02-15 NOTE — Progress Notes (Signed)
[name] presents for NOB nurse interview visit.  Pregnancy confirmation done at [facility].  G-2.  P-1.  LMP 12/13/2018.  EDD 09/18/2019. Dating scan done 02/08/2019 [facility] [redacted]w[redacted]d.  Pregnancy education material explained and given.  No cats in the home.  NOB labs ordered.  TSH/HgA1C not ordered.  Body mass index is 26.52 kg/m.  Sickle cell not ordered. HIV and drug screen were explained and ordered.  PNV encouraged.  Genetic screening options discussed.  Genetic testing: Unsure.  FMLA form signed and financial policy reviewed.  RTC in 3 weeks for NOB physical with Dr. Valentino Saxon per patient request.    FOB with G6PD deficiency.  Patient c/o vaginal irritation x1 week, no vaginal discharge.  Patient states "feels like a yeast infection".  Advised patient to use Monistat 7 day treatment and let us know if symptoms persist or get worse.  Patient verbalized understanding.

## 2019-02-16 LAB — MONITOR DRUG PROFILE 14(MW)
Amphetamine Scrn, Ur: NEGATIVE ng/mL
BARBITURATE SCREEN URINE: NEGATIVE ng/mL
BENZODIAZEPINE SCREEN, URINE: NEGATIVE ng/mL
Buprenorphine, Urine: NEGATIVE ng/mL
CANNABINOIDS UR QL SCN: NEGATIVE ng/mL
Cocaine (Metab) Scrn, Ur: NEGATIVE ng/mL
Creatinine(Crt), U: 28.1 mg/dL (ref 20.0–300.0)
Fentanyl, Urine: NEGATIVE pg/mL
Meperidine Screen, Urine: NEGATIVE ng/mL
Methadone Screen, Urine: NEGATIVE ng/mL
OXYCODONE+OXYMORPHONE UR QL SCN: NEGATIVE ng/mL
Opiate Scrn, Ur: NEGATIVE ng/mL
Ph of Urine: 6.3 (ref 4.5–8.9)
Phencyclidine Qn, Ur: NEGATIVE ng/mL
Propoxyphene Scrn, Ur: NEGATIVE ng/mL
SPECIFIC GRAVITY: 1.009
Tramadol Screen, Urine: NEGATIVE ng/mL

## 2019-02-16 LAB — URINALYSIS, ROUTINE W REFLEX MICROSCOPIC
Bilirubin, UA: NEGATIVE
Glucose, UA: NEGATIVE
Ketones, UA: NEGATIVE
Leukocytes,UA: NEGATIVE
Nitrite, UA: NEGATIVE
Protein,UA: NEGATIVE
RBC, UA: NEGATIVE
Specific Gravity, UA: 1.008 (ref 1.005–1.030)
Urobilinogen, Ur: 0.2 mg/dL (ref 0.2–1.0)
pH, UA: 6.5 (ref 5.0–7.5)

## 2019-02-16 LAB — NICOTINE SCREEN, URINE: Cotinine Ql Scrn, Ur: NEGATIVE ng/mL

## 2019-02-17 ENCOUNTER — Other Ambulatory Visit: Payer: Self-pay

## 2019-02-17 LAB — RUBELLA SCREEN: Rubella Antibodies, IGG: 0.95 index — ABNORMAL LOW (ref >0.99)

## 2019-02-17 LAB — HIV ANTIBODY (ROUTINE TESTING W REFLEX): HIV Screen 4th Generation wRfx: NONREACTIVE

## 2019-02-17 LAB — GC/CHLAMYDIA PROBE AMP
Chlamydia trachomatis, NAA: NEGATIVE
Neisseria Gonorrhoeae by PCR: NEGATIVE

## 2019-02-17 LAB — ABO AND RH: Rh Factor: POSITIVE

## 2019-02-17 LAB — HEPATITIS B SURFACE ANTIGEN: Hepatitis B Surface Ag: NEGATIVE

## 2019-02-17 LAB — HGB SOLU + RFLX FRAC: Sickle Solubility Test - HGBRFX: NEGATIVE

## 2019-02-17 LAB — ANTIBODY SCREEN

## 2019-02-17 LAB — VARICELLA ZOSTER ANTIBODY, IGG: Varicella zoster IgG: 571 index (ref >165)

## 2019-02-17 LAB — RPR: RPR Ser Ql: NONREACTIVE

## 2019-02-17 LAB — AB SCR+ANTIBODY ID

## 2019-02-17 MED ORDER — PROMETHAZINE HCL 25 MG PO TABS: 25.0000 mg | ORAL_TABLET | Freq: Four times a day (QID) | ORAL | 1 refills | Status: DC | PRN

## 2019-02-19 LAB — URINE CULTURE, OB REFLEX

## 2019-02-19 LAB — CULTURE, OB URINE

## 2019-03-08 ENCOUNTER — Ambulatory Visit (INDEPENDENT_AMBULATORY_CARE_PROVIDER_SITE_OTHER): Payer: BC Managed Care – PPO | Admitting: Obstetrics and Gynecology

## 2019-03-08 ENCOUNTER — Encounter: Payer: Self-pay | Admitting: Obstetrics and Gynecology

## 2019-03-08 ENCOUNTER — Other Ambulatory Visit: Payer: Self-pay

## 2019-03-08 ENCOUNTER — Ambulatory Visit: Payer: BC Managed Care – PPO | Admitting: Obstetrics and Gynecology

## 2019-03-08 VITALS — BP 114/76 | HR 96 | Wt 148.4 lb

## 2019-03-08 VITALS — BP 114/76 | HR 96 | Ht 62.0 in | Wt 148.4 lb

## 2019-03-08 DIAGNOSIS — R748 Abnormal levels of other serum enzymes: Secondary | ICD-10-CM

## 2019-03-08 DIAGNOSIS — Z8659 Personal history of other mental and behavioral disorders: Secondary | ICD-10-CM

## 2019-03-08 DIAGNOSIS — Z3481 Encounter for supervision of other normal pregnancy, first trimester: Secondary | ICD-10-CM

## 2019-03-08 DIAGNOSIS — E663 Overweight: Secondary | ICD-10-CM

## 2019-03-08 LAB — POCT URINALYSIS DIPSTICK OB
Bilirubin, UA: NEGATIVE
Blood, UA: NEGATIVE
Glucose, UA: NEGATIVE
Ketones, UA: NEGATIVE
Leukocytes, UA: NEGATIVE
Nitrite, UA: NEGATIVE
Spec Grav, UA: 1.02 (ref 1.010–1.025)
Urobilinogen, UA: 0.2 E.U./dL
pH, UA: 6.5 (ref 5.0–8.0)

## 2019-03-08 MED ORDER — CITALOPRAM HYDROBROMIDE 20 MG PO TABS: 20.0000 mg | ORAL_TABLET | Freq: Every day | ORAL | 4 refills | Status: DC

## 2019-03-08 NOTE — Progress Notes (Signed)
OBSTETRIC INITIAL PRENATAL VISIT  Subjective:    Rose Howard is being seen today for her first obstetrical visit.  This is a planned pregnancy. She is a 24 y.o. G16P1001 female at [redacted]w[redacted]d gestation, Estimated Date of Delivery: 09/19/19 with Patient's last menstrual period was 12/13/2018, consistent with 8 week sono. Her obstetrical history is significant for history of depression. Relationship with FOB: spouse, living together. Patient does intend to breast feed. Pregnancy history fully reviewed.  Patient notes that she thinks she may be experiencing symptoms of depression (Celexa). Was on medication prior to attempting to conceive but discontinued. Feeling more withdrawn, hopeless. Denies SI/HI.  PHQ-9 score is 15 today.     OB History  Gravida Para Term Preterm AB Living  2 1 1  0 0 1  SAB TAB Ectopic Multiple Live Births  0 0 0 0 1    # Outcome Date GA Lbr Len/2nd Weight Sex Delivery Anes PTL Lv  2 Current           1 Term 12/08/17 [redacted]w[redacted]d / 00:54 7 lb 0.9 oz (3.2 kg) M Vag-Vacuum EPI  LIV     Name: Alroy Dust      Apgar1: 8  Apgar5: 9    Gynecologic History:  Last pap smear was 05/2017.  Results were normal.  Denies h/o abnormal pap smears in the past.  Denies history of STIs.  Contraception: None  Past Medical History:  Diagnosis Date  . Abnormal liver enzymes   . Anxiety   . Depression   . Dysmenorrhea   . GERD (gastroesophageal reflux disease)     Family History  Problem Relation Age of Onset  . Heart disease Father   . Alcohol abuse Father   . Hypercholesterolemia Mother   . Breast cancer Neg Hx   . Ovarian cancer Neg Hx   . Colon cancer Neg Hx     Past Surgical History:  Procedure Laterality Date  . BREAST SURGERY Bilateral 2014   Breast Reduction  . LIVER BIOPSY  08/10/14    Social History   Socioeconomic History  . Marital status: Married    Spouse name: Not on file  . Number of children: Not on file  . Years of education: Not on file  . Highest  education level: Not on file  Occupational History  . Occupation: Quarry manager  Tobacco Use  . Smoking status: Never Smoker  . Smokeless tobacco: Never Used  Substance and Sexual Activity  . Alcohol use: Not Currently  . Drug use: Not Currently    Types: Marijuana  . Sexual activity: Yes    Birth control/protection: None  Other Topics Concern  . Not on file  Social History Narrative  . Not on file   Social Determinants of Health   Financial Resource Strain:   . Difficulty of Paying Living Expenses: Not on file  Food Insecurity:   . Worried About Charity fundraiser in the Last Year: Not on file  . Ran Out of Food in the Last Year: Not on file  Transportation Needs:   . Lack of Transportation (Medical): Not on file  . Lack of Transportation (Non-Medical): Not on file  Physical Activity:   . Days of Exercise per Week: Not on file  . Minutes of Exercise per Session: Not on file  Stress:   . Feeling of Stress : Not on file  Social Connections:   . Frequency of Communication with Friends and Family: Not on file  .  Frequency of Social Gatherings with Friends and Family: Not on file  . Attends Religious Services: Not on file  . Active Member of Clubs or Organizations: Not on file  . Attends Banker Meetings: Not on file  . Marital Status: Not on file  Intimate Partner Violence:   . Fear of Current or Ex-Partner: Not on file  . Emotionally Abused: Not on file  . Physically Abused: Not on file  . Sexually Abused: Not on file    Current Outpatient Medications on File Prior to Visit  Medication Sig Dispense Refill  . acetaminophen (TYLENOL) 500 MG tablet Take 500 mg by mouth as needed.    Marland Kitchen albuterol (VENTOLIN HFA) 108 (90 Base) MCG/ACT inhaler Inhale 2 puffs into the lungs every 6 (six) hours as needed for wheezing or shortness of breath. 8 g 0  . Prenatal MV-Min-FA-Omega-3 (PRENATAL GUMMIES/DHA & FA PO) Take 2 each by mouth daily.    . promethazine (PHENERGAN) 25 MG  tablet Take 1 tablet (25 mg total) by mouth every 6 (six) hours as needed for nausea or vomiting. 30 tablet 1   No current facility-administered medications on file prior to visit.    No Known Allergies   Review of Systems General: Not Present- Fever, Weight Loss and Weight Gain. Skin: Not Present- Rash. HEENT: Not Present- Blurred Vision, Headache and Bleeding Gums. Respiratory: Not Present- Difficulty Breathing. Breast: Not Present- Breast Mass. Cardiovascular: Not Present- Chest Pain, Elevated Blood Pressure, Fainting / Blacking Out and Shortness of Breath. Gastrointestinal: Not Present- Abdominal Pain, Constipation. Present - Nausea and Vomiting (mild). Female Genitourinary: Not Present- Frequency, Painful Urination, Pelvic Pain, Vaginal Bleeding, Vaginal Discharge, Contractions, regular, Fetal Movements Decreased, Urinary Complaints and Vaginal Fluid. Musculoskeletal: Not Present- Back Pain and Leg Cramps. Neurological: Not Present- Dizziness. Psychiatric: Present- Depression.     Objective:   Blood pressure 114/76, pulse 96, weight 148 lb 6.4 oz (67.3 kg), last menstrual period 12/13/2018, not currently breastfeeding. Body mass index is 27.14 kg/m.  General Appearance:    Alert, cooperative, no distress, appears stated age, overweight  Head:    Normocephalic, without obvious abnormality, atraumatic  Eyes:    PERRL, conjunctiva/corneas clear, EOM's intact, both eyes  Ears:    Normal external ear canals, both ears  Nose:   Nares normal, septum midline, mucosa normal, no drainage or sinus tenderness  Throat:   Lips, mucosa, and tongue normal; teeth and gums normal  Neck:   Supple, symmetrical, trachea midline, no adenopathy; thyroid: no enlargement/tenderness/nodules; no carotid bruit or JVD  Back:     Symmetric, no curvature, ROM normal, no CVA tenderness  Lungs:     Clear to auscultation bilaterally, respirations unlabored  Chest Wall:    No tenderness or deformity   Heart:     Regular rate and rhythm, S1 and S2 normal, no murmur, rub or gallop  Breast Exam:    No tenderness, masses, or nipple abnormality  Abdomen:     Soft, non-tender, bowel sounds active all four quadrants, no masses, no organomegaly.  FH 12.  FHT 160  bpm.  Genitalia:    Pelvic:external genitalia normal, vagina without lesions, discharge, or tenderness, rectovaginal septum  normal. Cervix normal in appearance, no cervical motion tenderness, no adnexal masses or tenderness.  Pregnancy positive findings: uterine enlargement: 12 wk size, nontender.   Rectal:    Normal external sphincter.  No hemorrhoids appreciated. Internal exam not done.   Extremities:   Extremities normal, atraumatic, no cyanosis or  edema  Pulses:   2+ and symmetric all extremities  Skin:   Skin color, texture, turgor normal, no rashes or lesions  Lymph nodes:   Cervical, supraclavicular, and axillary nodes normal  Neurologic:   CNII-XII intact, normal strength, sensation and reflexes throughout      Assessment:   1. Encounter for supervision of other normal pregnancy in first trimester   2. History of depression   3. Abnormal liver enzymes   4. Overweight (BMI 25.0-29.9)     Plan:   1. Supervision of normal pregnancy  -  Initial labs reviewed. - Prenatal vitamins encouraged. - Problem list reviewed and updated. - New OB counseling:  The patient has been given an overview regarding routine prenatal care.  Recommendations regarding diet, weight gain, and exercise in pregnancy were given. - Prenatal testing, optional genetic testing, and ultrasound use in pregnancy were reviewed.  Panorama ordered: ordered. - Benefits of Breast Feeding were discussed. The patient is encouraged to consider nursing her baby post partum.  2. History of depression  - Patient noting depression symptoms again. Discussed option of resuming her Celexa, initiation of counseling, or both. Desires both.  Will restart Celexa 20 mg.  Discussed  Category C medication in pregnancy.  Also will place referral for counseling.   3. Abnormal liver enzymes - Patient has had a history of abnormal enzymes for several years. Has undergone prior workup in the past (including 2 liver biopsies) with negative findings.  Will order baseline labs today (in case of development of PIH can have on file).   Follow up in 4 weeks.  50% of 30 min visit spent on counseling and coordination of care.     Hildred Laser, MD [facility]

## 2019-03-08 NOTE — Patient Instructions (Signed)
Second Trimester of Pregnancy  The second trimester is from week 14 through week 27 (month 4 through 6). This is often the time in pregnancy that you feel your best. Often times, morning sickness has lessened or quit. You may have more energy, and you may get hungry more often. Your unborn baby is growing rapidly. At the end of the sixth month, he or she is about 9 inches long and weighs about 1 pounds. You will likely feel the baby move between 18 and 20 weeks of pregnancy. Follow these instructions at home: Medicines  Take over-the-counter and prescription medicines only as told by your doctor. Some medicines are safe and some medicines are not safe during pregnancy.  Take a prenatal vitamin that contains at least 600 micrograms (mcg) of folic acid.  If you have trouble pooping (constipation), take medicine that will make your stool soft (stool softener) if your doctor approves. Eating and drinking   Eat regular, healthy meals.  Avoid raw meat and uncooked cheese.  If you get low calcium from the food you eat, talk to your doctor about taking a daily calcium supplement.  Avoid foods that are high in fat and sugars, such as fried and sweet foods.  If you feel sick to your stomach (nauseous) or throw up (vomit): ? Eat 4 or 5 small meals a day instead of 3 large meals. ? Try eating a few soda crackers. ? Drink liquids between meals instead of during meals.  To prevent constipation: ? Eat foods that are high in fiber, like fresh fruits and vegetables, whole grains, and beans. ? Drink enough fluids to keep your pee (urine) clear or pale yellow. Activity  Exercise only as told by your doctor. Stop exercising if you start to have cramps.  Do not exercise if it is too hot, too humid, or if you are in a place of great height (high altitude).  Avoid heavy lifting.  Wear low-heeled shoes. Sit and stand up straight.  You can continue to have sex unless your doctor tells you not  to. Relieving pain and discomfort  Wear a good support bra if your breasts are tender.  Take warm water baths (sitz baths) to soothe pain or discomfort caused by hemorrhoids. Use hemorrhoid cream if your doctor approves.  Rest with your legs raised if you have leg cramps or low back pain.  If you develop puffy, bulging veins (varicose veins) in your legs: ? Wear support hose or compression stockings as told by your doctor. ? Raise (elevate) your feet for 15 minutes, 3-4 times a day. ? Limit salt in your food. Prenatal care  Write down your questions. Take them to your prenatal visits.  Keep all your prenatal visits as told by your doctor. This is important. Safety  Wear your seat belt when driving.  Make a list of emergency phone numbers, including numbers for family, friends, the hospital, and police and fire departments. General instructions  Ask your doctor about the right foods to eat or for help finding a counselor, if you need these services.  Ask your doctor about local prenatal classes. Begin classes before month 6 of your pregnancy.  Do not use hot tubs, steam rooms, or saunas.  Do not douche or use tampons or scented sanitary pads.  Do not cross your legs for long periods of time.  Visit your dentist if you have not done so. Use a soft toothbrush to brush your teeth. Floss gently.  Avoid all smoking, herbs,   and alcohol. Avoid drugs that are not approved by your doctor.  Do not use any products that contain nicotine or tobacco, such as cigarettes and e-cigarettes. If you need help quitting, ask your doctor.  Avoid cat litter boxes and soil used by cats. These carry germs that can cause birth defects in the baby and can cause a loss of your baby (miscarriage) or stillbirth. Contact a doctor if:  You have mild cramps or pressure in your lower belly.  You have pain when you pee (urinate).  You have bad smelling fluid coming from your vagina.  You continue to  feel sick to your stomach (nauseous), throw up (vomit), or have watery poop (diarrhea).  You have a nagging pain in your belly area.  You feel dizzy. Get help right away if:  You have a fever.  You are leaking fluid from your vagina.  You have spotting or bleeding from your vagina.  You have severe belly cramping or pain.  You lose or gain weight rapidly.  You have trouble catching your breath and have chest pain.  You notice sudden or extreme puffiness (swelling) of your face, hands, ankles, feet, or legs.  You have not felt the baby move in over an hour.  You have severe headaches that do not go away when you take medicine.  You have trouble seeing. Summary  The second trimester is from week 14 through week 27 (months 4 through 6). This is often the time in pregnancy that you feel your best.  To take care of yourself and your unborn baby, you will need to eat healthy meals, take medicines only if your doctor tells you to do so, and do activities that are safe for you and your baby.  Call your doctor if you get sick or if you notice anything unusual about your pregnancy. Also, call your doctor if you need help with the right food to eat, or if you want to know what activities are safe for you. This information is not intended to replace advice given to you by your health care provider. Make sure you discuss any questions you have with your health care provider. Document Revised: 04/29/2018 Document Reviewed: 02/11/2016 Elsevier Patient Education  [place]. Common Medications Safe in Pregnancy  Acne:      Constipation:  Benzoyl Peroxide     Colace  Clindamycin      Dulcolax Suppository  Topica Erythromycin     Fibercon  Salicylic Acid      Metamucil         Miralax AVOID:        Senakot   Accutane    Cough:  Retin-A       Cough Drops  Tetracycline      Phenergan w/ Codeine if Rx  Minocycline      Robitussin (Plain &  DM)  Antibiotics:     Crabs/Lice:  Ceclor       RID  Cephalosporins    AVOID:  E-Mycins      Kwell  Keflex  Macrobid/Macrodantin   Diarrhea:  Penicillin      Kao-Pectate  Zithromax      Imodium AD         PUSH FLUIDS AVOID:       Cipro     Fever:  Tetracycline      Tylenol (Regular or Extra  Minocycline       Strength)  Levaquin      Extra Strength-Do not  Exceed 8 tabs/24 hrs Caffeine:        <235m/day (equiv. To 1 cup of coffee or  approx. 3 12 oz sodas)         Gas: Cold/Hayfever:       Gas-X  Benadryl      Mylicon  Claritin       Phazyme  **Claritin-D        Chlor-Trimeton    Headaches:  Dimetapp      ASA-Free Excedrin  Drixoral-Non-Drowsy     Cold Compress  Mucinex (Guaifenasin)     Tylenol (Regular or Extra  Sudafed/Sudafed-12 Hour     Strength)  **Sudafed PE Pseudoephedrine   Tylenol Cold & Sinus     Vicks Vapor Rub  Zyrtec  **AVOID if Problems With Blood Pressure         Heartburn: Avoid lying down for at least 1 hour after meals  Aciphex      Maalox     Rash:  Milk of Magnesia     Benadryl    Mylanta       1% Hydrocortisone Cream  Pepcid  Pepcid Complete   Sleep Aids:  Prevacid      Ambien   Prilosec       Benadryl  Rolaids       Chamomile Tea  Tums (Limit 4/day)     Unisom  Zantac       Tylenol PM         Warm milk-add vanilla or  Hemorrhoids:       Sugar for taste  Anusol/Anusol H.C.  (RX: Analapram 2.5%)  Sugar Substitutes:  Hydrocortisone OTC     Ok in moderation  Preparation H      Tucks        Vaseline lotion applied to tissue with wiping    Herpes:     Throat:  Acyclovir      Oragel  Famvir  Valtrex     Vaccines:         Flu Shot Leg Cramps:       *Gardasil  Benadryl      Hepatitis A         Hepatitis B Nasal Spray:       Pneumovax  Saline Nasal Spray     Polio Booster         Tetanus Nausea:       Tuberculosis test or PPD  Vitamin B6 25 mg TID   AVOID:    Dramamine      *Gardasil  Emetrol       Live  Poliovirus  Ginger Root 250 mg QID    MMR (measles, mumps &  High Complex Carbs @ Bedtime    rebella)  Sea Bands-Accupressure    Varicella (Chickenpox)  Unisom 1/2 tab TID     *No known complications           If received before Pain:         Known pregnancy;   Darvocet       Resume series after  Lortab        Delivery  Percocet    Yeast:   Tramadol      Femstat  Tylenol 3      Gyne-lotrimin  Ultram       Monistat  Vicodin           MISC:         All Sunscreens  Hair Coloring/highlights          Insect Repellant's          (Including DEET)         Mystic Tans  

## 2019-03-08 NOTE — Progress Notes (Signed)
NOB-PE present today for routine prenatal care. Pt stated that she was doing well other than have round ligament pains. Genetic testing Natera completed today. PHQ-9=15.

## 2019-03-08 NOTE — Progress Notes (Unsigned)
Pt present today for NOB PE. Pt is currently [redacted]w[redacted]d. Pt desires genetic testing. Natera completed today.  Pt stated that she is doing well no problems.

## 2019-03-08 NOTE — Patient Instructions (Signed)
Second Trimester of Pregnancy The second trimester is from week 14 through week 27 (months 4 through 6). The second trimester is often a time when you feel your best. Your body has adjusted to being pregnant, and you begin to feel better physically. Usually, morning sickness has lessened or quit completely, you may have more energy, and you may have an increase in appetite. The second trimester is also a time when the fetus is growing rapidly. At the end of the sixth month, the fetus is about 9 inches long and weighs about 1 pounds. You will likely begin to feel the baby move (quickening) between 16 and 20 weeks of pregnancy. Body changes during your second trimester Your body continues to go through many changes during your second trimester. The changes vary from woman to woman.  Your weight will continue to increase. You will notice your lower abdomen bulging out.  You may begin to get stretch marks on your hips, abdomen, and breasts.  You may develop headaches that can be relieved by medicines. The medicines should be approved by your health care provider.  You may urinate more often because the fetus is pressing on your bladder.  You may develop or continue to have heartburn as a result of your pregnancy.  You may develop constipation because certain hormones are causing the muscles that push waste through your intestines to slow down.  You may develop hemorrhoids or swollen, bulging veins (varicose veins).  You may have back pain. This is caused by: ? Weight gain. ? Pregnancy hormones that are relaxing the joints in your pelvis. ? A shift in weight and the muscles that support your balance.  Your breasts will continue to grow and they will continue to become tender.  Your gums may bleed and may be sensitive to brushing and flossing.  Dark spots or blotches (chloasma, mask of pregnancy) may develop on your face. This will likely fade after the baby is born.  A dark line from your  belly button to the pubic area (linea nigra) may appear. This will likely fade after the baby is born.  You may have changes in your hair. These can include thickening of your hair, rapid growth, and changes in texture. Some women also have hair loss during or after pregnancy, or hair that feels dry or thin. Your hair will most likely return to normal after your baby is born. What to expect at prenatal visits During a routine prenatal visit:  You will be weighed to make sure you and the fetus are growing normally.  Your blood pressure will be taken.  Your abdomen will be measured to track your baby's growth.  The fetal heartbeat will be listened to.  Any test results from the previous visit will be discussed. Your health care provider may ask you:  How you are feeling.  If you are feeling the baby move.  If you have had any abnormal symptoms, such as leaking fluid, bleeding, severe headaches, or abdominal cramping.  If you are using any tobacco products, including cigarettes, chewing tobacco, and electronic cigarettes.  If you have any questions. Other tests that may be performed during your second trimester include:  Blood tests that check for: ? Low iron levels (anemia). ? High blood sugar that affects pregnant women (gestational diabetes) between 24 and 28 weeks. ? Rh antibodies. This is to check for a protein on red blood cells (Rh factor).  Urine tests to check for infections, diabetes, or protein in the   urine.  An ultrasound to confirm the proper growth and development of the baby.  An amniocentesis to check for possible genetic problems.  Fetal screens for spina bifida and Down syndrome.  HIV (human immunodeficiency virus) testing. Routine prenatal testing includes screening for HIV, unless you choose not to have this test. Follow these instructions at home: Medicines  Follow your health care provider's instructions regarding medicine use. Specific medicines may be  either safe or unsafe to take during pregnancy.  Take a prenatal vitamin that contains at least 600 micrograms (mcg) of folic acid.  If you develop constipation, try taking a stool softener if your health care provider approves. Eating and drinking   Eat a balanced diet that includes fresh fruits and vegetables, whole grains, good sources of protein such as meat, eggs, or tofu, and low-fat dairy. Your health care provider will help you determine the amount of weight gain that is right for you.  Avoid raw meat and uncooked cheese. These carry germs that can cause birth defects in the baby.  If you have low calcium intake from food, talk to your health care provider about whether you should take a daily calcium supplement.  Limit foods that are high in fat and processed sugars, such as fried and sweet foods.  To prevent constipation: ? Drink enough fluid to keep your urine clear or pale yellow. ? Eat foods that are high in fiber, such as fresh fruits and vegetables, whole grains, and beans. Activity  Exercise only as directed by your health care provider. Most women can continue their usual exercise routine during pregnancy. Try to exercise for 30 minutes at least 5 days a week. Stop exercising if you experience uterine contractions.  Avoid heavy lifting, wear low heel shoes, and practice good posture.  A sexual relationship may be continued unless your health care provider directs you otherwise. Relieving pain and discomfort  Wear a good support bra to prevent discomfort from breast tenderness.  Take warm sitz baths to soothe any pain or discomfort caused by hemorrhoids. Use hemorrhoid cream if your health care provider approves.  Rest with your legs elevated if you have leg cramps or low back pain.  If you develop varicose veins, wear support hose. Elevate your feet for 15 minutes, 3-4 times a day. Limit salt in your diet. Prenatal Care  Write down your questions. Take them to  your prenatal visits.  Keep all your prenatal visits as told by your health care provider. This is important. Safety  Wear your seat belt at all times when driving.  Make a list of emergency phone numbers, including numbers for family, friends, the hospital, and police and fire departments. General instructions  Ask your health care provider for a referral to a local prenatal education class. Begin classes no later than the beginning of month 6 of your pregnancy.  Ask for help if you have counseling or nutritional needs during pregnancy. Your health care provider can offer advice or refer you to specialists for help with various needs.  Do not use hot tubs, steam rooms, or saunas.  Do not douche or use tampons or scented sanitary pads.  Do not cross your legs for long periods of time.  Avoid cat litter boxes and soil used by cats. These carry germs that can cause birth defects in the baby and possibly loss of the fetus by miscarriage or stillbirth.  Avoid all smoking, herbs, alcohol, and unprescribed drugs. Chemicals in these products can affect the formation   and growth of the baby.  Do not use any products that contain nicotine or tobacco, such as cigarettes and e-cigarettes. If you need help quitting, ask your health care provider.  Visit your dentist if you have not gone yet during your pregnancy. Use a soft toothbrush to brush your teeth and be gentle when you floss. Contact a health care provider if:  You have dizziness.  You have mild pelvic cramps, pelvic pressure, or nagging pain in the abdominal area.  You have persistent nausea, vomiting, or diarrhea.  You have a bad smelling vaginal discharge.  You have pain when you urinate. Get help right away if:  You have a fever.  You are leaking fluid from your vagina.  You have spotting or bleeding from your vagina.  You have severe abdominal cramping or pain.  You have rapid weight gain or weight loss.  You have  shortness of breath with chest pain.  You notice sudden or extreme swelling of your face, hands, ankles, feet, or legs.  You have not felt your baby move in over an hour.  You have severe headaches that do not go away when you take medicine.  You have vision changes. Summary  The second trimester is from week 14 through week 27 (months 4 through 6). It is also a time when the fetus is growing rapidly.  Your body goes through many changes during pregnancy. The changes vary from woman to woman.  Avoid all smoking, herbs, alcohol, and unprescribed drugs. These chemicals affect the formation and growth your baby.  Do not use any tobacco products, such as cigarettes, chewing tobacco, and e-cigarettes. If you need help quitting, ask your health care provider.  Contact your health care provider if you have any questions. Keep all prenatal visits as told by your health care provider. This is important. This information is not intended to replace advice given to you by your health care provider. Make sure you discuss any questions you have with your health care provider. Document Revised: 04/29/2018 Document Reviewed: 02/11/2016 Elsevier Patient Education  2020 [employer]   Perinatal Depression When a woman feels excessive sadness, anger, or anxiety during pregnancy or during the first 12 months after she gives birth, she has a condition called perinatal depression. Depression can interfere with work, school, relationships, and other everyday activities. If it is not managed properly, it can also cause problems in the mother and her baby. Sometimes, perinatal depression is left untreated because symptoms are thought to be normal mood swings during and right after pregnancy. If you have symptoms of depression, it is important to talk with your health care provider. What are the causes? The exact cause of this condition is not known. Hormonal changes during and after pregnancy may play a role  in causing perinatal depression. What increases the risk? You are more likely to develop this condition if:  You have a personal or family history of depression, anxiety, or mood disorders.  You experience a stressful life event during pregnancy, such as the death of a loved one.  You have a lot of regular life stress.  You do not have support from family members or loved ones, or you are in an abusive relationship. What are the signs or symptoms? Symptoms of this condition include:  Feeling sad or hopeless.  Feelings of guilt.  Feeling irritable or overwhelmed.  Changes in your appetite.  Lack of energy or motivation.  Sleep problems.  Difficulty concentrating or completing tasks.  Loss of interest in hobbies or relationships.  Headaches or stomach problems that do not go away. How is this diagnosed? This condition is diagnosed based on a physical exam and mental evaluation. In some cases, your health care provider may use a depression screening tool. These tools include a list of questions that can help a health care provider diagnose depression. Your health care provider may refer you to a mental health expert who specializes in depression. How is this treated? This condition may be treated with:  Medicines. Your health care provider will only give you medicines that have been proven safe for pregnancy and breastfeeding.  Talk therapy with a mental health professional to help change your patterns of thinking (cognitive behavioral therapy).  Support groups.  Brain stimulation or light therapies.  Stress reduction therapies, such as mindfulness. Follow these instructions at home: Lifestyle  Do not use any products that contain nicotine or tobacco, such as cigarettes and e-cigarettes. If you need help quitting, ask your health care provider.  Do not use alcohol when you are pregnant. After your baby is born, limit alcohol intake to no more than 1 drink a day. One  drink equals 12 oz of beer, 5 oz of wine, or 1 oz of hard liquor.  Consider joining a support group for new mothers. Ask your health care provider for recommendations.  Take good care of yourself. Make sure you: ? Get plenty of sleep. If you are having trouble sleeping, talk with your health care provider. ? Eat a healthy diet. This includes plenty of fruits and vegetables, whole grains, and lean proteins. ? Exercise regularly, as told by your health care provider. Ask your health care provider what exercises are safe for you. General instructions  Take over-the-counter and prescription medicines only as told by your health care provider.  Talk with your partner or family members about your feelings during pregnancy. Share any concerns or anxieties that you may have.  Ask for help with tasks or chores when you need it. Ask friends and family members to provide meals, watch your children, or help with cleaning.  Keep all follow-up visits as told by your health care provider. This is important. Contact a health care provider if:  You (or people close to you) notice that you have any symptoms of depression.  You have depression and your symptoms get worse.  You experience side effects from medicines, such as nausea or sleep problems. Get help right away if:  You feel like hurting yourself, your baby, or someone else. If you ever feel like you may hurt yourself or others, or have thoughts about taking your own life, get help right away. You can go to your nearest emergency department or call:  Your local emergency services (911 in the [place]  A suicide crisis helpline, such as the National Suicide Prevention Lifeline at 438-454-1977. This is open 24 hours a day. Summary  Perinatal depression is when a woman feels excessive sadness, anger, or anxiety during pregnancy or during the first 12 months after she gives birth.  If perinatal depression is not treated, it can lead to health  problems for the mother and her baby.  This condition is treated with medicines, talk therapy, stress reduction therapies, or a combination of two or more treatments.  Talk with your partner or family members about your feelings. Do not be afraid to ask for help. This information is not intended to replace advice given to you by your health care provider.  Make sure you discuss any questions you have with your health care provider. Document Revised: 06/22/2018 Document Reviewed: 03/04/2016 Elsevier Patient Education  [place].

## 2019-03-08 NOTE — Addendum Note (Signed)
Addended by: Fabian November on: 03/08/2019 04:15 PM   Modules accepted: Orders

## 2019-03-09 LAB — HEPATIC FUNCTION PANEL
ALT: 34 IU/L — ABNORMAL HIGH (ref 0–32)
AST: 15 IU/L (ref 0–40)
Albumin: 4.1 g/dL (ref 3.9–5.0)
Alkaline Phosphatase: 109 IU/L (ref 39–117)
Bilirubin Total: <0.2 mg/dL (ref 0.0–1.2)
Bilirubin, Direct: 0.08 mg/dL (ref 0.00–0.40)
Total Protein: 6.2 g/dL (ref 6.0–8.5)

## 2019-04-05 ENCOUNTER — Other Ambulatory Visit: Payer: Self-pay

## 2019-04-05 ENCOUNTER — Encounter: Payer: Self-pay | Admitting: Obstetrics and Gynecology

## 2019-04-05 ENCOUNTER — Ambulatory Visit (INDEPENDENT_AMBULATORY_CARE_PROVIDER_SITE_OTHER): Payer: BC Managed Care – PPO | Admitting: Obstetrics and Gynecology

## 2019-04-05 VITALS — BP 108/65 | HR 89 | Wt 148.3 lb

## 2019-04-05 DIAGNOSIS — Z3A17 17 weeks gestation of pregnancy: Secondary | ICD-10-CM

## 2019-04-05 DIAGNOSIS — Z3402 Encounter for supervision of normal first pregnancy, second trimester: Secondary | ICD-10-CM

## 2019-04-05 LAB — POCT URINALYSIS DIPSTICK OB
Bilirubin, UA: NEGATIVE
Blood, UA: NEGATIVE
Glucose, UA: NEGATIVE
Ketones, UA: NEGATIVE
Leukocytes, UA: NEGATIVE
Nitrite, UA: NEGATIVE
POC,PROTEIN,UA: NEGATIVE
Spec Grav, UA: 1.01 (ref 1.010–1.025)
Urobilinogen, UA: 0.2 E.U./dL
pH, UA: 7 (ref 5.0–8.0)

## 2019-04-05 NOTE — Progress Notes (Signed)
ROB: No problems.  Patient desires AFP today.  FAS next visit.  Still waiting on panorama results.

## 2019-04-07 LAB — AFP, SERUM, OPEN SPINA BIFIDA
AFP MoM: 1.15
AFP Value: 38.6 ng/mL
Gest. Age on Collection Date: 16 weeks
Maternal Age At EDD: 25.8 yr
OSBR Risk 1 IN: 7603
Test Results:: NEGATIVE
Weight: 148 [lb_av]

## 2019-04-17 ENCOUNTER — Ambulatory Visit: Payer: BC Managed Care – PPO | Admitting: Psychology

## 2019-04-24 ENCOUNTER — Ambulatory Visit: Payer: BC Managed Care – PPO | Admitting: Psychology

## 2019-05-01 ENCOUNTER — Ambulatory Visit: Payer: BC Managed Care – PPO | Admitting: Psychology

## 2019-05-03 ENCOUNTER — Ambulatory Visit (INDEPENDENT_AMBULATORY_CARE_PROVIDER_SITE_OTHER): Payer: BC Managed Care – PPO | Admitting: Obstetrics and Gynecology

## 2019-05-03 ENCOUNTER — Other Ambulatory Visit: Payer: Self-pay

## 2019-05-03 ENCOUNTER — Ambulatory Visit (INDEPENDENT_AMBULATORY_CARE_PROVIDER_SITE_OTHER): Payer: BC Managed Care – PPO

## 2019-05-03 ENCOUNTER — Encounter: Payer: Self-pay | Admitting: Obstetrics and Gynecology

## 2019-05-03 VITALS — BP 102/68 | HR 103 | Wt 153.9 lb

## 2019-05-03 DIAGNOSIS — Z3402 Encounter for supervision of normal first pregnancy, second trimester: Secondary | ICD-10-CM

## 2019-05-03 DIAGNOSIS — O99342 Other mental disorders complicating pregnancy, second trimester: Secondary | ICD-10-CM

## 2019-05-03 DIAGNOSIS — Z3482 Encounter for supervision of other normal pregnancy, second trimester: Secondary | ICD-10-CM

## 2019-05-03 DIAGNOSIS — Z3A20 20 weeks gestation of pregnancy: Secondary | ICD-10-CM

## 2019-05-03 DIAGNOSIS — F329 Major depressive disorder, single episode, unspecified: Secondary | ICD-10-CM

## 2019-05-03 LAB — POCT URINALYSIS DIPSTICK OB
Bilirubin, UA: NEGATIVE
Blood, UA: NEGATIVE
Glucose, UA: NEGATIVE
Ketones, UA: NEGATIVE
Leukocytes, UA: NEGATIVE
Nitrite, UA: NEGATIVE
POC,PROTEIN,UA: NEGATIVE
Spec Grav, UA: 1.01 (ref 1.010–1.025)
Urobilinogen, UA: 0.2 E.U./dL
pH, UA: 7.5 (ref 5.0–8.0)

## 2019-05-03 NOTE — Progress Notes (Signed)
ROB: Patient doing well, no issues.  S/p normal anatomy scan today. Discussed breastfeeding (patient with prior h/o breast reduction, but notes it went well after last pregnancy). Plans for epidural. Discussed breastfeeding. Is compliant with Celexa.  RTC in 4 weeks.     The following were addressed during this visit:  Breastfeeding Education - Early initiation of breastfeeding    Comments: Keeps milk supply adequate, helps contract uterus and slow bleeding, and early milk is the perfect first food and is easy to digest.   - The importance of exclusive breastfeeding    Comments: Provides antibodies, Lower risk of breast and ovarian cancers, and type-2 diabetes,Helps your body recover, Reduced chance of SIDS.   - Risks of giving your baby anything other than breast milk if you are breastfeeding    Comments: Make the baby less content with breastfeeds, may make my baby more susceptible to illness, and may reduce my milk supply.

## 2019-05-03 NOTE — Patient Instructions (Signed)
Second Trimester of Pregnancy The second trimester is from week 14 through week 27 (months 4 through 6). The second trimester is often a time when you feel your best. Your body has adjusted to being pregnant, and you begin to feel better physically. Usually, morning sickness has lessened or quit completely, you may have more energy, and you may have an increase in appetite. The second trimester is also a time when the fetus is growing rapidly. At the end of the sixth month, the fetus is about 9 inches long and weighs about 1 pounds. You will likely begin to feel the baby move (quickening) between 16 and 20 weeks of pregnancy. Body changes during your second trimester Your body continues to go through many changes during your second trimester. The changes vary from woman to woman.  Your weight will continue to increase. You will notice your lower abdomen bulging out.  You may begin to get stretch marks on your hips, abdomen, and breasts.  You may develop headaches that can be relieved by medicines. The medicines should be approved by your health care provider.  You may urinate more often because the fetus is pressing on your bladder.  You may develop or continue to have heartburn as a result of your pregnancy.  You may develop constipation because certain hormones are causing the muscles that push waste through your intestines to slow down.  You may develop hemorrhoids or swollen, bulging veins (varicose veins).  You may have back pain. This is caused by: ? Weight gain. ? Pregnancy hormones that are relaxing the joints in your pelvis. ? A shift in weight and the muscles that support your balance.  Your breasts will continue to grow and they will continue to become tender.  Your gums may bleed and may be sensitive to brushing and flossing.  Dark spots or blotches (chloasma, mask of pregnancy) may develop on your face. This will likely fade after the baby is born.  A dark line from your  belly button to the pubic area (linea nigra) may appear. This will likely fade after the baby is born.  You may have changes in your hair. These can include thickening of your hair, rapid growth, and changes in texture. Some women also have hair loss during or after pregnancy, or hair that feels dry or thin. Your hair will most likely return to normal after your baby is born. What to expect at prenatal visits During a routine prenatal visit:  You will be weighed to make sure you and the fetus are growing normally.  Your blood pressure will be taken.  Your abdomen will be measured to track your baby's growth.  The fetal heartbeat will be listened to.  Any test results from the previous visit will be discussed. Your health care provider may ask you:  How you are feeling.  If you are feeling the baby move.  If you have had any abnormal symptoms, such as leaking fluid, bleeding, severe headaches, or abdominal cramping.  If you are using any tobacco products, including cigarettes, chewing tobacco, and electronic cigarettes.  If you have any questions. Other tests that may be performed during your second trimester include:  Blood tests that check for: ? Low iron levels (anemia). ? High blood sugar that affects pregnant women (gestational diabetes) between 24 and 28 weeks. ? Rh antibodies. This is to check for a protein on red blood cells (Rh factor).  Urine tests to check for infections, diabetes, or protein in the   urine.  An ultrasound to confirm the proper growth and development of the baby.  An amniocentesis to check for possible genetic problems.  Fetal screens for spina bifida and Down syndrome.  HIV (human immunodeficiency virus) testing. Routine prenatal testing includes screening for HIV, unless you choose not to have this test. Follow these instructions at home: Medicines  Follow your health care provider's instructions regarding medicine use. Specific medicines may be  either safe or unsafe to take during pregnancy.  Take a prenatal vitamin that contains at least 600 micrograms (mcg) of folic acid.  If you develop constipation, try taking a stool softener if your health care provider approves. Eating and drinking   Eat a balanced diet that includes fresh fruits and vegetables, whole grains, good sources of protein such as meat, eggs, or tofu, and low-fat dairy. Your health care provider will help you determine the amount of weight gain that is right for you.  Avoid raw meat and uncooked cheese. These carry germs that can cause birth defects in the baby.  If you have low calcium intake from food, talk to your health care provider about whether you should take a daily calcium supplement.  Limit foods that are high in fat and processed sugars, such as fried and sweet foods.  To prevent constipation: ? Drink enough fluid to keep your urine clear or pale yellow. ? Eat foods that are high in fiber, such as fresh fruits and vegetables, whole grains, and beans. Activity  Exercise only as directed by your health care provider. Most women can continue their usual exercise routine during pregnancy. Try to exercise for 30 minutes at least 5 days a week. Stop exercising if you experience uterine contractions.  Avoid heavy lifting, wear low heel shoes, and practice good posture.  A sexual relationship may be continued unless your health care provider directs you otherwise. Relieving pain and discomfort  Wear a good support bra to prevent discomfort from breast tenderness.  Take warm sitz baths to soothe any pain or discomfort caused by hemorrhoids. Use hemorrhoid cream if your health care provider approves.  Rest with your legs elevated if you have leg cramps or low back pain.  If you develop varicose veins, wear support hose. Elevate your feet for 15 minutes, 3-4 times a day. Limit salt in your diet. Prenatal Care  Write down your questions. Take them to  your prenatal visits.  Keep all your prenatal visits as told by your health care provider. This is important. Safety  Wear your seat belt at all times when driving.  Make a list of emergency phone numbers, including numbers for family, friends, the hospital, and police and fire departments. General instructions  Ask your health care provider for a referral to a local prenatal education class. Begin classes no later than the beginning of month 6 of your pregnancy.  Ask for help if you have counseling or nutritional needs during pregnancy. Your health care provider can offer advice or refer you to specialists for help with various needs.  Do not use hot tubs, steam rooms, or saunas.  Do not douche or use tampons or scented sanitary pads.  Do not cross your legs for long periods of time.  Avoid cat litter boxes and soil used by cats. These carry germs that can cause birth defects in the baby and possibly loss of the fetus by miscarriage or stillbirth.  Avoid all smoking, herbs, alcohol, and unprescribed drugs. Chemicals in these products can affect the formation   formation and growth of the baby.  Do not use any products that contain nicotine or tobacco, such as cigarettes and e-cigarettes. If you need help quitting, ask your health care provider.  Visit your dentist if you have not gone yet during your pregnancy. Use a soft toothbrush to brush your teeth and be gentle when you floss. Contact a health care provider if:  You have dizziness.  You have mild pelvic cramps, pelvic pressure, or nagging pain in the abdominal area.  You have persistent nausea, vomiting, or diarrhea.  You have a bad smelling vaginal discharge.  You have pain when you urinate. Get help right away if:  You have a fever.  You are leaking fluid from your vagina.  You have spotting or bleeding from your vagina.  You have severe abdominal cramping or pain.  You have rapid weight gain or weight loss.  You  have shortness of breath with chest pain.  You notice sudden or extreme swelling of your face, hands, ankles, feet, or legs.  You have not felt your baby move in over an hour.  You have severe headaches that do not go away when you take medicine.  You have vision changes. Summary  The second trimester is from week 14 through week 27 (months 4 through 6). It is also a time when the fetus is growing rapidly.  Your body goes through many changes during pregnancy. The changes vary from woman to woman.  Avoid all smoking, herbs, alcohol, and unprescribed drugs. These chemicals affect the formation and growth your baby.  Do not use any tobacco products, such as cigarettes, chewing tobacco, and e-cigarettes. If you need help quitting, ask your health care provider.  Contact your health care provider if you have any questions. Keep all prenatal visits as told by your health care provider. This is important. This information is not intended to replace advice given to you by your health care provider. Make sure you discuss any questions you have with your health care provider. Document Revised: 04/29/2018 Document Reviewed: 02/11/2016 [employer] Patient Education  2020 [employer]   Breastfeeding  Choosing to breastfeed is one of the best decisions you can make for yourself and your baby. A change in hormones during pregnancy causes your breasts to make breast milk in your milk-producing glands. Hormones prevent breast milk from being released before your baby is born. They also prompt milk flow after birth. Once breastfeeding has begun, thoughts of your baby, as well as his or her sucking or crying, can stimulate the release of milk from your milk-producing glands. Benefits of breastfeeding Research shows that breastfeeding offers many health benefits for infants and mothers. It also offers a cost-free and convenient way to feed your baby. For your baby  Your first milk (colostrum) helps your  baby's digestive system to function better.  Special cells in your milk (antibodies) help your baby to fight off infections.  Breastfed babies are less likely to develop asthma, allergies, obesity, or type 2 diabetes. They are also at lower risk for sudden infant death syndrome (SIDS).  Nutrients in breast milk are better able to meet your baby's needs compared to infant formula.  Breast milk improves your baby's brain development. For you  Breastfeeding helps to create a very special bond between you and your baby.  Breastfeeding is convenient. Breast milk costs nothing and is always available at the correct temperature.  Breastfeeding helps to burn calories. It helps you to lose the weight that you   gained during pregnancy.  Breastfeeding makes your uterus return faster to its size before pregnancy. It also slows bleeding (lochia) after you give birth.  Breastfeeding helps to lower your risk of developing type 2 diabetes, osteoporosis, rheumatoid arthritis, cardiovascular disease, and breast, ovarian, uterine, and endometrial cancer later in life. Breastfeeding basics Starting breastfeeding  Find a comfortable place to sit or lie down, with your neck and back well-supported.  Place a pillow or a rolled-up blanket under your baby to bring him or her to the level of your breast (if you are seated). Nursing pillows are specially designed to help support your arms and your baby while you breastfeed.  Make sure that your baby's tummy (abdomen) is facing your abdomen.  Gently massage your breast. With your fingertips, massage from the outer edges of your breast inward toward the nipple. This encourages milk flow. If your milk flows slowly, you may need to continue this action during the feeding.  Support your breast with 4 fingers underneath and your thumb above your nipple (make the letter "C" with your hand). Make sure your fingers are well away from your nipple and your baby's  mouth.  Stroke your baby's lips gently with your finger or nipple.  When your baby's mouth is open wide enough, quickly bring your baby to your breast, placing your entire nipple and as much of the areola as possible into your baby's mouth. The areola is the colored area around your nipple. ? More areola should be visible above your baby's upper lip than below the lower lip. ? Your baby's lips should be opened and extended outward (flanged) to ensure an adequate, comfortable latch. ? Your baby's tongue should be between his or her lower gum and your breast.  Make sure that your baby's mouth is correctly positioned around your nipple (latched). Your baby's lips should create a seal on your breast and be turned out (everted).  It is common for your baby to suck about 2-3 minutes in order to start the flow of breast milk. Latching Teaching your baby how to latch onto your breast properly is very important. An improper latch can cause nipple pain, decreased milk supply, and poor weight gain in your baby. Also, if your baby is not latched onto your nipple properly, he or she may swallow some air during feeding. This can make your baby fussy. Burping your baby when you switch breasts during the feeding can help to get rid of the air. However, teaching your baby to latch on properly is still the best way to prevent fussiness from swallowing air while breastfeeding. Signs that your baby has successfully latched onto your nipple  Silent tugging or silent sucking, without causing you pain. Infant's lips should be extended outward (flanged).  Swallowing heard between every 3-4 sucks once your milk has started to flow (after your let-down milk reflex occurs).  Muscle movement above and in front of his or her ears while sucking. Signs that your baby has not successfully latched onto your nipple  Sucking sounds or smacking sounds from your baby while breastfeeding.  Nipple pain. If you think your baby  has not latched on correctly, slip your finger into the corner of your baby's mouth to break the suction and place it between your baby's gums. Attempt to start breastfeeding again. Signs of successful breastfeeding Signs from your baby  Your baby will gradually decrease the number of sucks or will completely stop sucking.  Your baby will fall asleep.  Your  baby's body will relax.  Your baby will retain a small amount of milk in his or her mouth.  Your baby will let go of your breast by himself or herself. Signs from you  Breasts that have increased in firmness, weight, and size 1-3 hours after feeding.  Breasts that are softer immediately after breastfeeding.  Increased milk volume, as well as a change in milk consistency and color by the fifth day of breastfeeding.  Nipples that are not sore, cracked, or bleeding. Signs that your baby is getting enough milk  Wetting at least 1-2 diapers during the first 24 hours after birth.  Wetting at least 5-6 diapers every 24 hours for the first week after birth. The urine should be clear or pale yellow by the age of 5 days.  Wetting 6-8 diapers every 24 hours as your baby continues to grow and develop.  At least 3 stools in a 24-hour period by the age of 5 days. The stool should be soft and yellow.  At least 3 stools in a 24-hour period by the age of 7 days. The stool should be seedy and yellow.  No loss of weight greater than 10% of birth weight during the first 3 days of life.  Average weight gain of 4-7 oz (113-198 g) per week after the age of 4 days.  Consistent daily weight gain by the age of 5 days, without weight loss after the age of 2 weeks. After a feeding, your baby may spit up a small amount of milk. This is normal. Breastfeeding frequency and duration Frequent feeding will help you make more milk and can prevent sore nipples and extremely full breasts (breast engorgement). Breastfeed when you feel the need to reduce the  fullness of your breasts or when your baby shows signs of hunger. This is called "breastfeeding on demand." Signs that your baby is hungry include:  Increased alertness, activity, or restlessness.  Movement of the head from side to side.  Opening of the mouth when the corner of the mouth or cheek is stroked (rooting).  Increased sucking sounds, smacking lips, cooing, sighing, or squeaking.  Hand-to-mouth movements and sucking on fingers or hands.  Fussing or crying. Avoid introducing a pacifier to your baby in the first 4-6 weeks after your baby is born. After this time, you may choose to use a pacifier. Research has shown that pacifier use during the first year of a baby's life decreases the risk of sudden infant death syndrome (SIDS). Allow your baby to feed on each breast as long as he or she wants. When your baby unlatches or falls asleep while feeding from the first breast, offer the second breast. Because newborns are often sleepy in the first few weeks of life, you may need to awaken your baby to get him or her to feed. Breastfeeding times will vary from baby to baby. However, the following rules can serve as a guide to help you make sure that your baby is properly fed:  Newborns (babies 4 weeks of age or younger) may breastfeed every 1-3 hours.  Newborns should not go without breastfeeding for longer than 3 hours during the day or 5 hours during the night.  You should breastfeed your baby a minimum of 8 times in a 24-hour period. Breast milk pumping     Pumping and storing breast milk allows you to make sure that your baby is exclusively fed your breast milk, even at times when you are unable to breastfeed. This   is especially important if you go back to work while you are still breastfeeding, or if you are not able to be present during feedings. Your lactation consultant can help you find a method of pumping that works best for you and give you guidelines about how long it is safe  to store breast milk. Caring for your breasts while you breastfeed Nipples can become dry, cracked, and sore while breastfeeding. The following recommendations can help keep your breasts moisturized and healthy:  Avoid using soap on your nipples.  Wear a supportive bra designed especially for nursing. Avoid wearing underwire-style bras or extremely tight bras (sports bras).  Air-dry your nipples for 3-4 minutes after each feeding.  Use only cotton bra pads to absorb leaked breast milk. Leaking of breast milk between feedings is normal.  Use lanolin on your nipples after breastfeeding. Lanolin helps to maintain your skin's normal moisture barrier. Pure lanolin is not harmful (not toxic) to your baby. You may also hand express a few drops of breast milk and gently massage that milk into your nipples and allow the milk to air-dry. In the first few weeks after giving birth, some women experience breast engorgement. Engorgement can make your breasts feel heavy, warm, and tender to the touch. Engorgement peaks within 3-5 days after you give birth. The following recommendations can help to ease engorgement:  Completely empty your breasts while breastfeeding or pumping. You may want to start by applying warm, moist heat (in the shower or with warm, water-soaked hand towels) just before feeding or pumping. This increases circulation and helps the milk flow. If your baby does not completely empty your breasts while breastfeeding, pump any extra milk after he or she is finished.  Apply ice packs to your breasts immediately after breastfeeding or pumping, unless this is too uncomfortable for you. To do this: ? Put ice in a plastic bag. ? Place a towel between your skin and the bag. ? Leave the ice on for 20 minutes, 2-3 times a day.  Make sure that your baby is latched on and positioned properly while breastfeeding. If engorgement persists after 48 hours of following these recommendations, contact your  health care provider or a lactation consultant. Overall health care recommendations while breastfeeding  Eat 3 healthy meals and 3 snacks every day. Well-nourished mothers who are breastfeeding need an additional 450-500 calories a day. You can meet this requirement by increasing the amount of a balanced diet that you eat.  Drink enough water to keep your urine pale yellow or clear.  Rest often, relax, and continue to take your prenatal vitamins to prevent fatigue, stress, and low vitamin and mineral levels in your body (nutrient deficiencies).  Do not use any products that contain nicotine or tobacco, such as cigarettes and e-cigarettes. Your baby may be harmed by chemicals from cigarettes that pass into breast milk and exposure to secondhand smoke. If you need help quitting, ask your health care provider.  Avoid alcohol.  Do not use illegal drugs or marijuana.  Talk with your health care provider before taking any medicines. These include over-the-counter and prescription medicines as well as vitamins and herbal supplements. Some medicines that may be harmful to your baby can pass through breast milk.  It is possible to become pregnant while breastfeeding. If birth control is desired, ask your health care provider about options that will be safe while breastfeeding your baby. Where to find more information: [employer]: www.llli.org Contact a health care provider   if:  You feel like you want to stop breastfeeding or have become frustrated with breastfeeding.  Your nipples are cracked or bleeding.  Your breasts are red, tender, or warm.  You have: ? Painful breasts or nipples. ? A swollen area on either breast. ? A fever or chills. ? Nausea or vomiting. ? Drainage other than breast milk from your nipples.  Your breasts do not become full before feedings by the fifth day after you give birth.  You feel sad and depressed.  Your baby is: ? Too sleepy to eat  well. ? Having trouble sleeping. ? More than 1 week old and wetting fewer than 6 diapers in a 24-hour period. ? Not gaining weight by 5 days of age.  Your baby has fewer than 3 stools in a 24-hour period.  Your baby's skin or the white parts of his or her eyes become yellow. Get help right away if:  Your baby is overly tired (lethargic) and does not want to wake up and feed.  Your baby develops an unexplained fever. Summary  Breastfeeding offers many health benefits for infant and mothers.  Try to breastfeed your infant when he or she shows early signs of hunger.  Gently tickle or stroke your baby's lips with your finger or nipple to allow the baby to open his or her mouth. Bring the baby to your breast. Make sure that much of the areola is in your baby's mouth. Offer one side and burp the baby before you offer the other side.  Talk with your health care provider or lactation consultant if you have questions or you face problems as you breastfeed. This information is not intended to replace advice given to you by your health care provider. Make sure you discuss any questions you have with your health care provider. Document Revised: 04/01/2017 Document Reviewed: 02/07/2016 Elsevier Patient Education  2020 [employer]  

## 2019-05-03 NOTE — Progress Notes (Signed)
ROB-Pt present for routine prenatal care. Pt stated having lower abd pain and pressure. No other issues.

## 2019-06-01 ENCOUNTER — Encounter: Payer: Self-pay | Admitting: Obstetrics and Gynecology

## 2019-06-01 ENCOUNTER — Other Ambulatory Visit: Payer: Self-pay

## 2019-06-01 ENCOUNTER — Ambulatory Visit (INDEPENDENT_AMBULATORY_CARE_PROVIDER_SITE_OTHER): Payer: BC Managed Care – PPO | Admitting: Obstetrics and Gynecology

## 2019-06-01 VITALS — BP 93/60 | HR 103 | Wt 158.0 lb

## 2019-06-01 DIAGNOSIS — Z3482 Encounter for supervision of other normal pregnancy, second trimester: Secondary | ICD-10-CM

## 2019-06-01 DIAGNOSIS — Z3A24 24 weeks gestation of pregnancy: Secondary | ICD-10-CM

## 2019-06-01 NOTE — Progress Notes (Signed)
ROB: Patient has no complaints.  Taking vitamins daily.  Continues to take Celexa.  Reports daily fetal movement.  1 hour GCT next visit.

## 2019-06-28 NOTE — Patient Instructions (Signed)
https://www.cdc.gov/vaccines/hcp/vis/vis-statements/tdap.pdf">  Tdap (Tetanus, Diphtheria, Pertussis) Vaccine: What You Need to Know 1. Why get vaccinated? Tdap vaccine can prevent tetanus, diphtheria, and pertussis. Diphtheria and pertussis spread from person to person. Tetanus enters the body through cuts or wounds.  TETANUS (T) causes painful stiffening of the muscles. Tetanus can lead to serious health problems, including being unable to open the mouth, having trouble swallowing and breathing, or death.  DIPHTHERIA (D) can lead to difficulty breathing, heart failure, paralysis, or death.  PERTUSSIS (aP), also known as "whooping cough," can cause uncontrollable, violent coughing which makes it hard to breathe, eat, or drink. Pertussis can be extremely serious in babies and young children, causing pneumonia, convulsions, brain damage, or death. In teens and adults, it can cause weight loss, loss of bladder control, passing out, and rib fractures from severe coughing. 2. Tdap vaccine Tdap is only for children 7 years and older, adolescents, and adults.  Adolescents should receive a single dose of Tdap, preferably at age 53 or 35 years. Pregnant women should get a dose of Tdap during every pregnancy, to protect the newborn from pertussis. Infants are most at risk for severe, life-threatening complications from pertussis. Adults who have never received Tdap should get a dose of Tdap. Also, adults should receive a booster dose every 10 years, or earlier in the case of a severe and dirty wound or burn. Booster doses can be either Tdap or Td (a different vaccine that protects against tetanus and diphtheria but not pertussis). Tdap may be given at the same time as other vaccines. 3. Talk with your health care provider Tell your vaccine provider if the person getting the vaccine:  Has had an allergic reaction after a previous dose of any vaccine that protects against tetanus, diphtheria, or pertussis,  or has any severe, life-threatening allergies.  Has had a coma, decreased level of consciousness, or prolonged seizures within 7 days after a previous dose of any pertussis vaccine (DTP, DTaP, or Tdap).  Has seizures or another nervous system problem.  Has ever had Guillain-Barr Syndrome (also called GBS).  Has had severe pain or swelling after a previous dose of any vaccine that protects against tetanus or diphtheria. In some cases, your health care provider may decide to postpone Tdap vaccination to a future visit.  People with minor illnesses, such as a cold, may be vaccinated. People who are moderately or severely ill should usually wait until they recover before getting Tdap vaccine.  Your health care provider can give you more information. 4. Risks of a vaccine reaction  Pain, redness, or swelling where the shot was given, mild fever, headache, feeling tired, and nausea, vomiting, diarrhea, or stomachache sometimes happen after Tdap vaccine. People sometimes faint after medical procedures, including vaccination. Tell your provider if you feel dizzy or have vision changes or ringing in the ears.  As with any medicine, there is a very remote chance of a vaccine causing a severe allergic reaction, other serious injury, or death. 5. What if there is a serious problem? An allergic reaction could occur after the vaccinated person leaves the clinic. If you see signs of a severe allergic reaction (hives, swelling of the face and throat, difficulty breathing, a fast heartbeat, dizziness, or weakness), call 9-1-1 and get the person to the nearest hospital. For other signs that concern you, call your health care provider.  Adverse reactions should be reported to the Vaccine Adverse Event Reporting System (VAERS). Your health care provider will usually file this report,  or you can do it yourself. Visit the VAERS website at www.vaers.hhs.gov or call 1-800-822-7967. VAERS is only for reporting  reactions, and VAERS staff do not give medical advice. 6. The National Vaccine Injury Compensation Program The National Vaccine Injury Compensation Program (VICP) is a federal program that was created to compensate people who may have been injured by certain vaccines. Visit the VICP website at www.hrsa.gov/vaccinecompensation or call 1-800-338-2382 to learn about the program and about filing a claim. There is a time limit to file a claim for compensation. 7. How can I learn more?  Ask your health care provider.  Call your local or state health department.  Contact the Centers for Disease Control and Prevention (CDC): ? Call 1-800-232-4636 (1-800-CDC-INFO) or ? Visit CDC's website at www.cdc.gov/vaccines Vaccine Information Statement Tdap (Tetanus, Diphtheria, Pertussis) Vaccine (04/20/2018) This information is not intended to replace advice given to you by your health care provider. Make sure you discuss any questions you have with your health care provider. Document Revised: 04/29/2018 Document Reviewed: 05/02/2018 Elsevier Patient Education  2020 [employer] Third Trimester of Pregnancy  The third trimester is from week 28 through week 40 (months 7 through 9). This trimester is when your unborn baby (fetus) is growing very fast. At the end of the ninth month, the unborn baby is about 20 inches in length. It weighs about 6-10 pounds. Follow these instructions at home: Medicines  Take over-the-counter and prescription medicines only as told by your doctor. Some medicines are safe and some medicines are not safe during pregnancy.  Take a prenatal vitamin that contains at least 600 micrograms (mcg) of folic acid.  If you have trouble pooping (constipation), take medicine that will make your stool soft (stool softener) if your doctor approves. Eating and drinking   Eat regular, healthy meals.  Avoid raw meat and uncooked cheese.  If you get low calcium from the food you eat, talk to  your doctor about taking a daily calcium supplement.  Eat four or five small meals rather than three large meals a day.  Avoid foods that are high in fat and sugars, such as fried and sweet foods.  To prevent constipation: ? Eat foods that are high in fiber, like fresh fruits and vegetables, whole grains, and beans. ? Drink enough fluids to keep your pee (urine) clear or pale yellow. Activity  Exercise only as told by your doctor. Stop exercising if you start to have cramps.  Avoid heavy lifting, wear low heels, and sit up straight.  Do not exercise if it is too hot, too humid, or if you are in a place of great height (high altitude).  You may continue to have sex unless your doctor tells you not to. Relieving pain and discomfort  Wear a good support bra if your breasts are tender.  Take frequent breaks and rest with your legs raised if you have leg cramps or low back pain.  Take warm water baths (sitz baths) to soothe pain or discomfort caused by hemorrhoids. Use hemorrhoid cream if your doctor approves.  If you develop puffy, bulging veins (varicose veins) in your legs: ? Wear support hose or compression stockings as told by your doctor. ? Raise (elevate) your feet for 15 minutes, 3-4 times a day. ? Limit salt in your food. Safety  Wear your seat belt when driving.  Make a list of emergency phone numbers, including numbers for family, friends, the hospital, and police and fire departments. Preparing for your baby's arrival   To prepare for the arrival of your baby:  Take prenatal classes.  Practice driving to the hospital.  Visit the hospital and tour the maternity area.  Talk to your work about taking leave once the baby comes.  Pack your hospital bag.  Prepare the baby's room.  Go to your doctor visits.  Buy a rear-facing car seat. Learn how to install it in your car. General instructions  Do not use hot tubs, steam rooms, or saunas.  Do not use any products  that contain nicotine or tobacco, such as cigarettes and e-cigarettes. If you need help quitting, ask your doctor.  Do not drink alcohol.  Do not douche or use tampons or scented sanitary pads.  Do not cross your legs for long periods of time.  Do not travel for long distances unless you must. Only do so if your doctor says it is okay.  Visit your dentist if you have not gone during your pregnancy. Use a soft toothbrush to brush your teeth. Be gentle when you floss.  Avoid cat litter boxes and soil used by cats. These carry germs that can cause birth defects in the baby and can cause a loss of your baby (miscarriage) or stillbirth.  Keep all your prenatal visits as told by your doctor. This is important. Contact a doctor if:  You are not sure if you are in labor or if your water has broken.  You are dizzy.  You have mild cramps or pressure in your lower belly.  You have a nagging pain in your belly area.  You continue to feel sick to your stomach, you throw up, or you have watery poop.  You have bad smelling fluid coming from your vagina.  You have pain when you pee. Get help right away if:  You have a fever.  You are leaking fluid from your vagina.  You are spotting or bleeding from your vagina.  You have severe belly cramps or pain.  You lose or gain weight quickly.  You have trouble catching your breath and have chest pain.  You notice sudden or extreme puffiness (swelling) of your face, hands, ankles, feet, or legs.  You have not felt the baby move in over an hour.  You have severe headaches that do not go away with medicine.  You have trouble seeing.  You are leaking, or you are having a gush of fluid, from your vagina before you are 37 weeks.  You have regular belly spasms (contractions) before you are 37 weeks. Summary  The third trimester is from week 28 through week 40 (months 7 through 9). This time is when your unborn baby is growing very  fast.  Follow your doctor's advice about medicine, food, and activity.  Get ready for the arrival of your baby by taking prenatal classes, getting all the baby items ready, preparing the baby's room, and visiting your doctor to be checked.  Get help right away if you are bleeding from your vagina, or you have chest pain and trouble catching your breath, or if you have not felt your baby move in over an hour. This information is not intended to replace advice given to you by your health care provider. Make sure you discuss any questions you have with your health care provider. Document Revised: 04/28/2018 Document Reviewed: 02/11/2016 Elsevier Patient Education  2020 [employer]. Common Medications Safe in Pregnancy  Acne:      Constipation:  Benzoyl Peroxide     Colace    Clindamycin      Dulcolax Suppository  Topica Erythromycin     Fibercon  Salicylic Acid      Metamucil         Miralax AVOID:        Senakot   Accutane    Cough:  Retin-A       Cough Drops  Tetracycline      Phenergan w/ Codeine if Rx  Minocycline      Robitussin (Plain & DM)  Antibiotics:     Crabs/Lice:  Ceclor       RID  Cephalosporins    AVOID:  E-Mycins      Kwell  Keflex  Macrobid/Macrodantin   Diarrhea:  Penicillin      Kao-Pectate  Zithromax      Imodium AD         PUSH FLUIDS AVOID:       Cipro     Fever:  Tetracycline      Tylenol (Regular or Extra  Minocycline       Strength)  Levaquin      Extra Strength-Do not          Exceed 8 tabs/24 hrs Caffeine:        <200mg/day (equiv. To 1 cup of coffee or  approx. 3 12 oz sodas)         Gas: Cold/Hayfever:       Gas-X  Benadryl      Mylicon  Claritin       Phazyme  **Claritin-D        Chlor-Trimeton    Headaches:  Dimetapp      ASA-Free Excedrin  Drixoral-Non-Drowsy     Cold Compress  Mucinex (Guaifenasin)     Tylenol (Regular or Extra  Sudafed/Sudafed-12 Hour     Strength)  **Sudafed PE Pseudoephedrine   Tylenol Cold & Sinus     Vicks Vapor  Rub  Zyrtec  **AVOID if Problems With Blood Pressure         Heartburn: Avoid lying down for at least 1 hour after meals  Aciphex      Maalox     Rash:  Milk of Magnesia     Benadryl    Mylanta       1% Hydrocortisone Cream  Pepcid  Pepcid Complete   Sleep Aids:  Prevacid      Ambien   Prilosec       Benadryl  Rolaids       Chamomile Tea  Tums (Limit 4/day)     Unisom  Zantac       Tylenol PM         Warm milk-add vanilla or  Hemorrhoids:       Sugar for taste  Anusol/Anusol H.C.  (RX: Analapram 2.5%)  Sugar Substitutes:  Hydrocortisone OTC     Ok in moderation  Preparation H      Tucks        Vaseline lotion applied to tissue with wiping    Herpes:     Throat:  Acyclovir      Oragel  Famvir  Valtrex     Vaccines:         Flu Shot Leg Cramps:       *Gardasil  Benadryl      Hepatitis A         Hepatitis B Nasal Spray:       Pneumovax  Saline Nasal Spray     Polio Booster           Tetanus Nausea:       Tuberculosis test or PPD  Vitamin B6 25 mg TID   AVOID:    Dramamine      *Gardasil  Emetrol       Live Poliovirus  Ginger Root 250 mg QID    MMR (measles, mumps &  High Complex Carbs @ Bedtime    rebella)  Sea Bands-Accupressure    Varicella (Chickenpox)  Unisom 1/2 tab TID     *No known complications           If received before Pain:         Known pregnancy;   Darvocet       Resume series after  Lortab        Delivery  Percocet    Yeast:   Tramadol      Femstat  Tylenol 3      Gyne-lotrimin  Ultram       Monistat  Vicodin           MISC:         All Sunscreens           Hair Coloring/highlights          Insect Repellant's          (Including DEET)         Mystic Tans Breastfeeding  Choosing to breastfeed is one of the best decisions you can make for yourself and your baby. A change in hormones during pregnancy causes your breasts to make breast milk in your milk-producing glands. Hormones prevent breast milk from being released before your baby is  born. They also prompt milk flow after birth. Once breastfeeding has begun, thoughts of your baby, as well as his or her sucking or crying, can stimulate the release of milk from your milk-producing glands. Benefits of breastfeeding Research shows that breastfeeding offers many health benefits for infants and mothers. It also offers a cost-free and convenient way to feed your baby. For your baby  Your first milk (colostrum) helps your baby's digestive system to function better.  Special cells in your milk (antibodies) help your baby to fight off infections.  Breastfed babies are less likely to develop asthma, allergies, obesity, or type 2 diabetes. They are also at lower risk for sudden infant death syndrome (SIDS).  Nutrients in breast milk are better able to meet your baby's needs compared to infant formula.  Breast milk improves your baby's brain development. For you  Breastfeeding helps to create a very special bond between you and your baby.  Breastfeeding is convenient. Breast milk costs nothing and is always available at the correct temperature.  Breastfeeding helps to burn calories. It helps you to lose the weight that you gained during pregnancy.  Breastfeeding makes your uterus return faster to its size before pregnancy. It also slows bleeding (lochia) after you give birth.  Breastfeeding helps to lower your risk of developing type 2 diabetes, osteoporosis, rheumatoid arthritis, cardiovascular disease, and breast, ovarian, uterine, and endometrial cancer later in life. Breastfeeding basics Starting breastfeeding  Find a comfortable place to sit or lie down, with your neck and back well-supported.  Place a pillow or a rolled-up blanket under your baby to bring him or her to the level of your breast (if you are seated). Nursing pillows are specially designed to help support your arms and your baby while you breastfeed.  Make sure that your baby's tummy (abdomen) is facing your  abdomen.  Gently massage your breast.  With your fingertips, massage from the outer edges of your breast inward toward the nipple. This encourages milk flow. If your milk flows slowly, you may need to continue this action during the feeding.  Support your breast with 4 fingers underneath and your thumb above your nipple (make the letter "C" with your hand). Make sure your fingers are well away from your nipple and your baby's mouth.  Stroke your baby's lips gently with your finger or nipple.  When your baby's mouth is open wide enough, quickly bring your baby to your breast, placing your entire nipple and as much of the areola as possible into your baby's mouth. The areola is the colored area around your nipple. ? More areola should be visible above your baby's upper lip than below the lower lip. ? Your baby's lips should be opened and extended outward (flanged) to ensure an adequate, comfortable latch. ? Your baby's tongue should be between his or her lower gum and your breast.  Make sure that your baby's mouth is correctly positioned around your nipple (latched). Your baby's lips should create a seal on your breast and be turned out (everted).  It is common for your baby to suck about 2-3 minutes in order to start the flow of breast milk. Latching Teaching your baby how to latch onto your breast properly is very important. An improper latch can cause nipple pain, decreased milk supply, and poor weight gain in your baby. Also, if your baby is not latched onto your nipple properly, he or she may swallow some air during feeding. This can make your baby fussy. Burping your baby when you switch breasts during the feeding can help to get rid of the air. However, teaching your baby to latch on properly is still the best way to prevent fussiness from swallowing air while breastfeeding. Signs that your baby has successfully latched onto your nipple  Silent tugging or silent sucking, without causing you  pain. Infant's lips should be extended outward (flanged).  Swallowing heard between every 3-4 sucks once your milk has started to flow (after your let-down milk reflex occurs).  Muscle movement above and in front of his or her ears while sucking. Signs that your baby has not successfully latched onto your nipple  Sucking sounds or smacking sounds from your baby while breastfeeding.  Nipple pain. If you think your baby has not latched on correctly, slip your finger into the corner of your baby's mouth to break the suction and place it between your baby's gums. Attempt to start breastfeeding again. Signs of successful breastfeeding Signs from your baby  Your baby will gradually decrease the number of sucks or will completely stop sucking.  Your baby will fall asleep.  Your baby's body will relax.  Your baby will retain a small amount of milk in his or her mouth.  Your baby will let go of your breast by himself or herself. Signs from you  Breasts that have increased in firmness, weight, and size 1-3 hours after feeding.  Breasts that are softer immediately after breastfeeding.  Increased milk volume, as well as a change in milk consistency and color by the fifth day of breastfeeding.  Nipples that are not sore, cracked, or bleeding. Signs that your baby is getting enough milk  Wetting at least 1-2 diapers during the first 24 hours after birth.  Wetting at least 5-6 diapers every 24 hours for the first week after birth. The urine should be clear or pale yellow by  the age of 5 days.  Wetting 6-8 diapers every 24 hours as your baby continues to grow and develop.  At least 3 stools in a 24-hour period by the age of 5 days. The stool should be soft and yellow.  At least 3 stools in a 24-hour period by the age of 7 days. The stool should be seedy and yellow.  No loss of weight greater than 10% of birth weight during the first 3 days of life.  Average weight gain of 4-7 oz  (113-198 g) per week after the age of 4 days.  Consistent daily weight gain by the age of 5 days, without weight loss after the age of 2 weeks. After a feeding, your baby may spit up a small amount of milk. This is normal. Breastfeeding frequency and duration Frequent feeding will help you make more milk and can prevent sore nipples and extremely full breasts (breast engorgement). Breastfeed when you feel the need to reduce the fullness of your breasts or when your baby shows signs of hunger. This is called "breastfeeding on demand." Signs that your baby is hungry include:  Increased alertness, activity, or restlessness.  Movement of the head from side to side.  Opening of the mouth when the corner of the mouth or cheek is stroked (rooting).  Increased sucking sounds, smacking lips, cooing, sighing, or squeaking.  Hand-to-mouth movements and sucking on fingers or hands.  Fussing or crying. Avoid introducing a pacifier to your baby in the first 4-6 weeks after your baby is born. After this time, you may choose to use a pacifier. Research has shown that pacifier use during the first year of a baby's life decreases the risk of sudden infant death syndrome (SIDS). Allow your baby to feed on each breast as long as he or she wants. When your baby unlatches or falls asleep while feeding from the first breast, offer the second breast. Because newborns are often sleepy in the first few weeks of life, you may need to awaken your baby to get him or her to feed. Breastfeeding times will vary from baby to baby. However, the following rules can serve as a guide to help you make sure that your baby is properly fed:  Newborns (babies 39 weeks of age or younger) may breastfeed every 1-3 hours.  Newborns should not go without breastfeeding for longer than 3 hours during the day or 5 hours during the night.  You should breastfeed your baby a minimum of 8 times in a 24-hour period. Breast milk pumping      Pumping and storing breast milk allows you to make sure that your baby is exclusively fed your breast milk, even at times when you are unable to breastfeed. This is especially important if you go back to work while you are still breastfeeding, or if you are not able to be present during feedings. Your lactation consultant can help you find a method of pumping that works best for you and give you guidelines about how long it is safe to store breast milk. Caring for your breasts while you breastfeed Nipples can become dry, cracked, and sore while breastfeeding. The following recommendations can help keep your breasts moisturized and healthy:  Avoid using soap on your nipples.  Wear a supportive bra designed especially for nursing. Avoid wearing underwire-style bras or extremely tight bras (sports bras).  Air-dry your nipples for 3-4 minutes after each feeding.  Use only cotton bra pads to absorb leaked breast milk. Leaking  of breast milk between feedings is normal.  Use lanolin on your nipples after breastfeeding. Lanolin helps to maintain your skin's normal moisture barrier. Pure lanolin is not harmful (not toxic) to your baby. You may also hand express a few drops of breast milk and gently massage that milk into your nipples and allow the milk to air-dry. In the first few weeks after giving birth, some women experience breast engorgement. Engorgement can make your breasts feel heavy, warm, and tender to the touch. Engorgement peaks within 3-5 days after you give birth. The following recommendations can help to ease engorgement:  Completely empty your breasts while breastfeeding or pumping. You may want to start by applying warm, moist heat (in the shower or with warm, water-soaked hand towels) just before feeding or pumping. This increases circulation and helps the milk flow. If your baby does not completely empty your breasts while breastfeeding, pump any extra milk after he or she is  finished.  Apply ice packs to your breasts immediately after breastfeeding or pumping, unless this is too uncomfortable for you. To do this: ? Put ice in a plastic bag. ? Place a towel between your skin and the bag. ? Leave the ice on for 20 minutes, 2-3 times a day.  Make sure that your baby is latched on and positioned properly while breastfeeding. If engorgement persists after 48 hours of following these recommendations, contact your health care provider or a lactation consultant. Overall health care recommendations while breastfeeding  Eat 3 healthy meals and 3 snacks every day. Well-nourished mothers who are breastfeeding need an additional 450-500 calories a day. You can meet this requirement by increasing the amount of a balanced diet that you eat.  Drink enough water to keep your urine pale yellow or clear.  Rest often, relax, and continue to take your prenatal vitamins to prevent fatigue, stress, and low vitamin and mineral levels in your body (nutrient deficiencies).  Do not use any products that contain nicotine or tobacco, such as cigarettes and e-cigarettes. Your baby may be harmed by chemicals from cigarettes that pass into breast milk and exposure to secondhand smoke. If you need help quitting, ask your health care provider.  Avoid alcohol.  Do not use illegal drugs or marijuana.  Talk with your health care provider before taking any medicines. These include over-the-counter and prescription medicines as well as vitamins and herbal supplements. Some medicines that may be harmful to your baby can pass through breast milk.  It is possible to become pregnant while breastfeeding. If birth control is desired, ask your health care provider about options that will be safe while breastfeeding your baby. Where to find more information: [employer]: www.llli.org Contact a health care provider if:  You feel like you want to stop breastfeeding or have become  frustrated with breastfeeding.  Your nipples are cracked or bleeding.  Your breasts are red, tender, or warm.  You have: ? Painful breasts or nipples. ? A swollen area on either breast. ? A fever or chills. ? Nausea or vomiting. ? Drainage other than breast milk from your nipples.  Your breasts do not become full before feedings by the fifth day after you give birth.  You feel sad and depressed.  Your baby is: ? Too sleepy to eat well. ? Having trouble sleeping. ? More than 1 week old and wetting fewer than 6 diapers in a 24-hour period. ? Not gaining weight by 5 days of age.  Your baby has   fewer than 3 stools in a 24-hour period.  Your baby's skin or the white parts of his or her eyes become yellow. Get help right away if:  Your baby is overly tired (lethargic) and does not want to wake up and feed.  Your baby develops an unexplained fever. Summary  Breastfeeding offers many health benefits for infant and mothers.  Try to breastfeed your infant when he or she shows early signs of hunger.  Gently tickle or stroke your baby's lips with your finger or nipple to allow the baby to open his or her mouth. Bring the baby to your breast. Make sure that much of the areola is in your baby's mouth. Offer one side and burp the baby before you offer the other side.  Talk with your health care provider or lactation consultant if you have questions or you face problems as you breastfeed. This information is not intended to replace advice given to you by your health care provider. Make sure you discuss any questions you have with your health care provider. Document Revised: 04/01/2017 Document Reviewed: 02/07/2016 Elsevier Patient Education  2020 Elsevier Inc.  

## 2019-06-28 NOTE — Progress Notes (Signed)
ROB-Pt present for routine prenatal care, 28 week labs, BTC and tdap vaccine. Pt c/o lower abd pain and having braxton hick contractions.

## 2019-06-30 ENCOUNTER — Encounter: Payer: Self-pay | Admitting: Obstetrics and Gynecology

## 2019-06-30 ENCOUNTER — Other Ambulatory Visit: Payer: Self-pay

## 2019-06-30 ENCOUNTER — Other Ambulatory Visit: Payer: BC Managed Care – PPO

## 2019-06-30 ENCOUNTER — Ambulatory Visit (INDEPENDENT_AMBULATORY_CARE_PROVIDER_SITE_OTHER): Payer: BC Managed Care – PPO | Admitting: Obstetrics and Gynecology

## 2019-06-30 VITALS — BP 103/68 | HR 98 | Wt 162.3 lb

## 2019-06-30 DIAGNOSIS — Z3483 Encounter for supervision of other normal pregnancy, third trimester: Secondary | ICD-10-CM

## 2019-06-30 DIAGNOSIS — Z23 Encounter for immunization: Secondary | ICD-10-CM | POA: Diagnosis not present

## 2019-06-30 DIAGNOSIS — Z3A28 28 weeks gestation of pregnancy: Secondary | ICD-10-CM

## 2019-06-30 DIAGNOSIS — O479 False labor, unspecified: Secondary | ICD-10-CM

## 2019-06-30 DIAGNOSIS — O99343 Other mental disorders complicating pregnancy, third trimester: Secondary | ICD-10-CM

## 2019-06-30 DIAGNOSIS — F329 Major depressive disorder, single episode, unspecified: Secondary | ICD-10-CM

## 2019-06-30 LAB — POCT URINALYSIS DIPSTICK OB
Bilirubin, UA: NEGATIVE
Blood, UA: NEGATIVE
Glucose, UA: NEGATIVE
Ketones, UA: NEGATIVE
Nitrite, UA: NEGATIVE
Spec Grav, UA: 1.015 (ref 1.010–1.025)
Urobilinogen, UA: 0.2 E.U./dL
pH, UA: 7 (ref 5.0–8.0)

## 2019-06-30 MED ORDER — TETANUS-DIPHTH-ACELL PERTUSSIS 5-2.5-18.5 LF-MCG/0.5 IM SUSP
0.5000 mL | Freq: Once | INTRAMUSCULAR | Status: AC
  Administered 2019-06-30: 0.5 mL via INTRAMUSCULAR

## 2019-06-30 NOTE — Progress Notes (Addendum)
ROB: Noting some Braxton Hicks ctx. For 28 week labs today.  Desires to breastfeed,  desires hormonal IUD for contraception. For Tdap today, signed blood consent. Desires epidural for pain management. Discussed for need for growth scan at 32-34 weeks for h/o depression currently managed on Celexa. RTC in 2 weeks.    The following were addressed during this visit:  Breastfeeding Education - Nonpharmacological pain relief methods for labor    Comments: Deep breathing, focusing on pleasant things, movement and walking, heating pads or cold compress, massage and relaxation, continuous support from someone you trust, and Doulas   - The importance of early skin-to-skin contact    Comments: Keeps baby warm and secure, helps keep babys blood sugar up and breathing steady, easier to bond and breastfeed, and helps calm baby.  - Rooming-in on a 24-hour basis    Comments: Easier to learn baby's feeding cues, easier to bond and get to know each other, and encourages milk production.   - Feeding on demand or baby-led feeding    Comments: Helps prevent breastfeeding complications, helps bring in good milk supply, prevents under or overfeeding, and helps baby feel content and satisfied   - Frequent feeding to help assure optimal milk production    Comments: Making a full supply of milk requires frequent removal of milk from breasts, infant will eat 8-12 times in 24 hours, if separated from infant use breast massage, hand expression and/ or pumping to remove milk from breasts.   - Effective positioning and attachment    Comments: Helps my baby to get enough breast milk, helps to produce an adequate milk supply, and helps prevent nipple pain and damage   - Exclusive breastfeeding for the first 6 months    Comments: Builds a healthy milk supply and keeps it up, protects baby from sickness and disease, and breastmilk has everything your baby needs for the first 6 months.

## 2019-07-01 LAB — CBC
Hematocrit: 36.7 % (ref 34.0–46.6)
Hemoglobin: 12.2 g/dL (ref 11.1–15.9)
MCH: 28.6 pg (ref 26.6–33.0)
MCHC: 33.2 g/dL (ref 31.5–35.7)
MCV: 86 fL (ref 79–97)
Platelets: 309 10*3/uL (ref 150–450)
RBC: 4.27 x10E6/uL (ref 3.77–5.28)
RDW: 11.9 % (ref 11.7–15.4)
WBC: 8.8 10*3/uL (ref 3.4–10.8)

## 2019-07-01 LAB — RPR: RPR Ser Ql: NONREACTIVE

## 2019-07-01 LAB — GLUCOSE, 1 HOUR GESTATIONAL: Gestational Diabetes Screen: 70 mg/dL (ref 65–139)

## 2019-07-14 ENCOUNTER — Encounter: Payer: Self-pay | Admitting: Obstetrics and Gynecology

## 2019-07-14 ENCOUNTER — Other Ambulatory Visit: Payer: Self-pay

## 2019-07-14 ENCOUNTER — Ambulatory Visit (INDEPENDENT_AMBULATORY_CARE_PROVIDER_SITE_OTHER): Payer: BC Managed Care – PPO | Admitting: Obstetrics and Gynecology

## 2019-07-14 VITALS — BP 106/64 | HR 91 | Wt 164.8 lb

## 2019-07-14 DIAGNOSIS — Z3483 Encounter for supervision of other normal pregnancy, third trimester: Secondary | ICD-10-CM

## 2019-07-14 DIAGNOSIS — O99343 Other mental disorders complicating pregnancy, third trimester: Secondary | ICD-10-CM

## 2019-07-14 DIAGNOSIS — F329 Major depressive disorder, single episode, unspecified: Secondary | ICD-10-CM

## 2019-07-14 DIAGNOSIS — Z3A30 30 weeks gestation of pregnancy: Secondary | ICD-10-CM

## 2019-07-14 LAB — POCT URINALYSIS DIPSTICK OB
Bilirubin, UA: NEGATIVE
Blood, UA: NEGATIVE
Glucose, UA: NEGATIVE
Ketones, UA: NEGATIVE
Leukocytes, UA: NEGATIVE
Nitrite, UA: NEGATIVE
POC,PROTEIN,UA: NEGATIVE
Spec Grav, UA: 1.015 (ref 1.010–1.025)
Urobilinogen, UA: 0.2 E.U./dL
pH, UA: 6.5 (ref 5.0–8.0)

## 2019-07-14 NOTE — Progress Notes (Signed)
ROB: Patient without complaint.  Occasional Braxton Hicks contractions.  Daily fetal movement.  Ultrasound scheduled for next week (Celexa).

## 2019-07-26 ENCOUNTER — Other Ambulatory Visit: Payer: Self-pay

## 2019-07-26 ENCOUNTER — Encounter: Payer: Self-pay | Admitting: Obstetrics and Gynecology

## 2019-07-26 ENCOUNTER — Ambulatory Visit (INDEPENDENT_AMBULATORY_CARE_PROVIDER_SITE_OTHER): Payer: BC Managed Care – PPO | Admitting: Obstetrics and Gynecology

## 2019-07-26 ENCOUNTER — Ambulatory Visit (INDEPENDENT_AMBULATORY_CARE_PROVIDER_SITE_OTHER): Payer: BC Managed Care – PPO

## 2019-07-26 VITALS — BP 101/64 | HR 111 | Wt 168.1 lb

## 2019-07-26 DIAGNOSIS — F329 Major depressive disorder, single episode, unspecified: Secondary | ICD-10-CM

## 2019-07-26 DIAGNOSIS — O2653 Maternal hypotension syndrome, third trimester: Secondary | ICD-10-CM

## 2019-07-26 DIAGNOSIS — Z3483 Encounter for supervision of other normal pregnancy, third trimester: Secondary | ICD-10-CM

## 2019-07-26 DIAGNOSIS — O99343 Other mental disorders complicating pregnancy, third trimester: Secondary | ICD-10-CM

## 2019-07-26 DIAGNOSIS — O409XX0 Polyhydramnios, unspecified trimester, not applicable or unspecified: Secondary | ICD-10-CM

## 2019-07-26 DIAGNOSIS — Z3A32 32 weeks gestation of pregnancy: Secondary | ICD-10-CM

## 2019-07-26 DIAGNOSIS — O289 Unspecified abnormal findings on antenatal screening of mother: Secondary | ICD-10-CM

## 2019-07-26 LAB — POCT URINALYSIS DIPSTICK OB
Bilirubin, UA: NEGATIVE
Blood, UA: NEGATIVE
Glucose, UA: NEGATIVE
Ketones, UA: NEGATIVE
Leukocytes, UA: NEGATIVE
Nitrite, UA: NEGATIVE
POC,PROTEIN,UA: NEGATIVE
Spec Grav, UA: 1.02 (ref 1.010–1.025)
Urobilinogen, UA: 0.2 E.U./dL
pH, UA: 7 (ref 5.0–8.0)

## 2019-07-26 NOTE — Progress Notes (Signed)
ROB: Notes occasionally seeing spots and palpitations. Denies SOB, dizziness. Notes drinking enough water most days. Discussed that her BPs have been borderline low, may be a cause. Advised to increase fluids, can occasionally consume salted products (peanuts, pretzels, chips).  Also notes round ligament pain on right . S/p growth ultrasound for use of anti-depressants in pregnancy, normal growth, AFI at upper limits (20.9). Can repeat again in 4 weeks.

## 2019-07-26 NOTE — Patient Instructions (Signed)

## 2019-07-26 NOTE — Progress Notes (Signed)
ROB-Pt present for routine prenatal care and ultrasound. Pt c/o of lower abd pain, right side groin area pain when walking and braxton hick contractions.

## 2019-08-16 ENCOUNTER — Encounter: Payer: Self-pay | Admitting: Obstetrics and Gynecology

## 2019-08-16 ENCOUNTER — Ambulatory Visit (INDEPENDENT_AMBULATORY_CARE_PROVIDER_SITE_OTHER): Payer: BC Managed Care – PPO | Admitting: Obstetrics and Gynecology

## 2019-08-16 VITALS — BP 110/70 | HR 102 | Wt 172.7 lb

## 2019-08-16 DIAGNOSIS — Z3483 Encounter for supervision of other normal pregnancy, third trimester: Secondary | ICD-10-CM

## 2019-08-16 DIAGNOSIS — Z3A35 35 weeks gestation of pregnancy: Secondary | ICD-10-CM

## 2019-08-16 LAB — POCT URINALYSIS DIPSTICK OB
Bilirubin, UA: NEGATIVE
Blood, UA: NEGATIVE
Glucose, UA: NEGATIVE
Ketones, UA: NEGATIVE
Leukocytes, UA: NEGATIVE
Nitrite, UA: NEGATIVE
POC,PROTEIN,UA: NEGATIVE
Spec Grav, UA: 1.015 (ref 1.010–1.025)
Urobilinogen, UA: 0.2 E.U./dL
pH, UA: 7 (ref 5.0–8.0)

## 2019-08-16 NOTE — Progress Notes (Signed)
ROB: Patient complains of occasional lightheadedness especially when standing.  She does not lose consciousness.  Episodes last approximately 30 seconds.  She says she is being well hydrated.  Occasional "mild" contractions.  Reports daily fetal movement.  Cultures next visit

## 2019-08-23 ENCOUNTER — Ambulatory Visit (INDEPENDENT_AMBULATORY_CARE_PROVIDER_SITE_OTHER): Payer: BC Managed Care – PPO | Admitting: Obstetrics and Gynecology

## 2019-08-23 ENCOUNTER — Encounter: Payer: Self-pay | Admitting: Obstetrics and Gynecology

## 2019-08-23 VITALS — BP 101/67 | HR 96 | Wt 174.0 lb

## 2019-08-23 DIAGNOSIS — Z3483 Encounter for supervision of other normal pregnancy, third trimester: Secondary | ICD-10-CM

## 2019-08-23 DIAGNOSIS — O289 Unspecified abnormal findings on antenatal screening of mother: Secondary | ICD-10-CM

## 2019-08-23 DIAGNOSIS — Z3A36 36 weeks gestation of pregnancy: Secondary | ICD-10-CM

## 2019-08-23 DIAGNOSIS — O409XX0 Polyhydramnios, unspecified trimester, not applicable or unspecified: Secondary | ICD-10-CM

## 2019-08-23 LAB — POCT URINALYSIS DIPSTICK OB
Bilirubin, UA: NEGATIVE
Blood, UA: NEGATIVE
Glucose, UA: NEGATIVE
Ketones, UA: NEGATIVE
POC,PROTEIN,UA: NEGATIVE
Spec Grav, UA: 1.01 (ref 1.010–1.025)
Urobilinogen, UA: 0.2 E.U./dL
pH, UA: 8.5 — AB (ref 5.0–8.0)

## 2019-08-23 LAB — OB RESULTS CONSOLE GC/CHLAMYDIA: Gonorrhea: NEGATIVE

## 2019-08-23 NOTE — Progress Notes (Signed)
ROB: Notes pelvic pressure and pain. Has a pregnancy band but notes it is too loose. Also has a birth ball at home. 36 week labs done today. RTC in 1 week, for f/u ultrasound for elevated AFI (20.9 cm on 32 week scan).

## 2019-08-23 NOTE — Progress Notes (Signed)
ROB- Pt present for routine prenatal care. Pt states lower abdominal and vaginal pain.

## 2019-08-25 LAB — GC/CHLAMYDIA PROBE AMP
Chlamydia trachomatis, NAA: NEGATIVE
Neisseria Gonorrhoeae by PCR: NEGATIVE

## 2019-08-25 LAB — STREP GP B NAA: Strep Gp B NAA: NEGATIVE

## 2019-08-29 IMAGING — CR DG CHEST 2V
2 series · 2 of 2 positions shown · non-contrast
Comparison: None.

CLINICAL DATA: 21-year-old female with chest pain.

EXAM:
CHEST  2 VIEW

[chest lat]
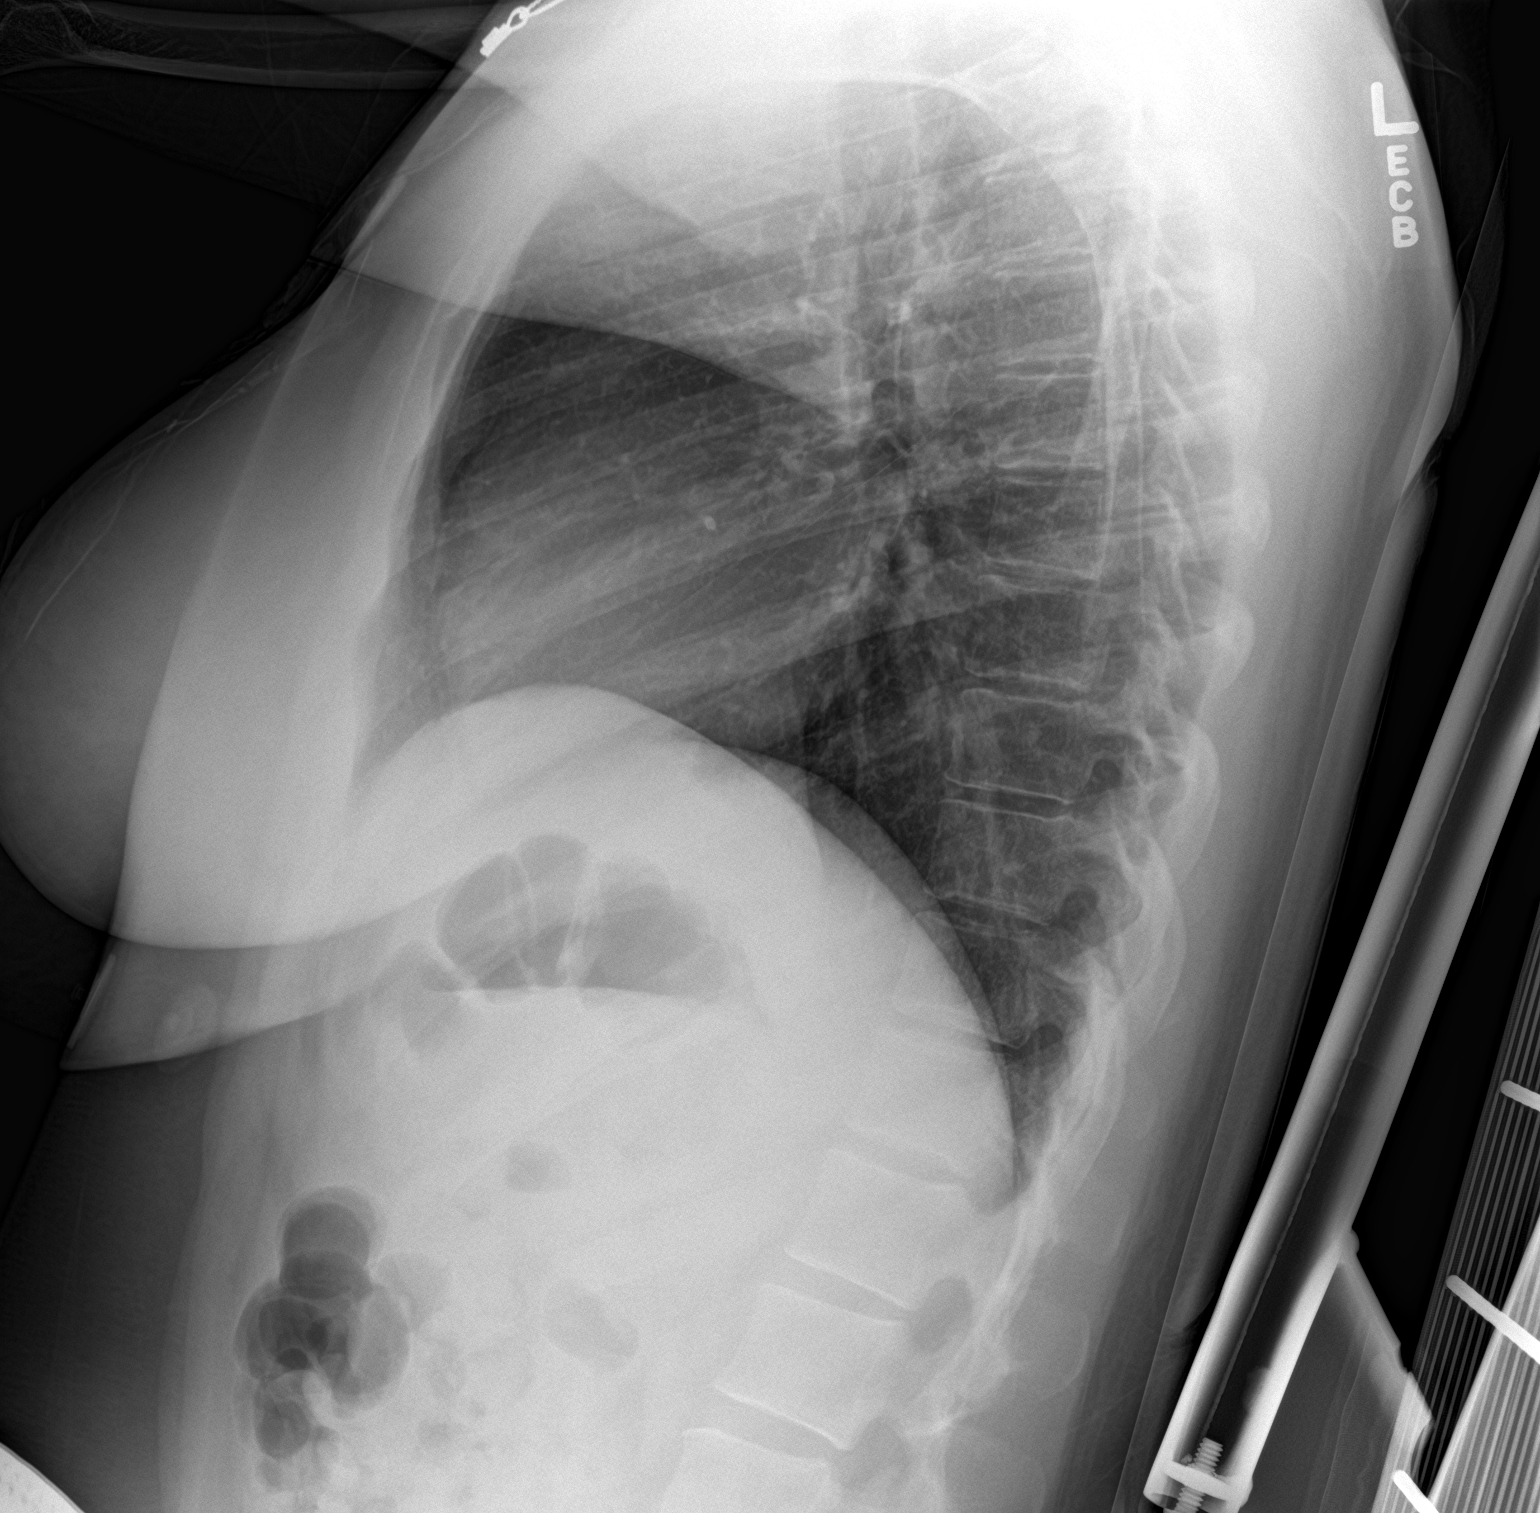

[chest ap]
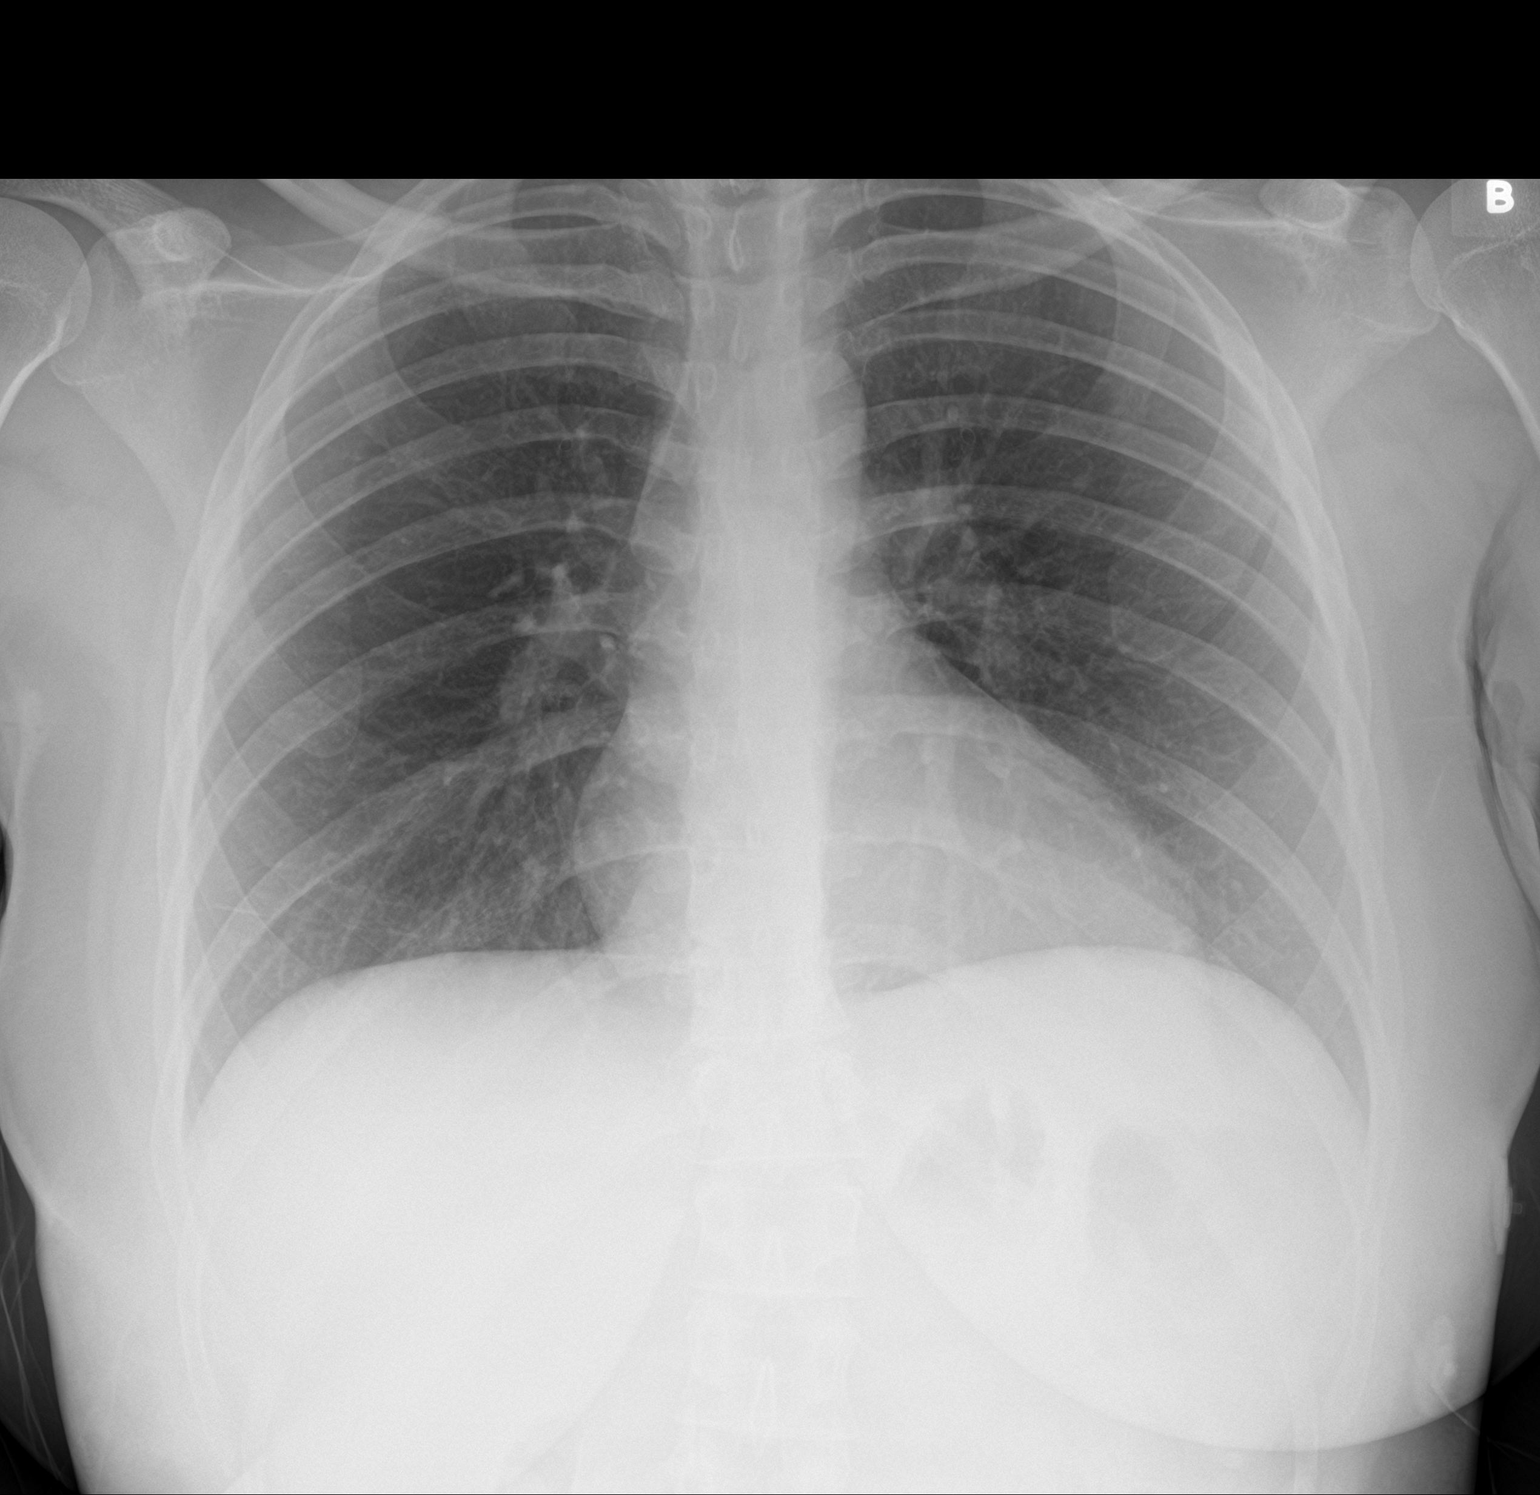

[2 of 2 positions shown; findings below may reference images not displayed]

FINDINGS: The heart size and mediastinal contours are within normal limits.
Both lungs are clear. The visualized skeletal structures are
unremarkable.
IMPRESSION: No active cardiopulmonary disease.

## 2019-08-31 ENCOUNTER — Ambulatory Visit (INDEPENDENT_AMBULATORY_CARE_PROVIDER_SITE_OTHER): Payer: BC Managed Care – PPO

## 2019-08-31 ENCOUNTER — Ambulatory Visit (INDEPENDENT_AMBULATORY_CARE_PROVIDER_SITE_OTHER): Payer: BC Managed Care – PPO | Admitting: Obstetrics and Gynecology

## 2019-08-31 ENCOUNTER — Encounter: Payer: Self-pay | Admitting: Obstetrics and Gynecology

## 2019-08-31 VITALS — BP 107/72 | HR 106 | Wt 176.5 lb

## 2019-08-31 DIAGNOSIS — O409XX0 Polyhydramnios, unspecified trimester, not applicable or unspecified: Secondary | ICD-10-CM

## 2019-08-31 DIAGNOSIS — Z3A37 37 weeks gestation of pregnancy: Secondary | ICD-10-CM

## 2019-08-31 DIAGNOSIS — O289 Unspecified abnormal findings on antenatal screening of mother: Secondary | ICD-10-CM | POA: Diagnosis not present

## 2019-08-31 DIAGNOSIS — Z3483 Encounter for supervision of other normal pregnancy, third trimester: Secondary | ICD-10-CM

## 2019-08-31 LAB — POCT URINALYSIS DIPSTICK OB
Bilirubin, UA: NEGATIVE
Blood, UA: NEGATIVE
Glucose, UA: NEGATIVE
Ketones, UA: NEGATIVE
Leukocytes, UA: NEGATIVE
Nitrite, UA: NEGATIVE
POC,PROTEIN,UA: NEGATIVE
Spec Grav, UA: 1.01 (ref 1.010–1.025)
Urobilinogen, UA: 0.2 E.U./dL
pH, UA: 7.5 (ref 5.0–8.0)

## 2019-08-31 NOTE — Progress Notes (Signed)
Patient comes in today for ROB visit. No concerns.  

## 2019-08-31 NOTE — Progress Notes (Signed)
ROB: Having multiple contractions daily but not regular.  She says she is "over it" and is ready to just have the baby.  Ultrasound today shows a slight decrease in amniotic fluid but still elevated at 18.  Signs and symptoms of labor discussed.  Cervical exam next visit.

## 2019-09-05 NOTE — Progress Notes (Signed)
ROB-Pt present for routine prenatal care. Pt stated having lower abd and vaginal pain and pressure and braxton hick contractions.

## 2019-09-06 ENCOUNTER — Encounter: Payer: Self-pay | Admitting: Obstetrics and Gynecology

## 2019-09-06 ENCOUNTER — Other Ambulatory Visit: Payer: Self-pay

## 2019-09-06 ENCOUNTER — Ambulatory Visit (INDEPENDENT_AMBULATORY_CARE_PROVIDER_SITE_OTHER): Payer: BC Managed Care – PPO | Admitting: Obstetrics and Gynecology

## 2019-09-06 VITALS — BP 104/70 | HR 96 | Wt 178.3 lb

## 2019-09-06 DIAGNOSIS — Z3483 Encounter for supervision of other normal pregnancy, third trimester: Secondary | ICD-10-CM

## 2019-09-06 DIAGNOSIS — Z3A38 38 weeks gestation of pregnancy: Secondary | ICD-10-CM

## 2019-09-06 LAB — POCT URINALYSIS DIPSTICK OB
Bilirubin, UA: NEGATIVE
Blood, UA: NEGATIVE
Glucose, UA: NEGATIVE
Ketones, UA: NEGATIVE
Leukocytes, UA: NEGATIVE
Nitrite, UA: NEGATIVE
POC,PROTEIN,UA: NEGATIVE
Spec Grav, UA: <=1.005 — AB (ref 1.010–1.025)
Urobilinogen, UA: 0.2 E.U./dL
pH, UA: 7.5 (ref 5.0–8.0)

## 2019-09-06 NOTE — Patient Instructions (Signed)

## 2019-09-06 NOTE — Progress Notes (Signed)
ROB: Complains of occasional contractions, pelvic pain.  Discussed labor precautions. Patient inquires about membrane sweeping, advised that it could be performed after 39 weeks. Given handout on herbal cervical ripening methods. Had normal AFI on last scan. RTC in 1 week.

## 2019-09-08 ENCOUNTER — Other Ambulatory Visit: Payer: Self-pay

## 2019-09-08 ENCOUNTER — Encounter: Payer: Self-pay | Admitting: Obstetrics and Gynecology

## 2019-09-08 ENCOUNTER — Ambulatory Visit (INDEPENDENT_AMBULATORY_CARE_PROVIDER_SITE_OTHER): Payer: BC Managed Care – PPO | Admitting: Obstetrics and Gynecology

## 2019-09-08 VITALS — BP 99/68 | HR 111 | Wt 178.4 lb

## 2019-09-08 DIAGNOSIS — Z3483 Encounter for supervision of other normal pregnancy, third trimester: Secondary | ICD-10-CM | POA: Diagnosis not present

## 2019-09-08 DIAGNOSIS — Z3A38 38 weeks gestation of pregnancy: Secondary | ICD-10-CM

## 2019-09-08 LAB — POCT URINALYSIS DIPSTICK OB
Bilirubin, UA: NEGATIVE
Blood, UA: NEGATIVE
Glucose, UA: NEGATIVE
Ketones, UA: NEGATIVE
Leukocytes, UA: NEGATIVE
Nitrite, UA: NEGATIVE
POC,PROTEIN,UA: NEGATIVE
Spec Grav, UA: 1.01 (ref 1.010–1.025)
Urobilinogen, UA: 0.2 E.U./dL
pH, UA: 6.5 (ref 5.0–8.0)

## 2019-09-08 NOTE — Progress Notes (Signed)
Patient added to my schedule today as an emergency visit.  She complains of intermittent lower abdominal pain like cramping.  Not sure if they are "contractions".  Reports active fetal movement.  Denies bleeding denies leakage of fluid.  Abdomen is soft and nontender Cervix is unchanged from last exam  UA shows no evidence of urinary tract infection.  I have reassured her that this is not labor. Recommend follow-up as previously scheduled.  I spent 15 minutes involved in the care of this patient preparing to see the patient by obtaining and reviewing her medical history (including labs, imaging tests and prior procedures), documenting clinical information in the electronic health record (EHR), counseling and coordinating care plans, writing and sending prescriptions, ordering tests or procedures and directly communicating with the patient by discussing pertinent items from her history and physical exam as well as detailing my assessment and plan as noted above so that she has an informed understanding.  All of her questions were answered.

## 2019-09-08 NOTE — Addendum Note (Signed)
Addended by: Dorian Pod on: 09/08/2019 01:39 PM   Modules accepted: Orders

## 2019-09-14 ENCOUNTER — Telehealth: Payer: Self-pay | Admitting: Surgical

## 2019-09-14 ENCOUNTER — Observation Stay (HOSPITAL_BASED_OUTPATIENT_CLINIC_OR_DEPARTMENT_OTHER)
Admission: EM | Admit: 2019-09-14 | Discharge: 2019-09-14 | Disposition: A | Discharge status: Home / Self Care | Payer: BC Managed Care – PPO | Source: Home / Self Care | Admitting: Obstetrics and Gynecology

## 2019-09-14 ENCOUNTER — Encounter: Payer: Self-pay | Admitting: Obstetrics and Gynecology

## 2019-09-14 ENCOUNTER — Ambulatory Visit (INDEPENDENT_AMBULATORY_CARE_PROVIDER_SITE_OTHER): Payer: BC Managed Care – PPO | Admitting: Obstetrics and Gynecology

## 2019-09-14 ENCOUNTER — Other Ambulatory Visit: Payer: Self-pay

## 2019-09-14 VITALS — BP 105/71 | HR 101 | Wt 180.1 lb

## 2019-09-14 DIAGNOSIS — O4292 Full-term premature rupture of membranes, unspecified as to length of time between rupture and onset of labor: Secondary | ICD-10-CM | POA: Insufficient documentation

## 2019-09-14 DIAGNOSIS — Z3490 Encounter for supervision of normal pregnancy, unspecified, unspecified trimester: Secondary | ICD-10-CM

## 2019-09-14 DIAGNOSIS — Z3A39 39 weeks gestation of pregnancy: Secondary | ICD-10-CM

## 2019-09-14 DIAGNOSIS — Z3483 Encounter for supervision of other normal pregnancy, third trimester: Secondary | ICD-10-CM

## 2019-09-14 LAB — POCT URINALYSIS DIPSTICK OB
Bilirubin, UA: NEGATIVE
Blood, UA: NEGATIVE
Glucose, UA: NEGATIVE
Ketones, UA: NEGATIVE
Leukocytes, UA: NEGATIVE
Nitrite, UA: NEGATIVE
Spec Grav, UA: 1.01 (ref 1.010–1.025)
Urobilinogen, UA: 0.2 E.U./dL
pH, UA: 7 (ref 5.0–8.0)

## 2019-09-14 LAB — RUPTURE OF MEMBRANE (ROM)PLUS: Rom Plus: NEGATIVE

## 2019-09-14 NOTE — Telephone Encounter (Signed)
Spoke with patient to let her know that per Dr. Logan Bores to go have her Covid test Monday morning instead of Friday. Patient verbalized understanding.

## 2019-09-14 NOTE — Discharge Instructions (Signed)
First Stage of Labor °Labor is your body's natural process of moving your baby and other structures, including the placenta and umbilical cord, out of your uterus. There are three stages of labor. How long each stage lasts is different for every woman. But certain events happen during each stage that are the same for everyone. °· The first stage starts when true labor begins. This stage ends when your cervix, which is the opening from your uterus into your vagina, is completely open (dilated). °· The second stage begins when your cervix is fully dilated and you start pushing. This stage ends when your baby is born. °· The third stage is the delivery of the organ that nourished your baby during pregnancy (placenta). °First stage of labor °As your due date gets closer, you may start to notice certain physical changes that mean labor is going to start soon. You may feel that your baby has dropped lower into your pelvis. You may experience irregular, often painless, contractions that go away when you walk around or lie down (Braxton Hicks contractions). This is also called false labor. °The first stage of labor begins when you start having contractions that come at regular (evenly spaced) intervals and your cervix starts to get thinner and wider in preparation for your baby to pass through. Birth care providers measure the dilation of your cervix in centimeters (cm). One centimeter is a little less than one-half of an inch. The first stage ends when your cervix is dilated to 10 cm. The first stage of labor is divided into three phases: °· Early phase. °· Active phase. °· Transitional phase. °The length of the first stage of labor varies. It may be longer if this is your first pregnancy. You may spend most of this stage at home trying to relax and stay comfortable. °How does this affect me? °During the first stage of labor, you will move through three phases. °What happens in the early phase? °· You will start to have  regular contractions that last 30-60 seconds. Contractions may come every 5-20 minutes. Keep track of your contractions and call your birth care provider. °· Your water may break during this phase. °· You may notice a clear or slightly bloody discharge of mucus (mucus plug) from your vagina. °· Your cervix will dilate to 3-6 cm. °What happens in the active phase? °The active phase usually lasts 3-5 hours. You may go to the hospital or birth center around this time. During the active phase: °· Your contractions will become stronger, longer, and more uncomfortable. °· Your contractions may last 45-90 seconds and come every 3-5 minutes. °· You may feel lower back pain. °· Your birth care providers may examine your cervix and feel your belly to find the position of your baby. °· You may have a monitor strapped to your belly to measure your contractions and your baby's heart rate. °· You may start using your pain management options. °· Your cervix may be dilated to 6 cm and may start to dilate more quickly. °What happens in the transitional phase? °The transitional phase typically lasts from 30 minutes to 2 hours. At the end of this phase, your cervix will be fully dilated to 10 cm. During the transitional phase: °· Contractions will get stronger and longer. °· Contractions may last 60-90 seconds and come less than 2 minutes apart. °· You may feel hot flashes, chills, or nausea. °How does this affect my baby? °During the first stage of labor, your baby will   gradually move down into your birth canal. °Follow these instructions at home and in the hospital or birth center: ° °· When labor first begins, try to stay calm. You are still in the early phase. If it is night, try to get some sleep. If it is day, try to relax and save your energy. You may want to make some calls and get ready to go to the hospital or birth center. °· When you are in the early phase, try these methods to help ease discomfort: °? Deep breathing and  muscle relaxation. °? Taking a walk. °? Taking a warm bath or shower. °· Drink some fluids and have a light snack if you feel like it. °· Keep track of your contractions. °· Based on the plan you created with your birth care provider, call when your contractions indicate it is time. °· If your water breaks, note the time, color, and odor of the fluid. °· When you are in the active phase, do your breathing exercises and rely on your support people and your team of birth care providers. °Contact a health care provider if: °· Your contractions are strong and regular. °· You have lower back pain or cramping. °· Your water breaks. °· You lose your mucus plug. °Get help right away if you: °· Have a severe headache that does not go away. °· Have changes in your vision. °· Have severe pain in your upper belly. °· Do not feel the baby move. °· Have bright red bleeding. °Summary °· The first stage of labor starts when true labor begins, and it ends when your cervix is dilated to 10 cm. °· The first stage of labor has three phases: early, active, and transitional. °· Your baby moves into the birth canal during the first stage of labor. °· You may have contractions that become stronger and longer. You may also lose your mucus plug and have your water break. °· Call your birth care provider when your contractions are frequent and strong enough to go to the hospital or birth center. °This information is not intended to replace advice given to you by your health care provider. Make sure you discuss any questions you have with your health care provider. °Document Revised: 04/28/2018 Document Reviewed: 03/21/2017 °Elsevier Patient Education © 2020 Else[employer] Inc. ° °

## 2019-09-14 NOTE — OB Triage Note (Signed)
Pt arrived to department today for possible leaking of fluid. Pt states she woke up with a small amount of clear fluid in her underwear and that she has now had several times today with irregular contractions. Pt states she had an appointment earlier but did not think to mention it. Patient states baby is moving well. Denies bleeding or any other conc erns,

## 2019-09-14 NOTE — Progress Notes (Signed)
ROB: Patient complains of daily contractions but not "regular".  Says that she is "tired of being pregnant".  Discussed biophysical profile and NST next week, patient would like to be induced as soon as possible.

## 2019-09-15 ENCOUNTER — Inpatient Hospital Stay: Payer: BC Managed Care – PPO | Admitting: Anesthesiology

## 2019-09-15 ENCOUNTER — Encounter: Payer: Self-pay | Admitting: Obstetrics and Gynecology

## 2019-09-15 ENCOUNTER — Other Ambulatory Visit: Payer: Self-pay

## 2019-09-15 ENCOUNTER — Inpatient Hospital Stay
Admission: EM | Admit: 2019-09-15 | Discharge: 2019-09-17 | DRG: 807 | Disposition: A | Discharge status: Home / Self Care | Payer: BC Managed Care – PPO | Attending: Obstetrics and Gynecology | Admitting: Obstetrics and Gynecology

## 2019-09-15 ENCOUNTER — Telehealth: Payer: Self-pay | Admitting: Obstetrics and Gynecology

## 2019-09-15 DIAGNOSIS — F32A Depression, unspecified: Secondary | ICD-10-CM | POA: Diagnosis present

## 2019-09-15 DIAGNOSIS — Z3490 Encounter for supervision of normal pregnancy, unspecified, unspecified trimester: Secondary | ICD-10-CM

## 2019-09-15 DIAGNOSIS — O26893 Other specified pregnancy related conditions, third trimester: Secondary | ICD-10-CM | POA: Diagnosis present

## 2019-09-15 DIAGNOSIS — Z23 Encounter for immunization: Secondary | ICD-10-CM

## 2019-09-15 DIAGNOSIS — Z8616 Personal history of COVID-19: Secondary | ICD-10-CM

## 2019-09-15 DIAGNOSIS — F419 Anxiety disorder, unspecified: Secondary | ICD-10-CM

## 2019-09-15 DIAGNOSIS — F329 Major depressive disorder, single episode, unspecified: Secondary | ICD-10-CM | POA: Diagnosis present

## 2019-09-15 DIAGNOSIS — Z3A39 39 weeks gestation of pregnancy: Secondary | ICD-10-CM | POA: Diagnosis not present

## 2019-09-15 DIAGNOSIS — Z20822 Contact with and (suspected) exposure to covid-19: Secondary | ICD-10-CM | POA: Diagnosis present

## 2019-09-15 DIAGNOSIS — O99344 Other mental disorders complicating childbirth: Secondary | ICD-10-CM | POA: Diagnosis present

## 2019-09-15 DIAGNOSIS — O09899 Supervision of other high risk pregnancies, unspecified trimester: Secondary | ICD-10-CM

## 2019-09-15 LAB — CBC
HCT: 34.4 % — ABNORMAL LOW (ref 36.0–46.0)
Hemoglobin: 10.6 g/dL — ABNORMAL LOW (ref 12.0–15.0)
MCH: 23 pg — ABNORMAL LOW (ref 26.0–34.0)
MCHC: 30.8 g/dL (ref 30.0–36.0)
MCV: 74.6 fL — ABNORMAL LOW (ref 80.0–100.0)
Platelets: 358 10*3/uL (ref 150–400)
RBC: 4.61 MIL/uL (ref 3.87–5.11)
RDW: 15.1 % (ref 11.5–15.5)
WBC: 12.6 10*3/uL — ABNORMAL HIGH (ref 4.0–10.5)
nRBC: 0 % (ref 0.0–0.2)

## 2019-09-15 LAB — RUPTURE OF MEMBRANE (ROM)PLUS: Rom Plus: POSITIVE

## 2019-09-15 LAB — SARS CORONAVIRUS 2 BY RT PCR (HOSPITAL ORDER, PERFORMED IN ~~LOC~~ HOSPITAL LAB): SARS Coronavirus 2: NEGATIVE

## 2019-09-15 MED ORDER — LIDOCAINE HCL (PF) 1 % IJ SOLN: 30.0000 mL | INTRAMUSCULAR | Status: DC | PRN

## 2019-09-15 MED ORDER — FENTANYL 2.5 MCG/ML W/ROPIVACAINE 0.15% IN NS 100 ML EPIDURAL (ARMC)
12.0000 mL/h | EPIDURAL | Status: DC
  Administered 2019-09-15: 12 mL/h via EPIDURAL
  Filled 2019-09-15: qty 100

## 2019-09-15 MED ORDER — CITALOPRAM HYDROBROMIDE 20 MG PO TABS
20.0000 mg | ORAL_TABLET | Freq: Every day | ORAL | Status: DC
  Administered 2019-09-16 – 2019-09-17 (×2): 20 mg via ORAL
  Filled 2019-09-15 (×2): qty 1

## 2019-09-15 MED ORDER — SODIUM CHLORIDE 0.9 % IV SOLN
INTRAVENOUS | Status: DC | PRN
  Administered 2019-09-15 (×2): 5 mL via EPIDURAL

## 2019-09-15 MED ORDER — DIPHENHYDRAMINE HCL 50 MG/ML IJ SOLN: 12.5000 mg | INTRAMUSCULAR | Status: DC | PRN

## 2019-09-15 MED ORDER — LIDOCAINE HCL (PF) 1 % IJ SOLN
INTRAMUSCULAR | Status: AC
  Filled 2019-09-15: qty 30

## 2019-09-15 MED ORDER — TERBUTALINE SULFATE 1 MG/ML IJ SOLN: 0.2500 mg | Freq: Once | INTRAMUSCULAR | Status: DC | PRN

## 2019-09-15 MED ORDER — BUTORPHANOL TARTRATE 1 MG/ML IJ SOLN
1.0000 mg | INTRAMUSCULAR | Status: DC | PRN
  Administered 2019-09-15: 1 mg via INTRAVENOUS
  Filled 2019-09-15: qty 1

## 2019-09-15 MED ORDER — ACETAMINOPHEN 325 MG PO TABS: 650.0000 mg | ORAL_TABLET | ORAL | Status: DC | PRN

## 2019-09-15 MED ORDER — TERBUTALINE SULFATE 1 MG/ML IJ SOLN
INTRAMUSCULAR | Status: AC
  Filled 2019-09-15: qty 1

## 2019-09-15 MED ORDER — MISOPROSTOL 100 MCG PO TABS
50.0000 ug | ORAL_TABLET | ORAL | Status: DC | PRN
  Filled 2019-09-15: qty 1

## 2019-09-15 MED ORDER — PHENYLEPHRINE 40 MCG/ML (10ML) SYRINGE FOR IV PUSH (FOR BLOOD PRESSURE SUPPORT)
80.0000 ug | PREFILLED_SYRINGE | INTRAVENOUS | Status: DC | PRN
  Filled 2019-09-15: qty 10

## 2019-09-15 MED ORDER — EPHEDRINE 5 MG/ML INJ
10.0000 mg | INTRAVENOUS | Status: DC | PRN
  Filled 2019-09-15: qty 2

## 2019-09-15 MED ORDER — ONDANSETRON HCL 4 MG/2ML IJ SOLN: 4.0000 mg | Freq: Four times a day (QID) | INTRAMUSCULAR | Status: DC | PRN

## 2019-09-15 MED ORDER — OXYTOCIN 10 UNIT/ML IJ SOLN
INTRAMUSCULAR | Status: AC
  Filled 2019-09-15: qty 2

## 2019-09-15 MED ORDER — LACTATED RINGERS IV SOLN: INTRAVENOUS | Status: DC

## 2019-09-15 MED ORDER — MISOPROSTOL 200 MCG PO TABS
ORAL_TABLET | ORAL | Status: AC
  Filled 2019-09-15: qty 4

## 2019-09-15 MED ORDER — OXYTOCIN-SODIUM CHLORIDE 30-0.9 UT/500ML-% IV SOLN
2.5000 [IU]/h | INTRAVENOUS | Status: DC
  Filled 2019-09-15: qty 1000

## 2019-09-15 MED ORDER — OXYTOCIN BOLUS FROM INFUSION
333.0000 mL | Freq: Once | INTRAVENOUS | Status: AC
  Administered 2019-09-15: 333 mL via INTRAVENOUS

## 2019-09-15 MED ORDER — SOD CITRATE-CITRIC ACID 500-334 MG/5ML PO SOLN: 30.0000 mL | ORAL | Status: DC | PRN

## 2019-09-15 MED ORDER — LIDOCAINE HCL (PF) 1 % IJ SOLN
INTRAMUSCULAR | Status: DC | PRN
  Administered 2019-09-15: 3 mL via SUBCUTANEOUS

## 2019-09-15 MED ORDER — IBUPROFEN 600 MG PO TABS
600.0000 mg | ORAL_TABLET | Freq: Four times a day (QID) | ORAL | Status: DC
  Administered 2019-09-15 – 2019-09-16 (×4): 600 mg via ORAL
  Filled 2019-09-15 (×4): qty 1

## 2019-09-15 MED ORDER — AMMONIA AROMATIC IN INHA
RESPIRATORY_TRACT | Status: AC
  Filled 2019-09-15: qty 10

## 2019-09-15 MED ORDER — BENZOCAINE-MENTHOL 20-0.5 % EX AERO
1.0000 "application " | INHALATION_SPRAY | CUTANEOUS | Status: DC | PRN
  Administered 2019-09-15: 1 via TOPICAL
  Filled 2019-09-15: qty 56

## 2019-09-15 MED ORDER — LACTATED RINGERS IV SOLN
500.0000 mL | Freq: Once | INTRAVENOUS | Status: AC
  Administered 2019-09-15: 500 mL via INTRAVENOUS

## 2019-09-15 MED ORDER — LIDOCAINE-EPINEPHRINE (PF) 1.5 %-1:200000 IJ SOLN
INTRAMUSCULAR | Status: DC | PRN
  Administered 2019-09-15: 3 mL via EPIDURAL

## 2019-09-15 MED ORDER — LACTATED RINGERS IV SOLN
500.0000 mL | INTRAVENOUS | Status: DC | PRN
  Administered 2019-09-15: 500 mL via INTRAVENOUS

## 2019-09-15 MED ORDER — ACETAMINOPHEN 325 MG PO TABS
650.0000 mg | ORAL_TABLET | ORAL | Status: DC | PRN
  Administered 2019-09-15 – 2019-09-16 (×2): 650 mg via ORAL
  Filled 2019-09-15 (×2): qty 2

## 2019-09-15 NOTE — Telephone Encounter (Signed)
Spoke with patient and she woke up from nap having pain and heard a pop. Patient states she is leaking brownish green fluid. Patient could not tell me how far apart her contractions are. She states that it is constant pain. I advised patient to go to labor and delivery. I have let Dr. Valentino Saxon know what is going on.

## 2019-09-15 NOTE — Anesthesia Preprocedure Evaluation (Signed)
Anesthesia Evaluation  Patient identified by MRN, date of birth, ID band Patient awake    Reviewed: Allergy & Precautions, H&P , NPO status , Patient's Chart, lab work & pertinent test results  History of Anesthesia Complications Negative for: history of anesthetic complications  Airway Mallampati: III  TM Distance: >3 FB     Dental  (+) Teeth Intact   Pulmonary neg pulmonary ROS,           Cardiovascular Exercise Tolerance: Good (-) hypertension(-) angina(-) dysrhythmias + Valvular Problems/Murmurs      Neuro/Psych PSYCHIATRIC DISORDERS Anxiety Depression negative neurological ROS     GI/Hepatic GERD  ,  Endo/Other    Renal/GU   negative genitourinary   Musculoskeletal   Abdominal   Peds  Hematology negative hematology ROS (+)   Anesthesia Other Findings Past Medical History: No date: Abnormal liver enzymes No date: Anxiety No date: Depression No date: Dysmenorrhea No date: GERD (gastroesophageal reflux disease)  Past Surgical History: 2014: BREAST SURGERY; Bilateral     Comment:  Breast Reduction 08/10/14: LIVER BIOPSY  BMI    Body Mass Index:  31.97 kg/m      Reproductive/Obstetrics (+) Pregnancy                             Anesthesia Physical  Anesthesia Plan  ASA: II  Anesthesia Plan: Epidural   Post-op Pain Management:    Induction:   PONV Risk Score and Plan:   Airway Management Planned:   Additional Equipment:   Intra-op Plan:   Post-operative Plan:   Informed Consent: I have reviewed the patients History and Physical, chart, labs and discussed the procedure including the risks, benefits and alternatives for the proposed anesthesia with the patient or authorized representative who has indicated his/her understanding and acceptance.       Plan Discussed with: Anesthesiologist  Anesthesia Plan Comments:         Anesthesia Quick  Evaluation

## 2019-09-15 NOTE — Discharge Summary (Signed)
    L&D OB Triage Note  SUBJECTIVE Rose Howard is a 24 y.o. G62P1001 female at [redacted]w[redacted]d, EDD Estimated Date of Delivery: 09/19/19 who presented to triage with complaints of contractions and leakage of fluid.  Denies bleeding.  Reports fetal movement.  OB History  Gravida Para Term Preterm AB Living  2 1 1  0 0 1  SAB TAB Ectopic Multiple Live Births  0 0 0 0 1    # Outcome Date GA Lbr Len/2nd Weight Sex Delivery Anes PTL Lv  2 Current           1 Term 12/08/17 [redacted]w[redacted]d / 00:54 3200 g M Vag-Vacuum EPI  LIV     Name: M      Apgar1: 8  Apgar5: 9    No medications prior to admission.     OBJECTIVE  Nursing Evaluation:   BP 119/77   Pulse 86   Temp 98.4 F (36.9 C) (Oral)   LMP 12/13/2018    Findings:        ROM plus  = negative     Irregular contractions not causing cervical change      NST was performed and has been reviewed by me.  NST INTERPRETATION: Category I  Mode: External Baseline Rate (A): 120 bpm Variability: Moderate Accelerations: 15 x 15 Decelerations: None     Contraction Frequency (min): occas  ASSESSMENT Impression:  1.  Pregnancy:  G2P1001 at [redacted]w[redacted]d , EDD Estimated Date of Delivery: 09/19/19 2.  Reassuring fetal and maternal status 3.  No evidence of rupture of membranes-patient not in labor  PLAN 1. Discussed current condition and above findings with patient and reassurance given.  All questions answered. 2. Discharge home with standard labor precautions given to return to L&D or call the office for problems. 3. Continue routine prenatal care.

## 2019-09-15 NOTE — Telephone Encounter (Signed)
Patient called in stating that she was taking a nap and heard a "pop" come from within her body. Patient states she is having some brown vaginal discharge and is having contractions. Informed patient I would get a  Nurse on the phone. Rose Howard then took over the phone call.

## 2019-09-15 NOTE — H&P (Signed)
Obstetric History and Physical  Rose Howard is a 24 y.o. G2P1001 with IUP at [redacted]w[redacted]d presenting for possible ruptured membranes and contractions.  Patient reports hearing a "pop" and feeling a small gush of fluid . Patient states she has been having  Irregular contractions,  No vaginal bleeding, ruptured, meconium stained membranes, with active fetal movement.    Prenatal Course Source of Care: [facility] with onset of care at 7 weeks  Pregnancy complications or risks: Patient Active Problem List   Diagnosis Date Noted  . Term pregnancy 09/15/2019  . Pregnancy 09/14/2019  . Amniotic fluid index increased 07/26/2019  . History of liver biopsy 02/05/2019  . 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 Nonallopathic lesion of sacral region 07/25/2018  . Nonallopathic lesion of thoracic region 07/25/2018  . Nonallopathic lesion of lumbosacral region 07/25/2018  . Elevated LFTs 11/03/2016  . Bloating 11/03/2016  . Abnormal liver enzymes 07/31/2014  . Acid reflux 07/31/2014  . 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  . Cardiac murmur 07/31/2014  . Avitaminosis D 07/31/2014  . HLD (hyperlipidemia) 07/31/2014  . Elevated carcinoembryonic antigen 07/31/2014  . Anxiety and depression 07/12/2014  . Dysmenorrhea 07/12/2014  . Contraception management 07/12/2014   She plans to breastfeed She desires IUD for postpartum contraception.   Prenatal labs and studies: ABO, Rh: O/Positive/-- (01/27 1430) Antibody: Comment, See Final Results (01/27 1430) Rubella: 0.95 (01/27 1430) RPR: Non Reactive (06/11 1059)  HBsAg: Negative (01/27 1430)  HIV: Non Reactive (01/27 1430)  HBZ:JIRCVELF/-- (08/04 1634) 1 hr Glucola  normal Genetic screening normal Anatomy US normal   Past Medical History:  Diagnosis Date  . Abnormal liver enzymes   . Anxiety   . Depression   . Dysmenorrhea   . GERD (gastroesophageal reflux disease)   . History of COVID-19     Past Surgical History:  Procedure  Laterality Date  . BREAST SURGERY Bilateral 2014   Breast Reduction  . LIVER BIOPSY  08/10/14    OB History  Gravida Para Term Preterm AB Living  2 1 1  0 0 1  SAB TAB Ectopic Multiple Live Births  0 0 0 0 1    # Outcome Date GA Lbr Len/2nd Weight Sex Delivery Anes PTL Lv  2 Current           1 Term 12/08/17 [redacted]w[redacted]d / 00:54 3200 g M Vag-Vacuum EPI  LIV    Social History   Socioeconomic History  . Marital status: Married    Spouse name: Not on file  . Number of children: Not on file  . Years of education: Not on file  . Highest education level: Not on file  Occupational History  . Occupation: M  Tobacco Use  . Smoking status: Never Smoker  . Smokeless tobacco: Never Used  Vaping Use  . Vaping Use: Never used  Substance and Sexual Activity  . Alcohol use: Not Currently  . Drug use: Not Currently    Types: Marijuana  . Sexual activity: Yes    Birth control/protection: None    Comment: post delivery IUD  Other Topics Concern  . Not on file  Social History Narrative  . Not on file   Social Determinants of Health   Financial Resource Strain:   . Difficulty of Paying Living Expenses: Not on file  Food Insecurity:   . Worried About Designer, industrial/product in the Last Year: Not on file  . Ran Out of Food in the Last Year: Not on file  Transportation Needs:   . Freight forwarder (Medical): Not on file  . Lack of Transportation (Non-Medical): Not on file  Physical Activity:   . Days of Exercise per Week: Not on file  . Minutes of Exercise per Session: Not on file  Stress:   . Feeling of Stress : Not on file  Social Connections:   . Frequency of Communication with Friends and Family: Not on file  . Frequency of Social Gatherings with Friends and Family: Not on file  . Attends Religious Services: Not on file  . Active Member of Clubs or Organizations: Not on file  . Attends Banker Meetings: Not on file  . Marital Status: Not on file    Family  History  Problem Relation Age of Onset  . Heart disease Father   . Alcohol abuse Father   . Hypercholesterolemia Mother   . Breast cancer Neg Hx   . Ovarian cancer Neg Hx   . Colon cancer Neg Hx     Medications Prior to Admission  Medication Sig Dispense Refill Last Dose  . acetaminophen (TYLENOL) 500 MG tablet Take 500 mg by mouth as needed.   Past Week at Unknown time  . albuterol (VENTOLIN HFA) 108 (90 Base) MCG/ACT inhaler Inhale 2 puffs into the lungs every 6 (six) hours as needed for wheezing or shortness of breath. 8 g 0 Past Month at Unknown time  . citalopram (CELEXA) 20 MG tablet Take 1 tablet (20 mg total) by mouth daily. 90 tablet 4 09/15/2019 at Unknown time  . Prenatal MV-Min-FA-Omega-3 (PRENATAL GUMMIES/DHA & FA PO) Take 2 each by mouth daily.   09/15/2019 at Unknown time    No Known Allergies  Review of Systems: Negative except for what is mentioned in HPI.  Physical Exam: BP 126/78 (BP Location: Left Arm)   Pulse 97   Temp 97.9 F (36.6 C) (Oral)   Resp 16   Ht 5\' 2"  (1.575 m)   Wt 81.6 kg   LMP 12/13/2018   BMI 32.92 kg/m  CONSTITUTIONAL: Well-developed, well-nourished female in no acute distress.  HENT:  Normocephalic, atraumatic, External right and left ear normal. Oropharynx is clear and moist EYES: Conjunctivae and EOM are normal. Pupils are equal, round, and reactive to light. No scleral icterus.  NECK: Normal range of motion, supple, no masses SKIN: Skin is warm and dry. No rash noted. Not diaphoretic. No erythema. No pallor. NEUROLOGIC: Alert and oriented to person, place, and time. Normal reflexes, muscle tone coordination. No cranial nerve deficit noted. PSYCHIATRIC: Normal mood and affect. Normal behavior. Normal judgment and thought content. CARDIOVASCULAR: Normal heart rate noted, regular rhythm RESPIRATORY: Effort and breath sounds normal, no problems with respiration noted ABDOMEN: Soft, nontender, nondistended, gravid. MUSCULOSKELETAL: Normal  range of motion. No edema and no tenderness. 2+ distal pulses.  Cervical Exam: Dilatation 4 cm   Effacement 90%   Station -2   Presentation: cephalic FHT:  Baseline rate 115 bpm   Variability moderate  Accelerations present   Decelerations none Contractions: Every 2-4 mins   Pertinent Labs/Studies:   Results for orders placed or performed during the hospital encounter of 09/15/19 (from the past 24 hour(s))  ROM Plus ([facility] only)     Status: None   Collection Time: 09/15/19  4:18 PM  Result Value Ref Range   Rom Plus POSITIVE     Assessment : MANE CONSOLO is a 24 y.o. G2P1001 at [redacted]w[redacted]d being admitted for latent labor with SROM. History  of depression in pregnancy on Celexa. Rubella non-immune  Plan: Labor: Expectant management. Augmentation as ordered as per protocol. Analgesia as needed. Admission labs ordered. Can have epidural if desired. Maternal vaccination for rubella non-immune status postpartum.   FWB: Reassuring fetal heart tracing.  GBS negative.  Delivery plan: Hopeful for vaginal delivery. Monitor fetal transitioning due to use of antidepressants during pregnancy.    Hildred Laser, MD [facility]

## 2019-09-15 NOTE — Anesthesia Procedure Notes (Signed)
Epidural Patient location during procedure: OB Start time: 09/15/2019 6:34 PM End time: 09/15/2019 6:37 PM  Staffing Anesthesiologist: Lenard Simmer, MD Performed: anesthesiologist   Preanesthetic Checklist Completed: patient identified, IV checked, site marked, risks and benefits discussed, surgical consent, monitors and equipment checked, pre-op evaluation and timeout performed  Epidural Patient position: sitting Prep: ChloraPrep Patient monitoring: heart rate, continuous pulse ox and blood pressure Approach: midline Location: L3-L4 Injection technique: LOR saline  Needle:  Needle type: Tuohy  Needle gauge: 17 G Needle length: 9 cm and 9 Needle insertion depth: 5.5 cm Catheter type: closed end flexible Catheter size: 19 Gauge Catheter at skin depth: 10.5 cm Test dose: negative and 1.5% lidocaine with Epi 1:200 K  Assessment Sensory level: T10 Events: blood not aspirated, injection not painful, no injection resistance, no paresthesia and negative IV test  Additional Notes 1st attempt Pt. Evaluated and documentation done after procedure finished. Patient identified. Risks/Benefits/Options discussed with patient including but not limited to bleeding, infection, nerve damage, paralysis, failed block, incomplete pain control, headache, blood pressure changes, nausea, vomiting, reactions to medication both or allergic, itching and postpartum back pain. Confirmed with bedside nurse the patient's most recent platelet count. Confirmed with patient that they are not currently taking any anticoagulation, have any bleeding history or any family history of bleeding disorders. Patient expressed understanding and wished to proceed. All questions were answered. Sterile technique was used throughout the entire procedure. Please see nursing notes for vital signs. Test dose was given through epidural catheter and negative prior to continuing to dose epidural or start infusion. Warning signs of  high block given to the patient including shortness of breath, tingling/numbness in hands, complete motor block, or any concerning symptoms with instructions to call for help. Patient was given instructions on fall risk and not to get out of bed. All questions and concerns addressed with instructions to call with any issues or inadequate analgesia.   Patient tolerated the insertion well without immediate complications.Reason for block:procedure for pain

## 2019-09-15 NOTE — OB Triage Note (Signed)
Patient G2P1 [redacted]w[redacted]d presents to L&D with complaints of LOF since 1500. Patient states she woke up from a nap to a "pop". She reports the fluid being green/brown. She reports contractions that began after that time. Patient reports + FM. Monitors applied and assessing. Vital signs stable.

## 2019-09-16 MED ORDER — DIPHENHYDRAMINE HCL 25 MG PO CAPS: 25.0000 mg | ORAL_CAPSULE | Freq: Four times a day (QID) | ORAL | Status: DC | PRN

## 2019-09-16 MED ORDER — DOCUSATE SODIUM 100 MG PO CAPS
100.0000 mg | ORAL_CAPSULE | Freq: Two times a day (BID) | ORAL | Status: DC
  Administered 2019-09-16 – 2019-09-17 (×3): 100 mg via ORAL
  Filled 2019-09-16 (×3): qty 1

## 2019-09-16 MED ORDER — ZOLPIDEM TARTRATE 5 MG PO TABS: 5.0000 mg | ORAL_TABLET | Freq: Every evening | ORAL | Status: DC | PRN

## 2019-09-16 MED ORDER — OXYCODONE-ACETAMINOPHEN 5-325 MG PO TABS: 1.0000 | ORAL_TABLET | ORAL | Status: DC | PRN

## 2019-09-16 MED ORDER — TETANUS-DIPHTH-ACELL PERTUSSIS 5-2.5-18.5 LF-MCG/0.5 IM SUSP: 0.5000 mL | Freq: Once | INTRAMUSCULAR | Status: DC

## 2019-09-16 MED ORDER — SIMETHICONE 80 MG PO CHEW: 80.0000 mg | CHEWABLE_TABLET | ORAL | Status: DC | PRN

## 2019-09-16 MED ORDER — PRENATAL MULTIVITAMIN CH
1.0000 | ORAL_TABLET | Freq: Every day | ORAL | Status: DC
  Administered 2019-09-16 – 2019-09-17 (×2): 1 via ORAL
  Filled 2019-09-16 (×2): qty 1

## 2019-09-16 MED ORDER — OXYTOCIN-SODIUM CHLORIDE 30-0.9 UT/500ML-% IV SOLN
2.5000 [IU]/h | INTRAVENOUS | Status: DC | PRN
  Filled 2019-09-16: qty 500

## 2019-09-16 MED ORDER — COCONUT OIL OIL
1.0000 "application " | TOPICAL_OIL | Status: DC | PRN
  Administered 2019-09-16: 1 via TOPICAL
  Filled 2019-09-16: qty 120

## 2019-09-16 NOTE — Anesthesia Postprocedure Evaluation (Signed)
Anesthesia Post Note  Patient: Rose Howard  Procedure(s) Performed: AN AD HOC LABOR EPIDURAL  Patient location during evaluation: Mother Baby Anesthesia Type: Epidural Level of consciousness: awake and alert Pain management: pain level controlled Vital Signs Assessment: post-procedure vital signs reviewed and stable Respiratory status: spontaneous breathing, nonlabored ventilation and respiratory function stable Cardiovascular status: stable Postop Assessment: no headache, no backache and epidural receding Anesthetic complications: no   No complications documented.   Last Vitals:  Vitals:   09/16/19 0403 09/16/19 0830  BP: 122/81 114/85  Pulse: (!) 57 69  Resp: 18 17  Temp: (!) 36.4 C 36.6 C  SpO2: 99% 100%    Last Pain:  Vitals:   09/16/19 0410  TempSrc:   PainSc: 0-No pain                 Corinda Gubler

## 2019-09-16 NOTE — Progress Notes (Signed)
Post Partum Day # 1, s/p SVD  Subjective: no complaints, up ad lib, voiding and tolerating PO  Objective: Temp:  [97.5 F (36.4 C)-98.5 F (36.9 C)] 97.9 F (36.6 C) (08/28 0830) Pulse Rate:  [57-131] 69 (08/28 0830) Resp:  [16-18] 17 (08/28 0830) BP: (96-142)/(63-86) 114/85 (08/28 0830) SpO2:  [97 %-100 %] 100 % (08/28 0830) Weight:  [81.6 kg] 81.6 kg (08/27 1550)  Physical Exam:  General: alert and no distress  Lungs: clear to auscultation bilaterally Breasts: normal appearance, no masses or tenderness Heart: regular rate and rhythm, S1, S2 normal, no murmur, click, rub or gallop Abdomen: soft, non-tender; bowel sounds normal; no masses,  no organomegaly Pelvis: Lochia: appropriate, Uterine Fundus: firm Extremities: DVT Evaluation: No evidence of DVT seen on physical exam. Negative Homan's sign. No cords or calf tenderness. No significant calf/ankle edema.  Recent Labs    09/15/19 1609  HGB 10.6*  HCT 34.4*    Assessment/Plan: Doing well postpartum Breastfeeding, Lactation consult  Contraception IUD Plan for discharge tomorrow    LOS: 1 day   Hildred Laser, MD [facility] 09/16/2019 9:00 AM

## 2019-09-17 DIAGNOSIS — O09899 Supervision of other high risk pregnancies, unspecified trimester: Secondary | ICD-10-CM

## 2019-09-17 LAB — RPR: RPR Ser Ql: NONREACTIVE

## 2019-09-17 MED ORDER — MEASLES, MUMPS & RUBELLA VAC IJ SOLR
0.5000 mL | Freq: Once | INTRAMUSCULAR | Status: AC
  Administered 2019-09-17: 0.5 mL via SUBCUTANEOUS
  Filled 2019-09-17: qty 0.5

## 2019-09-17 MED ORDER — IBUPROFEN 600 MG PO TABS
600.0000 mg | ORAL_TABLET | Freq: Four times a day (QID) | ORAL | Status: DC
  Administered 2019-09-17 (×2): 600 mg via ORAL
  Filled 2019-09-17 (×2): qty 1

## 2019-09-17 MED ORDER — IBUPROFEN 600 MG PO TABS
600.0000 mg | ORAL_TABLET | Freq: Four times a day (QID) | ORAL | 0 refills | Status: DC
Start: 2019-09-17 — End: 2019-10-27

## 2019-09-17 NOTE — Discharge Summary (Signed)
Postpartum Discharge Summary     Patient Name: Rose Howard DOB: 02-Jun-1995 MRN: [id number]  Date of admission: 09/15/2019 Delivery date:09/15/2019  Delivering provider: Harlin Heys  Date of discharge: 09/17/2019  Admitting diagnosis: Term pregnancy [Z34.90] Intrauterine pregnancy: [redacted]w[redacted]d    Secondary diagnosis:  Active Problems:   Anxiety and depression   Term pregnancy   Rubella non-immune status, antepartum  Additional problems: None    Discharge diagnosis: Term Pregnancy Delivered                                              Post partum procedures:None Augmentation: N/A Complications: None  Hospital course: Onset of Labor With Vaginal Delivery      24y.o. yo [id number] admitted in Active Labor on 09/15/2019. Patient had a labor course complicated by mild shoulder dystocia.  The remainder of her course was as follows:  Membrane Rupture Time/Date: 3:00 PM ,09/15/2019   Delivery Method:Vaginal, Spontaneous  Episiotomy: None  Lacerations:  None  Patient had an uncomplicated postpartum course.  She is ambulating, tolerating a regular diet, passing flatus, and urinating well. Patient is discharged home in stable condition on 09/17/19.  Newborn Data: Birth date:09/15/2019  Birth time:8:52 PM  Gender:Female  Living status:Living  Apgars:8 ,9  Weight:   Magnesium Sulfate received: No BMZ received: No Rhophylac:No MMR:Yes T-DaP:Given prenatally Flu: No Transfusion:No  Physical exam  Vitals:   09/16/19 0830 09/16/19 1229 09/16/19 2315 09/17/19 0827  BP: 114/85 122/77 114/81 119/78  Pulse: 69 78 91 68  Resp: 17 18 18 18   Temp: 97.9 F (36.6 C) 97.8 F (36.6 C) 98.8 F (37.1 C) 98.7 F (37.1 C)  TempSrc:  Oral Oral Oral  SpO2: 100%  96% 99%  Weight:      Height:       General: alert, cooperative and no distress Lochia: appropriate Uterine Fundus: firm Incision: N/A DVT Evaluation: No evidence of DVT seen on physical exam. Negative  Homan's sign. No cords or calf tenderness. No significant calf/ankle edema. Labs: Lab Results  Component Value Date   WBC 12.6 (H) 09/15/2019   HGB 10.6 (L) 09/15/2019   HCT 34.4 (L) 09/15/2019   MCV 74.6 (L) 09/15/2019   PLT 358 09/15/2019   CMP Latest Ref Rng & Units 03/08/2019  Glucose 65 - 99 mg/dL -  BUN 6 - 20 mg/dL -  Creatinine 0.44 - 1.00 mg/dL -  Sodium 135 - 145 mmol/L -  Potassium 3.5 - 5.1 mmol/L -  Chloride 101 - 111 mmol/L -  CO2 22 - 32 mmol/L -  Calcium 8.9 - 10.3 mg/dL -  Total Protein 6.0 - 8.5 g/dL 6.2  Total Bilirubin 0.0 - 1.2 mg/dL <0.2  Alkaline Phos 39 - 117 IU/L 109  AST 0 - 40 IU/L 15  ALT 0 - 32 IU/L 34(H)   EdFlavia Shippercore: Edinburgh Postnatal Depression Scale Screening Tool 09/16/2019  I have been able to laugh and see the funny side of things. 0  I have looked forward with enjoyment to things. 1  I have blamed myself unnecessarily when things went wrong. 0  I have been anxious or worried for no good reason. 1  I have felt scared or panicky for no good reason. 0  Things have been getting on top of me. 1  I have been  so unhappy that I have had difficulty sleeping. 1  I have felt sad or miserable. 1  I have been so unhappy that I have been crying. 1  The thought of harming myself has occurred to me. 0  Edinburgh Postnatal Depression Scale Total 6      After visit meds:  Allergies as of 09/17/2019   No Known Allergies     Medication List    TAKE these medications   acetaminophen 500 MG tablet Commonly known as: TYLENOL Take 500 mg by mouth as needed.   albuterol 108 (90 Base) MCG/ACT inhaler Commonly known as: VENTOLIN HFA Inhale 2 puffs into the lungs every 6 (six) hours as needed for wheezing or shortness of breath.   citalopram 20 MG tablet Commonly known as: CeleXA Take 1 tablet (20 mg total) by mouth daily.   ibuprofen 600 MG tablet Commonly known as: ADVIL Take 1 tablet (600 mg total) by mouth every 6 (six) hours.    PRENATAL GUMMIES/DHA & FA PO Take 2 each by mouth daily.        Discharge home in stable condition Infant Feeding: Breast Infant Disposition:home with mother Discharge instruction: per After Visit Summary and Postpartum booklet. Activity: Advance as tolerated. Pelvic rest for 6 weeks.  Diet: routine diet Anticipated Birth Control: IUD Postpartum Appointment:6 weeks Additional Postpartum F/U: Postpartum Depression checkup Future Appointments:No future appointments. Follow up Visit:  Follow-up Information    Rubie Maid, MD Follow up today.   Specialties: Obstetrics and Gynecology, Radiology Why: 2 week postpartum mood check (televisit) 6 week postpartum check in office Contact information: [address] [place] [address] [phone]                   09/17/2019 Rubie Maid, MD  [facility]

## 2019-09-17 NOTE — Progress Notes (Signed)
Reviewed D/C instructions with pt and family. Pt verbalized understanding of teaching. Discharged to home via W/C. Pt to schedule f/u appt.  

## 2019-09-17 NOTE — Plan of Care (Signed)
Reviewed D/C instructions with pt and family. Pt verbalized understanding of teaching. Discharged to home via W/C. Pt to schedule f/u appt.  

## 2019-09-18 LAB — TYPE AND SCREEN
ABO/RH(D): O POS
Antibody Screen: POSITIVE
Unit division: 0
Unit division: 0

## 2019-09-18 LAB — BPAM RBC
Blood Product Expiration Date: 202109222359
Blood Product Expiration Date: 202109222359
ISSUE DATE / TIME: 202108301050
ISSUE DATE / TIME: 202108301153
Unit Type and Rh: 5100
Unit Type and Rh: 5100

## 2019-10-27 ENCOUNTER — Other Ambulatory Visit: Payer: Self-pay

## 2019-10-27 ENCOUNTER — Ambulatory Visit (INDEPENDENT_AMBULATORY_CARE_PROVIDER_SITE_OTHER): Payer: BC Managed Care – PPO | Admitting: Obstetrics and Gynecology

## 2019-10-27 ENCOUNTER — Encounter: Payer: Self-pay | Admitting: Obstetrics and Gynecology

## 2019-10-27 DIAGNOSIS — F53 Postpartum depression: Secondary | ICD-10-CM

## 2019-10-27 DIAGNOSIS — O99345 Other mental disorders complicating the puerperium: Secondary | ICD-10-CM

## 2019-10-27 NOTE — Progress Notes (Signed)
HPI:      [name] is a 24 y.o. [id number] who LMP was Patient's last menstrual period was 12/13/2018.  Subjective:   She presents today approximately 6 weeks postpartum.  She says she is doing well she is taking her Celexa.  She has excellent score today. She did discontinue breast-feeding and she is now bottlefeeding. She has not yet resumed intercourse. She has expressed her interest in IUD for birth control.    Hx: The following portions of the patient's history were reviewed and updated as appropriate:             She  has a past medical history of Abnormal liver enzymes, Anxiety, Depression, Dysmenorrhea, GERD (gastroesophageal reflux disease), and History of COVID-19. She does not have any pertinent problems on file. She  has a past surgical history that includes Breast surgery (Bilateral, 2014) and Liver biopsy (08/10/14). Her family history includes Alcohol abuse in her father; Heart disease in her father; Hypercholesterolemia in her mother. She  reports that she has never smoked. She has never used smokeless tobacco. She reports previous alcohol use. She reports previous drug use. Drug: Marijuana. She has a current medication list which includes the following prescription(s): citalopram and albuterol. She has No Known Allergies.       Review of Systems:  Review of Systems  Constitutional: Denied constitutional symptoms, night sweats, recent illness, fatigue, fever, insomnia and weight loss.  Eyes: Denied eye symptoms, eye pain, photophobia, vision change and visual disturbance.  Ears/Nose/Throat/Neck: Denied ear, nose, throat or neck symptoms, hearing loss, nasal discharge, sinus congestion and sore throat.  Cardiovascular: Denied cardiovascular symptoms, arrhythmia, chest pain/pressure, edema, exercise intolerance, orthopnea and palpitations.  Respiratory: Denied pulmonary symptoms, asthma, pleuritic pain, productive sputum, cough, dyspnea and wheezing.   Gastrointestinal: Denied, gastro-esophageal reflux, melena, nausea and vomiting.  Genitourinary: Denied genitourinary symptoms including symptomatic vaginal discharge, pelvic relaxation issues, and urinary complaints.  Musculoskeletal: Denied musculoskeletal symptoms, stiffness, swelling, muscle weakness and myalgia.  Dermatologic: Denied dermatology symptoms, rash and scar.  Neurologic: Denied neurology symptoms, dizziness, headache, neck pain and syncope.  Psychiatric: Denied psychiatric symptoms, anxiety and depression.  Endocrine: Denied endocrine symptoms including hot flashes and night sweats.   Meds:   Current Outpatient Medications on File Prior to Visit  Medication Sig Dispense Refill  . citalopram (CELEXA) 20 MG tablet Take 1 tablet (20 mg total) by mouth daily. 90 tablet 4  . albuterol (VENTOLIN HFA) 108 (90 Base) MCG/ACT inhaler Inhale 2 puffs into the lungs every 6 (six) hours as needed for wheezing or shortness of breath. 8 g 0   No current facility-administered medications on file prior to visit.       Upstream - 10/27/19 1001      Pregnancy Intention Screening   Does the patient want to become pregnant in the next year? No    Does the patient's partner want to become pregnant in the next year? No    Would the patient like to discuss contraceptive options today? Yes      Contraception Wrap Up   Current Method No Contraceptive Precautions    End Method IUD or IUS    Contraception Counseling Provided Yes          The pregnancy intention screening data noted above was reviewed. Potential methods of contraception were discussed. The patient elected to proceed with IUD or IUS.     Objective:     Vitals:   10/27/19 0958  BP: 97/63  Pulse: 64   Filed Weights   10/27/19 0958  Weight: 173 lb 4.8 oz (78.6 kg)              Physical examination   Pelvic:   Vulva: Normal appearance.  No lesions.  Vagina: No lesions or abnormalities noted.  Support: Normal  pelvic support.  Urethra No masses tenderness or scarring.  Meatus Normal size without lesions or prolapse.  Cervix: Normal appearance.  No lesions.  Anus: Normal exam.  No lesions.  Perineum: Normal exam.  No lesions.        Bimanual   Uterus: Normal size.  Non-tender.  Mobile.  AV.  Adnexae: No masses.  Non-tender to palpation.  Cul-de-sac: Negative for abnormality.     Assessment:    [id number] Patient Active Problem List   Diagnosis Date Noted  . Rubella non-immune status, antepartum 09/17/2019  . Term pregnancy 09/15/2019  . Pregnancy 09/14/2019  . Amniotic fluid index increased 07/26/2019  . History of liver biopsy 02/05/2019  . 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 Nonallopathic lesion of sacral region 07/25/2018  . Nonallopathic lesion of thoracic region 07/25/2018  . Nonallopathic lesion of lumbosacral region 07/25/2018  . Elevated LFTs 11/03/2016  . Bloating 11/03/2016  . Abnormal liver enzymes 07/31/2014  . Acid reflux 07/31/2014  . 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  . Cardiac murmur 07/31/2014  . Avitaminosis D 07/31/2014  . HLD (hyperlipidemia) 07/31/2014  . Elevated carcinoembryonic antigen 07/31/2014  . Anxiety and depression 07/12/2014  . Dysmenorrhea 07/12/2014  . Contraception management 07/12/2014     1. Postpartum care and examination immediately after delivery   2. Depression affecting pregnancy, postpartum     Patient doing well postpartum.  Patient doing very well on Celexa postpartum.   Plan:            1.  Patient may resume normal activities.  2.  To return for IUD placement with menses if possible within 2 weeks if desired. Orders No orders of the defined types were placed in this encounter.   No orders of the defined types were placed in this encounter.     F/U  Return for She is to call at the start of next menses.  Elonda Husky, M.D. 10/27/2019 10:34 AM

## 2019-11-28 ENCOUNTER — Other Ambulatory Visit: Payer: Self-pay

## 2019-11-28 ENCOUNTER — Encounter: Payer: Self-pay | Admitting: Obstetrics and Gynecology

## 2019-11-28 ENCOUNTER — Ambulatory Visit (INDEPENDENT_AMBULATORY_CARE_PROVIDER_SITE_OTHER): Payer: BC Managed Care – PPO | Admitting: Obstetrics and Gynecology

## 2019-11-28 VITALS — BP 95/65 | HR 70 | Ht 62.0 in | Wt 173.7 lb

## 2019-11-28 DIAGNOSIS — Z30430 Encounter for insertion of intrauterine contraceptive device: Secondary | ICD-10-CM | POA: Diagnosis not present

## 2019-11-28 LAB — POCT URINE PREGNANCY: Preg Test, Ur: NEGATIVE

## 2019-11-28 NOTE — Progress Notes (Signed)
Pt present for IUD insertion. Pt stated that she was doing well. UPT-NEG.  

## 2019-11-28 NOTE — Patient Instructions (Signed)
Intrauterine Device Insertion, Care After  This sheet gives you information about how to care for yourself after your procedure. Your health care provider may also give you more specific instructions. If you have problems or questions, contact your health care provider. What can I expect after the procedure? After the procedure, it is common to have:  Cramps and pain in the abdomen.  Light bleeding (spotting) or heavier bleeding that is like your menstrual period. This may last for up to a few days.  Lower back pain.  Dizziness.  Headaches.  Nausea. Follow these instructions at home:  Before resuming sexual activity, check to make sure that you can feel the IUD string(s). You should be able to feel the end of the string(s) below the opening of your cervix. If your IUD string is in place, you may resume sexual activity. ? If you had a hormonal IUD inserted more than 7 days after your most recent period started, you will need to use a backup method of birth control for 7 days after IUD insertion. Ask your health care provider whether this applies to you.  Continue to check that the IUD is still in place by feeling for the string(s) after every menstrual period, or once a month.  Take over-the-counter and prescription medicines only as told by your health care provider.  Do not drive or use heavy machinery while taking prescription pain medicine.  Keep all follow-up visits as told by your health care provider. This is important. Contact a health care provider if:  You have bleeding that is heavier or lasts longer than a normal menstrual cycle.  You have a fever.  You have cramps or abdominal pain that get worse or do not get better with medicine.  You develop abdominal pain that is new or is not in the same area of earlier cramping and pain.  You feel lightheaded or weak.  You have abnormal or bad-smelling discharge from your vagina.  You have pain during sexual  activity.  You have any of the following problems with your IUD string(s): ? The string bothers or hurts you or your sexual partner. ? You cannot feel the string. ? The string has gotten longer.  You can feel the IUD in your vagina.  You think you may be pregnant, or you miss your menstrual period.  You think you may have an STI (sexually transmitted infection). Get help right away if:  You have flu-like symptoms.  You have a fever and chills.  You can feel that your IUD has slipped out of place. Summary  After the procedure, it is common to have cramps and pain in the abdomen. It is also common to have light bleeding (spotting) or heavier bleeding that is like your menstrual period.  Continue to check that the IUD is still in place by feeling for the string(s) after every menstrual period, or once a month.  Keep all follow-up visits as told by your health care provider. This is important.  Contact your health care provider if you have problems with your IUD string(s), such as the string getting longer or bothering you or your sexual partner. This information is not intended to replace advice given to you by your health care provider. Make sure you discuss any questions you have with your health care provider. Document Revised: 12/18/2016 Document Reviewed: 11/27/2015 Elsevier Patient Education  2020 Elsevier Inc.  

## 2019-11-28 NOTE — Progress Notes (Signed)
   GYNECOLOGY OFFICE PROCEDURE NOTE  Rose SMET is a 24 y.o. [phone] here for [place] IUD insertion. No GYN concerns.  Last pap smear was on 12/2016 and was normal.  IUD Insertion Procedure Note Patient identified, informed consent performed, consent signed.   Discussed risks of irregular bleeding, cramping, infection, malpositioning or misplacement of the IUD outside the uterus which may require further procedure such as laparoscopy. Also discussed >99% contraception efficacy, increased risk of ectopic pregnancy with failure of method.   Emphasized that this did not protect against STIs, condoms recommended during all sexual encounters. Time out was performed.  Urine pregnancy test negative.  Speculum placed in the vagina.  Cervix visualized.  Cleaned with Betadine x 2.  Grasped anteriorly with a single tooth tenaculum.  Uterus sounded to 8.5 cm.  Liletta IUD placed per manufacturer's recommendations.  Strings trimmed to 3 cm. Tenaculum was removed, good hemostasis noted.  Patient tolerated procedure well.   Patient was given post-procedure instructions.  She was advised to have backup contraception for one week.  Patient was also asked to check IUD strings periodically and follow up in 4 weeks for IUD check.   Lot: [id number] Exp: 12/2022   Hildred Laser, MD [facility]

## 2019-12-26 ENCOUNTER — Encounter: Payer: BC Managed Care – PPO | Admitting: Obstetrics and Gynecology

## 2020-01-08 NOTE — Progress Notes (Signed)
Follow up for routine IUD string check. Pt denies any issues at this time.

## 2020-01-09 ENCOUNTER — Other Ambulatory Visit: Payer: Self-pay

## 2020-01-09 ENCOUNTER — Encounter: Payer: Self-pay | Admitting: Obstetrics and Gynecology

## 2020-01-09 ENCOUNTER — Ambulatory Visit (INDEPENDENT_AMBULATORY_CARE_PROVIDER_SITE_OTHER): Payer: BC Managed Care – PPO | Admitting: Obstetrics and Gynecology

## 2020-01-09 VITALS — BP 109/74 | HR 74 | Ht 62.0 in | Wt 168.2 lb

## 2020-01-09 DIAGNOSIS — Z30431 Encounter for routine checking of intrauterine contraceptive device: Secondary | ICD-10-CM

## 2020-01-09 NOTE — Progress Notes (Signed)
    GYNECOLOGY OFFICE ENCOUNTER NOTE History:  24 y.o. [id number] here today for today for IUD string check; Liletta  IUD was placed  11/28/2019. No complaints about the IUD, no concerning side effects.  The following portions of the patient's history were reviewed and updated as appropriate: allergies, current medications, past family history, past medical history, past social history, past surgical history and problem list. Last pap smear on 05/27/2017 was normal, negative HRHPV.  Review of Systems:  Pertinent items are noted in HPI.   Objective:  Physical Exam Blood pressure 109/74, pulse 74, height 5\' 2"  (1.575 m), weight 168 lb 3.2 oz (76.3 kg), unknown if currently breastfeeding. CONSTITUTIONAL: Well-developed, well-nourished female in no acute distress.  ABDOMEN: Soft, no distention noted.   PELVIC: Normal appearing external genitalia; normal appearing vaginal mucosa and cervix.  IUD strings visualized, about 3 cm in length outside cervix.  EXTREMITIES: extremities normal, atraumatic, no cyanosis or edema NEUROLOGIC: Grossly normal  Assessment & Plan:  Patient to keep IUD in place for up to seven years; can come in for removal if she desires pregnancy earlier or for any concerning side effects. Follow up in 6 months for annual exam.   , MD [facility]

## 2020-03-10 ENCOUNTER — Other Ambulatory Visit: Payer: Self-pay

## 2020-03-10 MED ORDER — CITALOPRAM HYDROBROMIDE 40 MG PO TABS: 40.0000 mg | ORAL_TABLET | Freq: Every day | ORAL | 1 refills | Status: DC

## 2020-03-18 ENCOUNTER — Other Ambulatory Visit: Payer: Self-pay | Admitting: Obstetrics and Gynecology

## 2020-09-15 ENCOUNTER — Other Ambulatory Visit: Payer: Self-pay | Admitting: Obstetrics and Gynecology

## 2020-09-16 NOTE — Telephone Encounter (Signed)
Patient needs to be scheduled for annual exam in December.

## 2020-09-19 NOTE — Telephone Encounter (Signed)
Completed pt scheduled for 01/21/2021 at 3pm

## 2020-12-02 ENCOUNTER — Other Ambulatory Visit: Payer: BC Managed Care – PPO

## 2021-01-21 ENCOUNTER — Other Ambulatory Visit (HOSPITAL_COMMUNITY)
Admission: RE | Admit: 2021-01-21 | Discharge: 2021-01-21 | Disposition: A | Discharge status: Home / Self Care | Payer: BC Managed Care – PPO | Source: Ambulatory Visit | Attending: Obstetrics and Gynecology | Admitting: Obstetrics and Gynecology

## 2021-01-21 ENCOUNTER — Other Ambulatory Visit: Payer: Self-pay

## 2021-01-21 ENCOUNTER — Encounter: Payer: Self-pay | Admitting: Obstetrics and Gynecology

## 2021-01-21 ENCOUNTER — Ambulatory Visit (INDEPENDENT_AMBULATORY_CARE_PROVIDER_SITE_OTHER): Payer: BC Managed Care – PPO | Admitting: Obstetrics and Gynecology

## 2021-01-21 VITALS — BP 102/65 | Ht 62.0 in | Wt 158.4 lb

## 2021-01-21 DIAGNOSIS — Z124 Encounter for screening for malignant neoplasm of cervix: Secondary | ICD-10-CM | POA: Diagnosis not present

## 2021-01-21 DIAGNOSIS — E663 Overweight: Secondary | ICD-10-CM | POA: Diagnosis not present

## 2021-01-21 DIAGNOSIS — Z975 Presence of (intrauterine) contraceptive device: Secondary | ICD-10-CM | POA: Diagnosis not present

## 2021-01-21 DIAGNOSIS — Z01419 Encounter for gynecological examination (general) (routine) without abnormal findings: Secondary | ICD-10-CM

## 2021-01-21 NOTE — Addendum Note (Signed)
Addended by: Lady Deutscher on: 01/21/2021 04:31 PM   Modules accepted: Orders

## 2021-01-21 NOTE — Addendum Note (Signed)
Addended by: Vernia Buff A on: 01/21/2021 04:25 PM   Modules accepted: Orders

## 2021-01-21 NOTE — Progress Notes (Signed)
GYNECOLOGY ANNUAL PHYSICAL EXAM PROGRESS NOTE  Subjective:    Rose Howard is a 26 y.o. [id number] female who presents for an annual exam. The patient has no complaints today. The patient is sexually active. The patient participates in regular exercise: no. Has the patient ever been transfused or tattooed?: no. The patient reports that there is not domestic violence in her life.    Menstrual History: Menarche age: 61 No LMP recorded. (Menstrual status: IUD).    Gynecologic History:  Contraception: Liletta IUD, inserted 11/28/2019 History of STI's: Denies Last Pap: 05/27/2017. Results were: normal.  Denies h/o abnormal pap smears. [facility] in [place] - Dr. Celene Squibb    [facility] - 01/21/21 1515       Pregnancy Intention Screening   Does the patient want to become pregnant in the next year? No    Does the patient's partner want to become pregnant in the next year? No    Would the patient like to discuss contraceptive options today? No      Contraception Wrap Up   Current Method IUD or IUS    End Method IUD or IUS    Contraception Counseling Provided No            The pregnancy intention screening data noted above was reviewed. Potential methods of contraception were discussed. The patient elected to proceed with IUD or IUS.    OB History  Gravida Para Term Preterm AB Living  2 2 2  0 0 2  SAB IAB Ectopic Multiple Live Births  0 0 0 0 2    # Outcome Date GA Lbr Len/2nd Weight Sex Delivery Anes PTL Lv  2 Term 09/15/19 [redacted]w[redacted]d / 00:25  F Vag-Spont EPI  LIV     Name: Ruland,GIRL Fumiye     Apgar1: 8  Apgar5: 9  1 Term 12/08/17 [redacted]w[redacted]d / 00:54 7 lb 0.9 oz (3.2 kg) M Vag-Vacuum EPI  LIV     Name: Alroy Dust      Apgar1: 8  Apgar5: 9    Past Medical History:  Diagnosis Date   Abnormal liver enzymes    Anxiety    Depression    Dysmenorrhea    GERD (gastroesophageal reflux disease)    History of COVID-19    Migraine     Past Surgical History:   Procedure Laterality Date   BREAST SURGERY Bilateral 2014   Breast Reduction   LIVER BIOPSY  08/10/14    Family History  Problem Relation Age of Onset   Heart disease Father    Alcohol abuse Father    Hypercholesterolemia Mother    Breast cancer Neg Hx    Ovarian cancer Neg Hx    Colon cancer Neg Hx     Social History   Socioeconomic History   Marital status: Married    Spouse name: Not on file   Number of children: Not on file   Years of education: Not on file   Highest education level: Not on file  Occupational History   Occupation: lab tech  Tobacco Use   Smoking status: Never   Smokeless tobacco: Never  Vaping Use   Vaping Use: Never used  Substance and Sexual Activity   Alcohol use: Not Currently   Drug use: Not Currently    Types: Marijuana   Sexual activity: Yes    Birth control/protection: None, I.U.D.    Comment: post delivery IUD  Other Topics Concern   Not on file  Social  History Narrative   Not on file   Social Determinants of Health   Financial Resource Strain: Not on file  Food Insecurity: Not on file  Transportation Needs: Not on file  Physical Activity: Not on file  Stress: Not on file  Social Connections: Not on file  Intimate Partner Violence: Not on file    Current Outpatient Medications on File Prior to Visit  Medication Sig Dispense Refill   albuterol (VENTOLIN HFA) 108 (90 Base) MCG/ACT inhaler Inhale 2 puffs into the lungs every 6 (six) hours as needed for wheezing or shortness of breath. 8 g 0   citalopram (CELEXA) 40 MG tablet TAKE 1 TABLET BY MOUTH EVERY DAY 90 tablet 1   levonorgestrel (LILETTA, 52 MG,) 19.5 MCG/DAY IUD IUD 1 each by Intrauterine route once. Inserted 11/28/2019     No current facility-administered medications on file prior to visit.    No Known Allergies   Review of Systems Constitutional: negative for chills, fatigue, fevers and sweats Eyes: negative for irritation, redness and visual disturbance Ears,  nose, mouth, throat, and face: negative for hearing loss, nasal congestion, snoring and tinnitus Respiratory: negative for asthma, cough, sputum Cardiovascular: negative for chest pain, dyspnea, exertional chest pressure/discomfort, irregular heart beat, palpitations and syncope Gastrointestinal: negative for abdominal pain, change in bowel habits, nausea and vomiting Genitourinary: negative for abnormal menstrual periods, genital lesions, sexual problems and vaginal discharge, dysuria and urinary incontinence Integument/breast: negative for breast lump, breast tenderness and nipple discharge Hematologic/lymphatic: negative for bleeding and easy bruising Musculoskeletal:negative for back pain and muscle weakness Neurological: negative for dizziness, headaches, vertigo and weakness Endocrine: negative for diabetic symptoms including polydipsia, polyuria and skin dryness Allergic/Immunologic: negative for hay fever and urticaria      Objective:  Blood pressure 102/65, height 5\' 2"  (1.575 m), weight 158 lb 6.4 oz (71.8 kg), unknown if currently breastfeeding.  Body mass index is 28.97 kg/m.   General Appearance:    Alert, cooperative, no distress, appears stated age  Head:    Normocephalic, without obvious abnormality, atraumatic  Eyes:    PERRL, conjunctiva/corneas clear, EOM's intact, both eyes  Ears:    Normal external ear canals, both ears  Nose:   Nares normal, septum midline, mucosa normal, no drainage or sinus tenderness  Throat:   Lips, mucosa, and tongue normal; teeth and gums normal  Neck:   Supple, symmetrical, trachea midline, no adenopathy; thyroid: no enlargement/tenderness/nodules; no carotid bruit or JVD  Back:     Symmetric, no curvature, ROM normal, no CVA tenderness  Lungs:     Clear to auscultation bilaterally, respirations unlabored  Chest Wall:    No tenderness or deformity   Heart:    Regular rate and rhythm, S1 and S2 normal, no murmur, rub or gallop  Breast Exam:     No tenderness, masses, or nipple abnormality  Abdomen:     Soft, non-tender, bowel sounds active all four quadrants, no masses, no organomegaly.    Genitalia:    Pelvic:external genitalia normal, vagina without lesions, discharge, or tenderness, rectovaginal septum  normal. Cervix normal in appearance, no cervical motion tenderness, no adnexal masses or tenderness.  Uterus normal size, shape, mobile, regular contours, nontender.  Rectal:    Normal external sphincter.  No hemorrhoids appreciated. Internal exam not done.   Extremities:   Extremities normal, atraumatic, no cyanosis or edema  Pulses:   2+ and symmetric all extremities  Skin:   Skin color, texture, turgor normal, no rashes or lesions  Lymph nodes:   Cervical, supraclavicular, and axillary nodes normal  Neurologic:   CNII-XII intact, normal strength, sensation and reflexes throughout   .  Labs:  Lab Results  Component Value Date   WBC 12.6 (H) 09/15/2019   HGB 10.6 (L) 09/15/2019   HCT 34.4 (L) 09/15/2019   MCV 74.6 (L) 09/15/2019   PLT 358 09/15/2019    Lab Results  Component Value Date   CREATININE 0.81 01/14/2017   BUN 10 01/14/2017   NA 138 01/14/2017   K 3.1 (L) 01/14/2017   CL 103 01/14/2017   CO2 25 01/14/2017    Lab Results  Component Value Date   ALT 34 (H) 03/08/2019   AST 15 03/08/2019   ALKPHOS 109 03/08/2019   BILITOT <0.2 03/08/2019    No results found for: TSH   Assessment:   1. Well woman exam with routine gynecological exam   2. Overweight (BMI 25.0-29.9)   3. IUD (intrauterine device) in place      Plan:  Blood tests: None ordered. Has a standing order by her PCP to have her labs drawn soon. Breast self exam technique reviewed and patient encouraged to perform self-exam monthly. Contraception: Liletta IUD. Can remain in place for up to 7 years.  Discussed healthy lifestyle modifications. Pap smear ordered. COVID vaccination status: has completed series and 1st booster. Is eligible  for next booster in series Flu vaccine: up to date.  Follow up in 1 year for annual exam   Rubie Maid, MD [facility]

## 2021-01-23 LAB — CYTOLOGY - PAP: Diagnosis: NEGATIVE

## 2021-03-26 ENCOUNTER — Other Ambulatory Visit: Payer: Self-pay | Admitting: Obstetrics and Gynecology

## 2021-08-19 IMAGING — CR DG CHEST 2V
1 series · 2 of 2 positions shown · non-contrast
Comparison: 01/14/2017

CLINICAL DATA: Sore throat and shortness of breath.

EXAM:
CHEST - 2 VIEW

[Series 1: dg chest 2 view · 0.14mm/px · 2 of 2 slices shown]
[im 1/2]
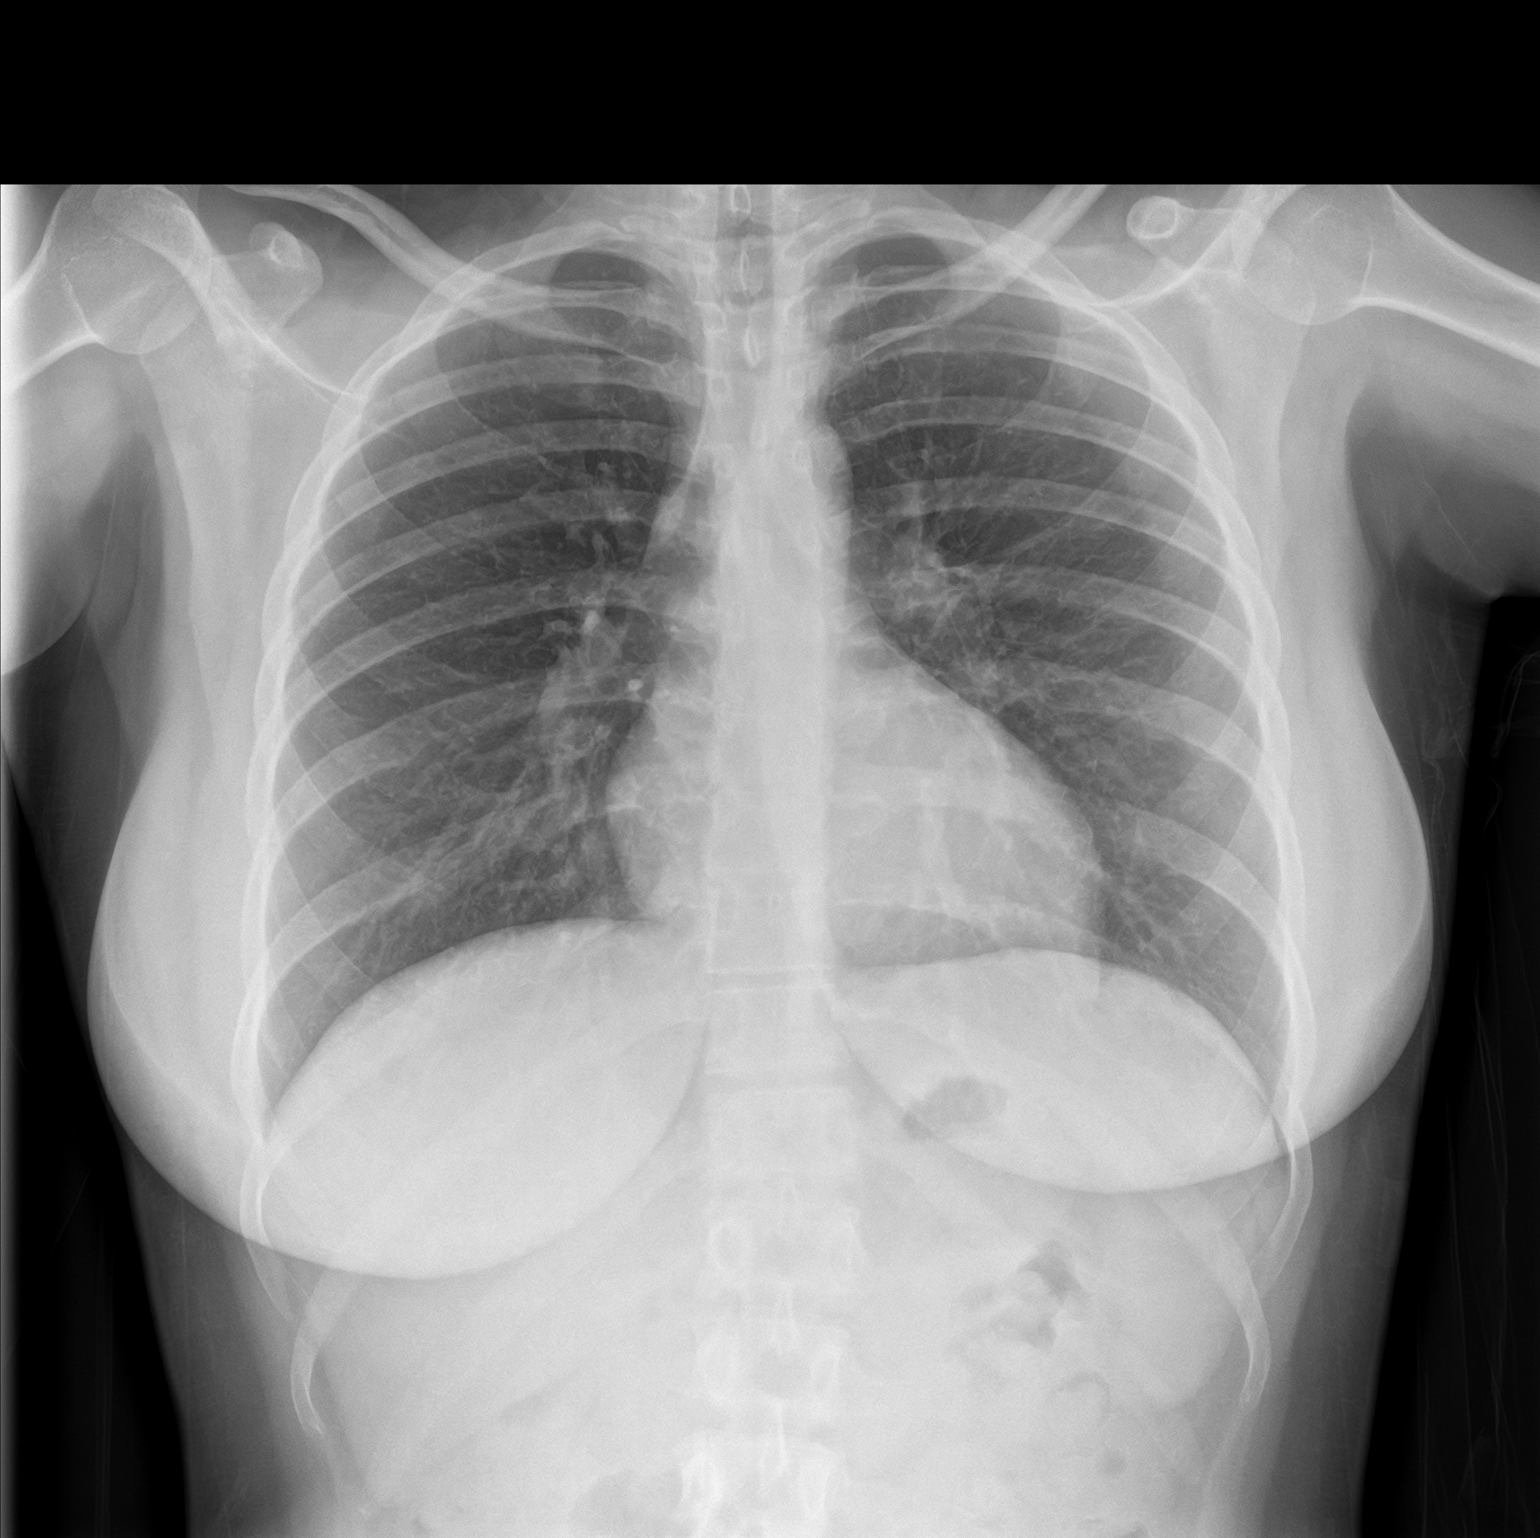
[im 2/2]
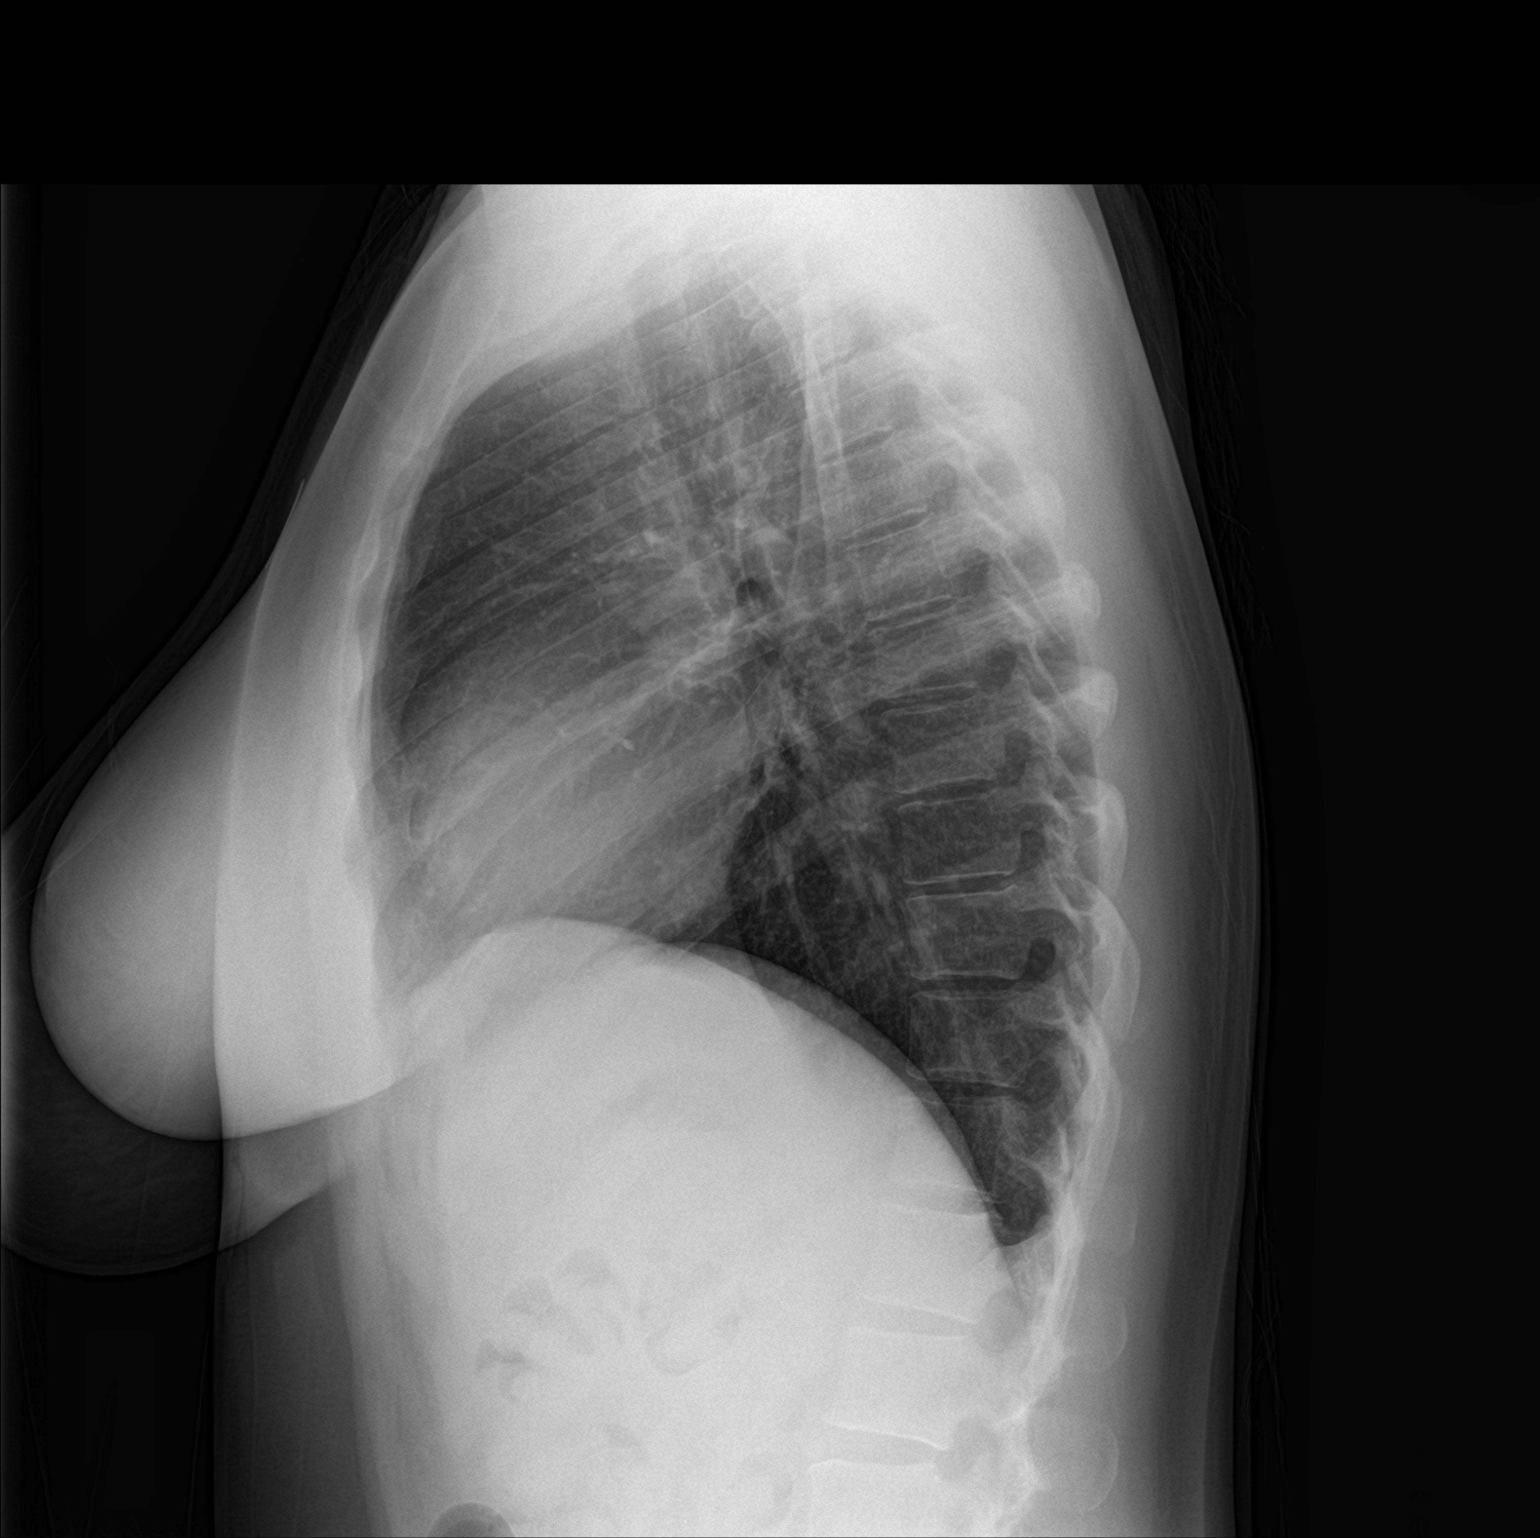

[2 of 2 positions shown; findings below may reference images not displayed]

FINDINGS: The heart size and mediastinal contours are within normal limits.
Both lungs are clear. The visualized skeletal structures are
unremarkable.
IMPRESSION: Normal chest x-ray.

## 2021-10-01 ENCOUNTER — Other Ambulatory Visit: Payer: Self-pay | Admitting: Obstetrics and Gynecology

## 2022-04-09 ENCOUNTER — Encounter: Payer: Self-pay | Admitting: Obstetrics and Gynecology

## 2022-04-09 ENCOUNTER — Other Ambulatory Visit: Payer: Self-pay | Admitting: Gastroenterology

## 2022-04-09 ENCOUNTER — Ambulatory Visit (INDEPENDENT_AMBULATORY_CARE_PROVIDER_SITE_OTHER): Payer: BC Managed Care – PPO | Admitting: Obstetrics and Gynecology

## 2022-04-09 VITALS — BP 102/70 | Ht 62.0 in | Wt 176.0 lb

## 2022-04-09 DIAGNOSIS — R7989 Other specified abnormal findings of blood chemistry: Secondary | ICD-10-CM

## 2022-04-09 DIAGNOSIS — Z30432 Encounter for removal of intrauterine contraceptive device: Secondary | ICD-10-CM | POA: Diagnosis not present

## 2022-04-09 NOTE — Progress Notes (Signed)
Celene Squibb, MD   Chief Complaint  Patient presents with   Contraception    Removal due to high liver enzymes? Discuss other BC options    HPI:      Ms. IVERY JARDON is a 27 y.o. [phone] whose LMP was No LMP recorded. (Menstrual status: IUD)., presents today for IUD removal due to elevated LFTs. Liletta inserted 11/21. Have been elevated for many yrs; pt used to be on estrogen containing OCPs; but sx persist with IUD. Has had liver bx and seen 3 GI MDs who think it's related to IUD. Pt's husband is getting vasectomy. Pt will do condoms in meantime.    Patient Active Problem List   Diagnosis Date Noted   Rubella non-immune status, antepartum 09/17/2019   Term pregnancy 09/15/2019   Pregnancy 09/14/2019   Amniotic fluid index increased 07/26/2019   History of liver biopsy 02/05/2019   SI (sacroiliac) joint dysfunction 07/25/2018   Nonallopathic lesion of sacral region 07/25/2018   Nonallopathic lesion of thoracic region 07/25/2018   Nonallopathic lesion of lumbosacral region 07/25/2018   Elevated LFTs 11/03/2016   Bloating 11/03/2016   Abnormal liver enzymes 07/31/2014   Acid reflux 07/31/2014   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   Cardiac murmur 07/31/2014   Avitaminosis D 07/31/2014   HLD (hyperlipidemia) 07/31/2014   Elevated carcinoembryonic antigen 07/31/2014   Anxiety and depression 07/12/2014   Dysmenorrhea 07/12/2014   Contraception management 07/12/2014    Past Surgical History:  Procedure Laterality Date   BREAST SURGERY Bilateral 2014   Breast Reduction   LIVER BIOPSY  08/10/14    Family History  Problem Relation Age of Onset   Hypercholesterolemia Mother    Heart disease Father    Alcohol abuse Father    Breast cancer Paternal Grandmother        48s   Colon cancer Paternal Grandmother        79s   Ovarian cancer Neg Hx     Social History   Socioeconomic History   Marital status: Married    Spouse name: Not on file   Number of children:  Not on file   Years of education: Not on file   Highest education level: Not on file  Occupational History   Occupation: lab tech  Tobacco Use   Smoking status: Never   Smokeless tobacco: Never  Vaping Use   Vaping Use: Never used  Substance and Sexual Activity   Alcohol use: Not Currently   Drug use: Not Currently    Types: Marijuana   Sexual activity: Yes    Birth control/protection: I.U.D.  Other Topics Concern   Not on file  Social History Narrative   Not on file   Social Determinants of Health   Financial Resource Strain: Not on file  Food Insecurity: Not on file  Transportation Needs: Not on file  Physical Activity: Not on file  Stress: Not on file  Social Connections: Not on file  Intimate Partner Violence: Not At Risk (12/08/2017)   Humiliation, Afraid, Rape, and Kick questionnaire    Fear of Current or Ex-Partner: No    Emotionally Abused: No    Physically Abused: No    Sexually Abused: No    Outpatient Medications Prior to Visit  Medication Sig Dispense Refill   ondansetron (ZOFRAN) 4 MG tablet Take 4 mg by mouth every 8 (eight) hours as needed.     pantoprazole (PROTONIX) 40 MG tablet Take by mouth.     Vilazodone HCl 20  MG TABS Take by mouth.     Vitamin D, Ergocalciferol, 50000 units CAPS Take 1 capsule by mouth once a week.     levonorgestrel (LILETTA, 52 MG,) 19.5 MCG/DAY IUD IUD 1 each by Intrauterine route once. Inserted 11/28/2019     albuterol (VENTOLIN HFA) 108 (90 Base) MCG/ACT inhaler Inhale 2 puffs into the lungs every 6 (six) hours as needed for wheezing or shortness of breath. 8 g 0   citalopram (CELEXA) 40 MG tablet TAKE 1 TABLET BY MOUTH EVERY DAY 90 tablet 1   No facility-administered medications prior to visit.      ROS:  Review of Systems  Constitutional:  Negative for fever.  Gastrointestinal:  Negative for blood in stool, constipation, diarrhea, nausea and vomiting.  Genitourinary:  Negative for dyspareunia, dysuria, flank pain,  frequency, hematuria, urgency, vaginal bleeding, vaginal discharge and vaginal pain.  Musculoskeletal:  Negative for back pain.  Skin:  Negative for rash.   BREAST: No symptoms  Pelvic exam:  Two IUD strings present seen coming from the cervical os. EGBUS, vaginal vault and cervix: within normal limits  IUD Removal Strings of IUD identified and grasped.  IUD removed without problem with ring forceps.  Pt tolerated this well.  IUD noted to be intact.   Assessment/Plan: Encounter for IUD removal  Pt's husband to get vasectomy. F/u prn.     Return if symptoms worsen or fail to improve.  Willie Plain B. Menaal Russum, PA-C 04/09/2022 3:03 PM

## 2022-04-15 ENCOUNTER — Ambulatory Visit
Admission: RE | Admit: 2022-04-15 | Discharge: 2022-04-15 | Disposition: A | Discharge status: Home / Self Care | Payer: BC Managed Care – PPO | Source: Ambulatory Visit | Attending: Gastroenterology | Admitting: Gastroenterology

## 2022-04-15 DIAGNOSIS — R7989 Other specified abnormal findings of blood chemistry: Secondary | ICD-10-CM | POA: Insufficient documentation

## 2022-04-15 MED ORDER — GADOBUTROL 1 MMOL/ML IV SOLN
7.5000 mL | Freq: Once | INTRAVENOUS | Status: AC | PRN
  Administered 2022-04-15: 7.5 mL via INTRAVENOUS

## 2022-10-10 ENCOUNTER — Emergency Department: Payer: BC Managed Care – PPO

## 2022-10-10 ENCOUNTER — Other Ambulatory Visit: Payer: Self-pay

## 2022-10-10 ENCOUNTER — Emergency Department
Admission: EM | Admit: 2022-10-10 | Discharge: 2022-10-10 | Disposition: A | Discharge status: Home / Self Care | Payer: BC Managed Care – PPO | Attending: Emergency Medicine | Admitting: Emergency Medicine

## 2022-10-10 DIAGNOSIS — R110 Nausea: Secondary | ICD-10-CM | POA: Insufficient documentation

## 2022-10-10 DIAGNOSIS — R197 Diarrhea, unspecified: Secondary | ICD-10-CM | POA: Insufficient documentation

## 2022-10-10 DIAGNOSIS — Z20822 Contact with and (suspected) exposure to covid-19: Secondary | ICD-10-CM | POA: Insufficient documentation

## 2022-10-10 DIAGNOSIS — E860 Dehydration: Secondary | ICD-10-CM | POA: Diagnosis not present

## 2022-10-10 DIAGNOSIS — R42 Dizziness and giddiness: Secondary | ICD-10-CM

## 2022-10-10 DIAGNOSIS — R1031 Right lower quadrant pain: Secondary | ICD-10-CM | POA: Insufficient documentation

## 2022-10-10 LAB — SARS CORONAVIRUS 2 BY RT PCR: SARS Coronavirus 2 by RT PCR: NEGATIVE

## 2022-10-10 LAB — COMPREHENSIVE METABOLIC PANEL
ALT: 121 U/L — ABNORMAL HIGH (ref 0–44)
AST: 39 U/L (ref 15–41)
Albumin: 4.5 g/dL (ref 3.5–5.0)
Alkaline Phosphatase: 124 U/L (ref 38–126)
Anion gap: 12 (ref 5–15)
BUN: 10 mg/dL (ref 6–20)
CO2: 25 mmol/L (ref 22–32)
Calcium: 9.2 mg/dL (ref 8.9–10.3)
Chloride: 101 mmol/L (ref 98–111)
Creatinine, Ser: 0.79 mg/dL (ref 0.44–1.00)
GFR, Estimated: >60 mL/min (ref >60)
Glucose, Bld: 119 mg/dL — ABNORMAL HIGH (ref 70–99)
Potassium: 3.8 mmol/L (ref 3.5–5.1)
Sodium: 138 mmol/L (ref 135–145)
Total Bilirubin: 0.9 mg/dL (ref 0.3–1.2)
Total Protein: 7.3 g/dL (ref 6.5–8.1)

## 2022-10-10 LAB — CBC
HCT: 41.3 % (ref 36.0–46.0)
Hemoglobin: 13.7 g/dL (ref 12.0–15.0)
MCH: 29.8 pg (ref 26.0–34.0)
MCHC: 33.2 g/dL (ref 30.0–36.0)
MCV: 89.8 fL (ref 80.0–100.0)
Platelets: 322 10*3/uL (ref 150–400)
RBC: 4.6 MIL/uL (ref 3.87–5.11)
RDW: 12.2 % (ref 11.5–15.5)
WBC: 7.6 10*3/uL (ref 4.0–10.5)
nRBC: 0 % (ref 0.0–0.2)

## 2022-10-10 LAB — PREGNANCY, URINE: Preg Test, Ur: NEGATIVE

## 2022-10-10 LAB — URINALYSIS, ROUTINE W REFLEX MICROSCOPIC
Bilirubin Urine: NEGATIVE
Glucose, UA: NEGATIVE mg/dL
Hgb urine dipstick: NEGATIVE
Ketones, ur: 80 mg/dL — AB
Leukocytes,Ua: NEGATIVE
Nitrite: NEGATIVE
Protein, ur: NEGATIVE mg/dL
Specific Gravity, Urine: 1.01 (ref 1.005–1.030)
pH: 7 (ref 5.0–8.0)

## 2022-10-10 LAB — CBG MONITORING, ED: Glucose-Capillary: 112 mg/dL — ABNORMAL HIGH (ref 70–99)

## 2022-10-10 MED ORDER — IOHEXOL 300 MG/ML  SOLN
100.0000 mL | Freq: Once | INTRAMUSCULAR | Status: AC | PRN
  Administered 2022-10-10: 100 mL via INTRAVENOUS

## 2022-10-10 MED ORDER — ONDANSETRON HCL 4 MG/2ML IJ SOLN
4.0000 mg | Freq: Once | INTRAMUSCULAR | Status: AC
  Administered 2022-10-10: 4 mg via INTRAVENOUS
  Filled 2022-10-10: qty 2

## 2022-10-10 MED ORDER — SODIUM CHLORIDE 0.9 % IV BOLUS
1000.0000 mL | Freq: Once | INTRAVENOUS | Status: AC
  Administered 2022-10-10: 1000 mL via INTRAVENOUS

## 2022-10-10 NOTE — ED Triage Notes (Signed)
Pt to ED c/o medication reaction. Reports recently started on new medication - Qelbree (ADHD medication) when Symptoms started, reports feeling dizzy, diarrhea, emesis.

## 2022-10-10 NOTE — ED Provider Notes (Signed)
[facility] Provider Note    Event Date/Time   First MD Initiated Contact with Patient 10/10/22 1804     (approximate)   History   Emesis, Diarrhea, and Medication Reaction   HPI  BRYLYNN BOEHRINGER is a 27 y.o. female with history of ADHD, anxiety, depression, GERD presents emergency department with complaints of diarrhea, nausea, dizziness.  Patient states she started having diarrhea almost a week ago and then 3 days later started new medication for ADHD which is a nonstimulant.  States then the dizziness and nausea started also.  Patient does still have her appendix.  Denies fever or chills.  Denies known COVID exposure.      Physical Exam   Triage Vital Signs: ED Triage Vitals  Encounter Vitals Group     BP 10/10/22 1719 (!) 137/98     Systolic BP Percentile --      Diastolic BP Percentile --      Pulse Rate 10/10/22 1719 80     Resp 10/10/22 1719 16     Temp 10/10/22 1719 98.4 F (36.9 C)     Temp Source 10/10/22 1719 Oral     SpO2 10/10/22 1719 98 %     Weight 10/10/22 1722 170 lb (77.1 kg)     Height 10/10/22 1722 5\' 2"  (1.575 m)     Head Circumference --      Peak Flow --      Pain Score 10/10/22 1722 0     Pain Loc --      Pain Education --      Exclude from Growth Chart --     Most recent vital signs: Vitals:   10/10/22 1719  BP: (!) 137/98  Pulse: 80  Resp: 16  Temp: 98.4 F (36.9 C)  SpO2: 98%     General: Awake, no distress.   CV:  Good peripheral perfusion. regular rate and  rhythm Resp:  Normal effort. Lungs cta Abd:  No distention.  Tender in the right lower quadrant, bowel sounds decreased all 4 quads Other:      ED Results / Procedures / Treatments   Labs (all labs ordered are listed, but only abnormal results are displayed) Labs Reviewed  COMPREHENSIVE METABOLIC PANEL - Abnormal; Notable for the following components:      Result Value   Glucose, Bld 119 (*)    ALT 121 (*)    All other components within  normal limits  URINALYSIS, ROUTINE W REFLEX MICROSCOPIC - Abnormal; Notable for the following components:   Color, Urine YELLOW (*)    APPearance HAZY (*)    Ketones, ur 80 (*)    All other components within normal limits  CBG MONITORING, ED - Abnormal; Notable for the following components:   Glucose-Capillary 112 (*)    All other components within normal limits  SARS CORONAVIRUS 2 BY RT PCR  CBC  PREGNANCY, URINE  POC URINE PREG, ED     EKG     RADIOLOGY CT abdomen pelvis IV contrast to rule out appendicitis    PROCEDURES:   Procedures   MEDICATIONS ORDERED IN ED: Medications  sodium chloride 0.9 % bolus 1,000 mL (1,000 mLs Intravenous New Bag/Given 10/10/22 2004)  ondansetron (ZOFRAN) injection 4 mg (4 mg Intravenous Given 10/10/22 2005)  iohexol (OMNIPAQUE) 300 MG/ML solution 100 mL (100 mLs Intravenous Contrast Given 10/10/22 2017)     IMPRESSION / MDM / ASSESSMENT AND PLAN / ED COURSE  I reviewed the triage  vital signs and the nursing notes.                              Differential diagnosis includes, but is not limited to, acute appendicitis, ovarian cyst, colitis, gastroenteritis, medication reaction  Patient's presentation is most consistent with acute illness / injury with system symptoms.   Labs and imaging ordered  Patient was given IV, normal saline 1 L IV, Zofran 4 mg IV   ----------------------------------------- 9:07 PM on 10/10/2022 ----------------------------------------- Labs are reassuring  CT abdomen pelvis, independently reviewed and interpreted radiologist reading as being positive for possible early appendicitis.  Reexamination of the patient, she is still tender in the right lower quadrant, has no appetite  Consult surgery Spoke with Dr. Claudine Mouton.  He agrees it is unchanged since 2019.  Agrees treatment can be conservative with sending her home and returning if worsening.  I did explain the findings patient.  She is to return  the emergency department worsening.  Follow-up with regular doctor as needed.  Take Imodium A-D for diarrhea.  Discuss her medication reaction with her regular doctor.  She is agreement treatment plan.  Discharged stable condition.     FINAL CLINICAL IMPRESSION(S) / ED DIAGNOSES   Final diagnoses:  Diarrhea, unspecified type  Dehydration  Dizziness  RLQ abdominal pain     Rx / DC Orders   ED Discharge Orders     None        Note:  This document was prepared using Dragon voice recognition software and may include unintentional dictation errors.    Faythe Ghee, PA-C 10/10/22 2130    Jene Every, MD 10/11/22 [phone]

## 2022-10-10 NOTE — Discharge Instructions (Signed)
Follow-up with your regular doctor if not improving in 3 days.  Return emergency department if worsening.  Take medications as prescribed.  Continue to drink plenty of fluids.  For the diarrhea you can try over-the-counter Imodium A-D.

## 2022-10-15 ENCOUNTER — Other Ambulatory Visit: Payer: Self-pay | Admitting: Family Medicine

## 2022-10-15 ENCOUNTER — Ambulatory Visit
Admission: RE | Admit: 2022-10-15 | Discharge: 2022-10-15 | Disposition: A | Discharge status: Home / Self Care | Payer: BC Managed Care – PPO | Source: Ambulatory Visit | Attending: Family Medicine | Admitting: Family Medicine

## 2022-10-15 DIAGNOSIS — R630 Anorexia: Secondary | ICD-10-CM | POA: Diagnosis present

## 2022-10-15 DIAGNOSIS — R1031 Right lower quadrant pain: Secondary | ICD-10-CM | POA: Insufficient documentation

## 2022-10-15 DIAGNOSIS — R112 Nausea with vomiting, unspecified: Secondary | ICD-10-CM

## 2022-10-15 MED ORDER — IOHEXOL 300 MG/ML  SOLN
100.0000 mL | Freq: Once | INTRAMUSCULAR | Status: AC | PRN
  Administered 2022-10-15: 100 mL via INTRAVENOUS

## 2022-10-23 ENCOUNTER — Other Ambulatory Visit: Payer: Self-pay | Admitting: Family Medicine

## 2022-10-23 ENCOUNTER — Ambulatory Visit
Admission: RE | Admit: 2022-10-23 | Discharge: 2022-10-23 | Disposition: A | Discharge status: Home / Self Care | Payer: BC Managed Care – PPO | Source: Ambulatory Visit | Attending: Family Medicine | Admitting: Family Medicine

## 2022-10-23 DIAGNOSIS — K219 Gastro-esophageal reflux disease without esophagitis: Secondary | ICD-10-CM | POA: Diagnosis present

## 2022-10-23 DIAGNOSIS — R1011 Right upper quadrant pain: Secondary | ICD-10-CM

## 2022-10-23 DIAGNOSIS — R110 Nausea: Secondary | ICD-10-CM

## 2022-11-10 ENCOUNTER — Other Ambulatory Visit: Payer: Self-pay | Admitting: Gastroenterology

## 2022-11-10 DIAGNOSIS — R7989 Other specified abnormal findings of blood chemistry: Secondary | ICD-10-CM

## 2022-11-22 ENCOUNTER — Emergency Department (HOSPITAL_COMMUNITY)
Admission: EM | Admit: 2022-11-22 | Discharge: 2022-11-22 | Disposition: A | Discharge status: Home / Self Care | Payer: BC Managed Care – PPO | Attending: Emergency Medicine | Admitting: Emergency Medicine

## 2022-11-22 ENCOUNTER — Encounter (HOSPITAL_COMMUNITY): Payer: Self-pay

## 2022-11-22 ENCOUNTER — Other Ambulatory Visit: Payer: Self-pay

## 2022-11-22 ENCOUNTER — Emergency Department (HOSPITAL_COMMUNITY): Payer: BC Managed Care – PPO

## 2022-11-22 DIAGNOSIS — R7401 Elevation of levels of liver transaminase levels: Secondary | ICD-10-CM | POA: Diagnosis not present

## 2022-11-22 DIAGNOSIS — R109 Unspecified abdominal pain: Secondary | ICD-10-CM | POA: Diagnosis present

## 2022-11-22 DIAGNOSIS — R1084 Generalized abdominal pain: Secondary | ICD-10-CM | POA: Diagnosis not present

## 2022-11-22 LAB — URINALYSIS, ROUTINE W REFLEX MICROSCOPIC
Bilirubin Urine: NEGATIVE
Glucose, UA: NEGATIVE mg/dL
Hgb urine dipstick: NEGATIVE
Ketones, ur: NEGATIVE mg/dL
Leukocytes,Ua: NEGATIVE
Nitrite: NEGATIVE
Protein, ur: NEGATIVE mg/dL
Specific Gravity, Urine: 1.018 (ref 1.005–1.030)
pH: 6 (ref 5.0–8.0)

## 2022-11-22 LAB — COMPREHENSIVE METABOLIC PANEL
ALT: 103 U/L — ABNORMAL HIGH (ref 0–44)
AST: 65 U/L — ABNORMAL HIGH (ref 15–41)
Albumin: 3.9 g/dL (ref 3.5–5.0)
Alkaline Phosphatase: 146 U/L — ABNORMAL HIGH (ref 38–126)
Anion gap: 11 (ref 5–15)
BUN: 11 mg/dL (ref 6–20)
CO2: 24 mmol/L (ref 22–32)
Calcium: 9.1 mg/dL (ref 8.9–10.3)
Chloride: 104 mmol/L (ref 98–111)
Creatinine, Ser: 0.82 mg/dL (ref 0.44–1.00)
GFR, Estimated: >60 mL/min (ref >60)
Glucose, Bld: 107 mg/dL — ABNORMAL HIGH (ref 70–99)
Potassium: 4 mmol/L (ref 3.5–5.1)
Sodium: 139 mmol/L (ref 135–145)
Total Bilirubin: 0.4 mg/dL (ref 0.3–1.2)
Total Protein: 6.9 g/dL (ref 6.5–8.1)

## 2022-11-22 LAB — LIPASE, BLOOD: Lipase: 29 U/L (ref 11–51)

## 2022-11-22 LAB — CBC
HCT: 41.6 % (ref 36.0–46.0)
Hemoglobin: 13.9 g/dL (ref 12.0–15.0)
MCH: 29.4 pg (ref 26.0–34.0)
MCHC: 33.4 g/dL (ref 30.0–36.0)
MCV: 88.1 fL (ref 80.0–100.0)
Platelets: 322 10*3/uL (ref 150–400)
RBC: 4.72 MIL/uL (ref 3.87–5.11)
RDW: 12.7 % (ref 11.5–15.5)
WBC: 4.4 10*3/uL (ref 4.0–10.5)
nRBC: 0 % (ref 0.0–0.2)

## 2022-11-22 LAB — HCG, SERUM, QUALITATIVE: Preg, Serum: NEGATIVE

## 2022-11-22 MED ORDER — IOHEXOL 350 MG/ML SOLN
75.0000 mL | Freq: Once | INTRAVENOUS | Status: AC | PRN
  Administered 2022-11-22: 75 mL via INTRAVENOUS

## 2022-11-22 MED ORDER — ONDANSETRON 4 MG PO TBDP: 4.0000 mg | ORAL_TABLET | Freq: Three times a day (TID) | ORAL | 0 refills | Status: DC | PRN

## 2022-11-22 MED ORDER — FENTANYL CITRATE PF 50 MCG/ML IJ SOSY
25.0000 ug | PREFILLED_SYRINGE | Freq: Once | INTRAMUSCULAR | Status: AC
  Administered 2022-11-22: 25 ug via INTRAVENOUS
  Filled 2022-11-22: qty 1

## 2022-11-22 MED ORDER — DICYCLOMINE HCL 20 MG PO TABS: 20.0000 mg | ORAL_TABLET | Freq: Two times a day (BID) | ORAL | 0 refills | Status: DC

## 2022-11-22 MED ORDER — ONDANSETRON HCL 4 MG/2ML IJ SOLN
4.0000 mg | Freq: Once | INTRAMUSCULAR | Status: AC
  Administered 2022-11-22: 4 mg via INTRAVENOUS
  Filled 2022-11-22: qty 2

## 2022-11-22 NOTE — ED Notes (Signed)
Pt states she had a cyst at her last OBGYN appt. They told her it would go away at her next menstrual cycle.  Pt told paramedic that her last cycle was Oct 7 and it was heavier than normal. Pt stated no possibility of pregnancy.

## 2022-11-22 NOTE — ED Notes (Signed)
Patient transported to CT 

## 2022-11-22 NOTE — ED Provider Notes (Signed)
 [place] EMERGENCY DEPARTMENT AT [facility] Provider Note   CSN: [id number] Arrival date & time: 11/22/22  1100     History Chief Complaint  Patient presents with   Abdominal Pain   Nausea    Rose Howard is a 27 y.o. female.  Patient past history significant for anxiety, depression, acid reflux, hyperlipidemia, elevated CEA, transaminitis here with concerns of abdominal pain, bloating as well as nausea and vomiting.  States that she has been having some mucus in her stools for the last week or so.  Prior to this, she was having some looser stools than typical.  Endorses intermittent abdominal pain off and on for the last month or so.  Patient reports that she was started on Plaquenil by her rheumatologist about 1 month ago and noted that since around that time is unsure if they are related.  Denies any hematemesis but does endorse that she has had some blood in her stool although she does have hemorrhoids and these will at times bleed on occasion.  Feels that the hemorrhoids are currently aggravated from frequent bowel movements and feelings of needing to void bowels.   Abdominal Pain Associated symptoms: nausea        Home Medications Prior to Admission medications   Medication Sig Start Date End Date Taking? Authorizing Provider  dicyclomine (BENTYL) 20 MG tablet Take 1 tablet (20 mg total) by mouth 2 (two) times daily. 11/22/22  Yes Maryanna Shape A, PA-C  ondansetron (ZOFRAN-ODT) 4 MG disintegrating tablet Take 1 tablet (4 mg total) by mouth every 8 (eight) hours as needed. 11/22/22  Yes Maryanna Shape A, PA-C  ondansetron (ZOFRAN) 4 MG tablet Take 4 mg by mouth every 8 (eight) hours as needed. 02/18/22   [provider]  pantoprazole (PROTONIX) 40 MG tablet Take by mouth. 03/31/22 03/31/23  [provider]  Vilazodone HCl 20 MG TABS Take by mouth.    [provider]  Vitamin D, Ergocalciferol, 50000 units CAPS Take 1 capsule by mouth once a  week. 03/31/22   [provider]      Allergies    Patient has no known allergies.    Review of Systems   Review of Systems  Gastrointestinal:  Positive for abdominal pain and nausea.  All other systems reviewed and are negative.   Physical Exam Updated Vital Signs BP 113/76   Pulse 72   Temp 98.8 F (37.1 C) (Oral)   Resp (!) 22   Ht 5\' 2"  (1.575 m)   Wt 74.8 kg   LMP 10/26/2022   SpO2 98%   BMI 30.18 kg/m  Physical Exam Vitals and nursing note reviewed.  Constitutional:      General: She is not in acute distress.    Appearance: She is well-developed.  HENT:     Head: Normocephalic and atraumatic.  Eyes:     Conjunctiva/sclera: Conjunctivae normal.  Cardiovascular:     Rate and Rhythm: Normal rate and regular rhythm.     Heart sounds: No murmur heard. Pulmonary:     Effort: Pulmonary effort is normal. No respiratory distress.     Breath sounds: Normal breath sounds.  Abdominal:     Palpations: Abdomen is soft.     Tenderness: There is generalized abdominal tenderness.     Hernia: No hernia is present.  Musculoskeletal:        General: No swelling.     Cervical back: Neck supple.  Skin:    General: Skin is  warm and dry.     Capillary Refill: Capillary refill takes less than 2 seconds.  Neurological:     Mental Status: She is alert.  Psychiatric:        Mood and Affect: Mood normal.     ED Results / Procedures / Treatments   Labs (all labs ordered are listed, but only abnormal results are displayed) Labs Reviewed  COMPREHENSIVE METABOLIC PANEL - Abnormal; Notable for the following components:      Result Value   Glucose, Bld 107 (*)    AST 65 (*)    ALT 103 (*)    Alkaline Phosphatase 146 (*)    All other components within normal limits  LIPASE, BLOOD  CBC  URINALYSIS, ROUTINE W REFLEX MICROSCOPIC  HCG, SERUM, QUALITATIVE    EKG None  Radiology CT ABDOMEN PELVIS W CONTRAST  Result Date: 11/22/2022 CLINICAL DATA:  Acute non  localized abdominal pain. EXAM: CT ABDOMEN AND PELVIS WITH CONTRAST TECHNIQUE: Multidetector CT imaging of the abdomen and pelvis was performed using the standard protocol following bolus administration of intravenous contrast. RADIATION DOSE REDUCTION: This exam was performed according to the departmental dose-optimization program which includes automated exposure control, adjustment of the mA and/or kV according to patient size and/or use of iterative reconstruction technique. CONTRAST:  75mL OMNIPAQUE IOHEXOL 350 MG/ML SOLN COMPARISON:  10/15/2022 FINDINGS: Lower Chest: No acute findings. Hepatobiliary: No suspicious hepatic masses identified. Gallbladder is unremarkable. No evidence of biliary ductal dilatation. Pancreas:  No mass or inflammatory changes. Spleen: Within normal limits in size and appearance. Adrenals/Urinary Tract: No suspicious masses identified. No evidence of ureteral calculi or hydronephrosis. Stomach/Bowel: No evidence of obstruction, inflammatory process or abnormal fluid collections. Normal appendix visualized. Vascular/Lymphatic: No pathologically enlarged lymph nodes. No acute vascular findings. Reproductive:  No mass or other significant abnormality. Other:  None. Musculoskeletal:  No suspicious bone lesions identified. IMPRESSION: Negative. No acute findings or other significant abnormality. Electronically Signed   By: Danae Orleans M.D.   On: 11/22/2022 14:04    Procedures Procedures   Medications Ordered in ED Medications  ondansetron (ZOFRAN) injection 4 mg (4 mg Intravenous Given 11/22/22 1229)  fentaNYL (SUBLIMAZE) injection 25 mcg (25 mcg Intravenous Given 11/22/22 1232)  iohexol (OMNIPAQUE) 350 MG/ML injection 75 mL (75 mLs Intravenous Contrast Given 11/22/22 1322)    ED Course/ Medical Decision Making/ A&P                               Medical Decision Making Amount and/or Complexity of Data Reviewed Labs: ordered. Radiology: ordered.  Risk Prescription drug  management.   This patient presents to the ED for concern of abdominal pain.  Differential diagnosis includes bowel obstruction, uterine fibroid, diverticulitis, gastroenteritis   Lab Tests:  I Ordered, and personally interpreted labs.  The pertinent results include: CBC is unremarkable with no evidence of leukocytosis, urinalysis without signs of infection, hCG negative   Imaging Studies ordered:  I ordered imaging studies including CT abdomen pelvis I independently visualized and interpreted imaging which showed no acute abnormal findings I agree with the radiologist interpretation   Medicines ordered and prescription drug management:  I ordered medication including Zofran, fentanyl for nausea and pain Reevaluation of the patient after these medicines showed that the patient improved I have reviewed the patients home medicines and have made adjustments as needed   Problem List / ED Course:  Patient with past history significant for psoriatic  arthritis, anxiety, depression, acid reflux, hyperlipidemia, elevated CEA, and transaminitis here with concerns of abdominal pain.  Reports that this pain has been ongoing for about the last month or so for the last week has had worsening abdominal pain/bloating as well as nausea and vomiting with some mucus in her stools.  Denies any hematemesis or medic easier.  Not on blood thinners.  No prior history of any GI abnormalities or family history of any GI conditions.  Is currently being worked up by GI for transaminitis with a scheduled liver biopsy.  Given subacute duration of symptoms, will perform thorough workup for possible abnormalities in the abdomen including CBC, CMP, hCG, lipase, urinalysis.  Will also order CT imaging for assessment of the abdomen.  Fentanyl and Zofran ordered for pain and nausea control. Patient's labs are largely reassuring with no evidence of leukocytosis or anemia.  hCG is negative.  Lipase unremarkable.  CMP does  show transaminitis which is consistent with patient's recent workup with GI for this.  No severe sudden elevation or worsening however patient is not downtrending yet.  Urinalysis without signs of infection. CT abdomen pelvis is negative for any acute findings.  Patient does report that she started Plaquenil about 1 month ago which is around the time of her symptoms began.  Suspect possible drug side effect that may be contributing to some of her symptoms. On reassessment of patient, she does report improvement in symptoms with the Zofran and fentanyl given.  Has not had the urge to vomit or defecate while here in the emergency department.  No fevers.  Given reassuring workup and no acute change in symptoms, anticipate the patient stable for discharge home with close follow-up with GI and rheumatology.  Unsure patient's Plaquenil may be contributing to her symptoms as a possible side effect of the medication.  Advised strict return precautions with patient.  Patient is in agreement with plan.  No other acute concerns at this time or concerning findings requiring further workup or admission.  Patient discharged home in stable condition.  Final Clinical Impression(s) / ED Diagnoses Final diagnoses:  Generalized abdominal pain  Transaminitis    Rx / DC Orders ED Discharge Orders          Ordered    ondansetron (ZOFRAN-ODT) 4 MG disintegrating tablet  Every 8 hours PRN        11/22/22 1443    dicyclomine (BENTYL) 20 MG tablet  2 times daily        11/22/22 1443              Smitty Knudsen, PA-C 11/22/22 1512    Anders Simmonds T, DO 11/23/22 1001

## 2022-11-22 NOTE — ED Triage Notes (Signed)
Pt c.o abd pain/bloating, n/v with mucous in her stools x1 week.  Pt has had intermittent pain for a month but it got worse this week.

## 2022-11-22 NOTE — Discharge Instructions (Addendum)
You were seen in the ER today with concerns of abdominal pain and nausea. Your labs and imaging were all largely reassuring but you still have elevated liver enzymes. I would suggest reaching out to your gastroenterologist for further evaluation of your symptoms if you have failure to improve. Also reach out to your rheumatologist to ensure that the Plaquenil is not contributing to these symptoms. If your symptoms persist or worsen, return to the ER.

## 2022-11-25 ENCOUNTER — Other Ambulatory Visit: Payer: Self-pay | Admitting: Student

## 2022-11-25 DIAGNOSIS — R748 Abnormal levels of other serum enzymes: Secondary | ICD-10-CM

## 2022-11-25 DIAGNOSIS — Z01812 Encounter for preprocedural laboratory examination: Secondary | ICD-10-CM

## 2022-11-25 NOTE — Progress Notes (Signed)
Patient for US guided Liver Biopsy on Thurs 11/26/2022, I called and spoke with the patient on the phone and gave pre-procedure instructions. Pt was made aware to be here at 9a, NPO after MN prior to procedure as well as driver post procedure/recovery/discharge. Pt stated understanding.  Called 11/25/22

## 2022-11-25 NOTE — H&P (Signed)
Chief Complaint: Patient was seen in consultation today for non-focal liver biopsy  Referring Physician(s): Ardelia Mems  Supervising Physician: Roanna Banning  Patient Status: [facility] - Out-pt  History of Present Illness: Rose Howard is a 27 y.o. female with a medical history significant for ADHD, anxiety, depression, psoriatic arthritis and elevated liver enzymes. She is followed by GI for persistent abdominal pain with elevated CEA and abnormal liver enzymes. She is familiar to IR from a prior non-focal liver biopsy 01/14/2017 which was negative for fibrosis or cirrhosis. Her liver enzymes have remained elevated and a repeat non-focal liver biopsy has been requested.   Past Medical History:  Diagnosis Date   Abnormal liver enzymes    Anxiety    Depression    Dysmenorrhea    GERD (gastroesophageal reflux disease)    History of COVID-19    Migraine     Past Surgical History:  Procedure Laterality Date   BREAST SURGERY Bilateral 2014   Breast Reduction   LIVER BIOPSY  08/10/14    Allergies: Patient has no known allergies.  Medications: Prior to Admission medications   Medication Sig Start Date End Date Taking? Authorizing Provider  dicyclomine (BENTYL) 20 MG tablet Take 1 tablet (20 mg total) by mouth 2 (two) times daily. 11/22/22   Smitty Knudsen, PA-C  ondansetron (ZOFRAN) 4 MG tablet Take 4 mg by mouth every 8 (eight) hours as needed. 02/18/22   [provider]  ondansetron (ZOFRAN-ODT) 4 MG disintegrating tablet Take 1 tablet (4 mg total) by mouth every 8 (eight) hours as needed. 11/22/22   Smitty Knudsen, PA-C  pantoprazole (PROTONIX) 40 MG tablet Take by mouth. 03/31/22 03/31/23  [provider]  Vilazodone HCl 20 MG TABS Take by mouth.    [provider]  Vitamin D, Ergocalciferol, 50000 units CAPS Take 1 capsule by mouth once a week. 03/31/22   [provider]     Family History  Problem Relation Age of Onset    Hypercholesterolemia Mother    Heart disease Father    Alcohol abuse Father    Breast cancer Paternal Grandmother        66s   Colon cancer Paternal Grandmother        79s   Ovarian cancer Neg Hx     Social History   Socioeconomic History   Marital status: Married    Spouse name: Not on file   Number of children: Not on file   Years of education: Not on file   Highest education level: Not on file  Occupational History   Occupation: lab tech  Tobacco Use   Smoking status: Never   Smokeless tobacco: Never  Vaping Use   Vaping status: Never Used  Substance and Sexual Activity   Alcohol use: Not Currently   Drug use: Not Currently    Types: Marijuana   Sexual activity: Yes    Birth control/protection: I.U.D.  Other Topics Concern   Not on file  Social History Narrative   Not on file   Social Determinants of Health   Financial Resource Strain: Low Risk  (10/23/2022)   Received from [facility]   Overall Financial Resource Strain (CARDIA)    Difficulty of Paying Living Expenses: Not hard at all  Food Insecurity: No Food Insecurity (10/23/2022)   Received from [facility]   Hunger Vital Sign    Worried About Running Out of Food in the Last Year: Never true  Ran Out of Food in the Last Year: Never true  Transportation Needs: No Transportation Needs (10/23/2022)   Received from [facility] - Transportation    In the past 12 months, has lack of transportation kept you from medical appointments or from getting medications?: No    Lack of Transportation (Non-Medical): No  Physical Activity: Not on file  Stress: Not on file  Social Connections: Not on file    Review of Systems: A 12 point ROS discussed and pertinent positives are indicated in the HPI above.  All other systems are negative.  Review of Systems  Vital Signs: LMP 10/26/2022   Physical Exam  Imaging: CT ABDOMEN PELVIS W CONTRAST  Result  Date: 11/22/2022 CLINICAL DATA:  Acute non localized abdominal pain. EXAM: CT ABDOMEN AND PELVIS WITH CONTRAST TECHNIQUE: Multidetector CT imaging of the abdomen and pelvis was performed using the standard protocol following bolus administration of intravenous contrast. RADIATION DOSE REDUCTION: This exam was performed according to the departmental dose-optimization program which includes automated exposure control, adjustment of the mA and/or kV according to patient size and/or use of iterative reconstruction technique. CONTRAST:  75mL OMNIPAQUE IOHEXOL 350 MG/ML SOLN COMPARISON:  10/15/2022 FINDINGS: Lower Chest: No acute findings. Hepatobiliary: No suspicious hepatic masses identified. Gallbladder is unremarkable. No evidence of biliary ductal dilatation. Pancreas:  No mass or inflammatory changes. Spleen: Within normal limits in size and appearance. Adrenals/Urinary Tract: No suspicious masses identified. No evidence of ureteral calculi or hydronephrosis. Stomach/Bowel: No evidence of obstruction, inflammatory process or abnormal fluid collections. Normal appendix visualized. Vascular/Lymphatic: No pathologically enlarged lymph nodes. No acute vascular findings. Reproductive:  No mass or other significant abnormality. Other:  None. Musculoskeletal:  No suspicious bone lesions identified. IMPRESSION: Negative. No acute findings or other significant abnormality. Electronically Signed   By: Danae Orleans M.D.   On: 11/22/2022 14:04    Labs:  CBC: Recent Labs    10/10/22 1724 11/22/22 1112  WBC 7.6 4.4  HGB 13.7 13.9  HCT 41.3 41.6  PLT 322 322    COAGS: No results for input(s): "INR", "APTT" in the last 8760 hours.  BMP: Recent Labs    10/10/22 1724 11/22/22 1112  NA 138 139  K 3.8 4.0  CL 101 104  CO2 25 24  GLUCOSE 119* 107*  BUN 10 11  CALCIUM 9.2 9.1  CREATININE 0.79 0.82  GFRNONAA >60 >60    LIVER FUNCTION TESTS: Recent Labs    10/10/22 1724 11/22/22 1112  BILITOT 0.9 0.4   AST 39 65*  ALT 121* 103*  ALKPHOS 124 146*  PROT 7.3 6.9  ALBUMIN 4.5 3.9    TUMOR MARKERS: No results for input(s): "AFPTM", "CEA", "CA199", "CHROMGRNA" in the last 8760 hours.  Assessment and Plan:  Abnormal liver enzymes: Rose Howard, 27 year old female, presents today to the [facility] Interventional Radiology department for an image-guided non-focal liver biopsy.  Risks and benefits of this procedure were discussed with the patient and/or patient's family including, but not limited to bleeding, infection, damage to adjacent structures or low yield requiring additional tests.  All of the questions were answered and there is agreement to proceed. She has been NPO. She does not take any blood-thinning medications. She is a full code.   Consent signed and in chart.   Thank you for this interesting consult.  I greatly enjoyed meeting [facility] and look forward to participating in their care.  A copy of  this report was sent to the requesting provider on this date.  Electronically Signed: Alwyn Ren, AGACNP-BC [phone] 11/25/2022, 1:18 PM   I spent a total of  30 Minutes   in face to face in clinical consultation, greater than 50% of which was counseling/coordinating care for non-focal liver biopsy.

## 2022-11-26 ENCOUNTER — Other Ambulatory Visit: Payer: Self-pay

## 2022-11-26 ENCOUNTER — Ambulatory Visit
Admission: RE | Admit: 2022-11-26 | Discharge: 2022-11-26 | Disposition: A | Discharge status: Home / Self Care | Payer: BC Managed Care – PPO | Source: Ambulatory Visit | Attending: Gastroenterology | Admitting: Gastroenterology

## 2022-11-26 DIAGNOSIS — R970 Elevated carcinoembryonic antigen [CEA]: Secondary | ICD-10-CM | POA: Diagnosis not present

## 2022-11-26 DIAGNOSIS — R748 Abnormal levels of other serum enzymes: Secondary | ICD-10-CM | POA: Insufficient documentation

## 2022-11-26 DIAGNOSIS — R7989 Other specified abnormal findings of blood chemistry: Secondary | ICD-10-CM

## 2022-11-26 DIAGNOSIS — R109 Unspecified abdominal pain: Secondary | ICD-10-CM | POA: Insufficient documentation

## 2022-11-26 DIAGNOSIS — Z01812 Encounter for preprocedural laboratory examination: Secondary | ICD-10-CM

## 2022-11-26 LAB — COMPREHENSIVE METABOLIC PANEL
ALT: 89 U/L — ABNORMAL HIGH (ref 0–44)
AST: 30 U/L (ref 15–41)
Albumin: 4.1 g/dL (ref 3.5–5.0)
Alkaline Phosphatase: 143 U/L — ABNORMAL HIGH (ref 38–126)
Anion gap: 7 (ref 5–15)
BUN: 14 mg/dL (ref 6–20)
CO2: 25 mmol/L (ref 22–32)
Calcium: 8.7 mg/dL — ABNORMAL LOW (ref 8.9–10.3)
Chloride: 103 mmol/L (ref 98–111)
Creatinine, Ser: 0.76 mg/dL (ref 0.44–1.00)
GFR, Estimated: >60 mL/min (ref >60)
Glucose, Bld: 96 mg/dL (ref 70–99)
Potassium: 3.8 mmol/L (ref 3.5–5.1)
Sodium: 135 mmol/L (ref 135–145)
Total Bilirubin: 0.6 mg/dL (ref <1.2)
Total Protein: 7.3 g/dL (ref 6.5–8.1)

## 2022-11-26 LAB — PROTIME-INR
INR: 0.9 (ref 0.8–1.2)
Prothrombin Time: 12.5 s (ref 11.4–15.2)

## 2022-11-26 LAB — CBC
HCT: 39.1 % (ref 36.0–46.0)
Hemoglobin: 13.3 g/dL (ref 12.0–15.0)
MCH: 29.8 pg (ref 26.0–34.0)
MCHC: 34 g/dL (ref 30.0–36.0)
MCV: 87.5 fL (ref 80.0–100.0)
Platelets: 298 10*3/uL (ref 150–400)
RBC: 4.47 MIL/uL (ref 3.87–5.11)
RDW: 12.6 % (ref 11.5–15.5)
WBC: 4.6 10*3/uL (ref 4.0–10.5)
nRBC: 0 % (ref 0.0–0.2)

## 2022-11-26 MED ORDER — OXYCODONE HCL 5 MG PO TABS
ORAL_TABLET | ORAL | Status: AC
  Filled 2022-11-26: qty 2

## 2022-11-26 MED ORDER — FENTANYL CITRATE (PF) 100 MCG/2ML IJ SOLN
INTRAMUSCULAR | Status: AC | PRN
  Administered 2022-11-26 (×2): 50 ug via INTRAVENOUS

## 2022-11-26 MED ORDER — SODIUM CHLORIDE 0.9 % IV SOLN: INTRAVENOUS | Status: DC | PRN

## 2022-11-26 MED ORDER — ONDANSETRON HCL 4 MG/2ML IJ SOLN
4.0000 mg | INTRAMUSCULAR | Status: DC | PRN
  Administered 2022-11-26: 4 mg via INTRAVENOUS

## 2022-11-26 MED ORDER — ONDANSETRON HCL 4 MG/2ML IJ SOLN
INTRAMUSCULAR | Status: AC
  Filled 2022-11-26: qty 2

## 2022-11-26 MED ORDER — MIDAZOLAM HCL 2 MG/2ML IJ SOLN
INTRAMUSCULAR | Status: AC
  Filled 2022-11-26: qty 4

## 2022-11-26 MED ORDER — ONDANSETRON HCL 4 MG/2ML IJ SOLN
INTRAMUSCULAR | Status: AC | PRN
  Administered 2022-11-26: 4 mg via INTRAVENOUS

## 2022-11-26 MED ORDER — LIDOCAINE HCL (PF) 1 % IJ SOLN
9.0000 mL | Freq: Once | INTRAMUSCULAR | Status: AC
  Administered 2022-11-26: 9 mL via INTRADERMAL
  Filled 2022-11-26: qty 10

## 2022-11-26 MED ORDER — HYDROMORPHONE HCL 1 MG/ML IJ SOLN
INTRAMUSCULAR | Status: AC
  Filled 2022-11-26: qty 0.5

## 2022-11-26 MED ORDER — HYDROMORPHONE HCL 1 MG/ML IJ SOLN
INTRAMUSCULAR | Status: AC | PRN
  Administered 2022-11-26: .5 mg via INTRAVENOUS

## 2022-11-26 MED ORDER — FENTANYL CITRATE (PF) 100 MCG/2ML IJ SOLN
INTRAMUSCULAR | Status: AC
  Filled 2022-11-26: qty 2

## 2022-11-26 MED ORDER — MIDAZOLAM HCL 2 MG/2ML IJ SOLN
INTRAMUSCULAR | Status: AC | PRN
  Administered 2022-11-26 (×2): 1 mg via INTRAVENOUS

## 2022-11-26 MED ORDER — OXYCODONE HCL 5 MG PO TABS
10.0000 mg | ORAL_TABLET | ORAL | Status: DC | PRN
  Administered 2022-11-26: 10 mg via ORAL

## 2022-11-26 NOTE — Progress Notes (Signed)
Resting without further complaints, less discomfort post meds given earlier. Vitals remain stable at this time.

## 2022-11-26 NOTE — Progress Notes (Signed)
Patient clinically stable post US Liver biopsy per Dr Milford Cage, tolerated well with biopsy, dozing post biopsy, as getting ready to come to recovery, began having  increasing right abdominal/shoulder pain as to 10/10, and nauseated. Spoke with Dr Milford Cage with Dilaudid 0.5 mg Iv as well as Zofran 4 mg IV  given as ordered., patient stating that pain has decreased to 3/10 at this time. Report given to Karsten Fells RN post procedure/specials/13

## 2022-11-26 NOTE — Discharge Instructions (Signed)
Liver Biopsy, Care After These instructions give you information about how to care for yourself after your procedure. Your health care provider may also give you more specific instructions. If you have problems or questions, contact your health care provider. What can I expect after the procedure? After your procedure, it is common to have:  Pain and soreness in the area where the biopsy was done.  Bruising around the area where the biopsy was done.  Sleepiness and fatigue for 1-2 days. Follow these instructions at home: Medicines  Take over-the-counter and prescription medicines only as told by your health care provider.  If you were prescribed an antibiotic medicine, take it as told by your health care provider. Do not stop taking the antibiotic even if you start to feel better.  Do not take medicines such as aspirin and ibuprofen unless your health care provider tells you to take them. These medicines thin your blood and can increase the risk of bleeding.  If you are taking prescription pain medicine, take actions to prevent or treat constipation. Your health care provider may recommend that you: ? Drink enough fluid to keep your urine pale yellow. ? Eat foods that are high in fiber, such as fresh fruits and vegetables, whole grains, and beans. ? Limit foods that are high in fat and processed sugars, such as fried or sweet foods. ? Take an over-the-counter or prescription medicine for constipation. Incision care ? Wash your hands with soap and water before you change your bandage (dressing). If soap and water are not available, use hand sanitizer. ? Change your bandage tomorrow after you shower and then remove the next day.  Check your incision area every day for signs of infection. Check for: ? Redness, swelling, or pain. ? Fluid or blood. ? Warmth. ? Pus or a bad smell. You may shower tomorrow  No lifting more than 5 lbs for 3 days  Return to your normal activities as told  by your health care provider.   Do not drive or use heavy machinery for 24hrs or while taking prescription pain medicine.  Do not play contact sports for 2 weeks after the procedure. General instructions   Do not drink alcohol in the first week after the procedure.  Have someone stay with you for at least 24 hours after the procedure.  It is your responsibility to obtain your test results. Ask your health care provider, or the department that is doing the test: ? When will my results be ready? ? How will I get my results? ? What are my treatment options? ? What other tests do I need? ? What are my next steps?  Keep all follow-up visits as told by your health care provider. This is important. Contact a health care provider if:  You have increased bleeding from an incision, resulting in more than a small spot of blood.  You have redness, swelling, or increasing pain in any incisions.  You notice a discharge or a bad smell coming from any of your incisions.  You have a fever or chills. Get help right away if:  You develop swelling, bloating, or pain in your abdomen.  You become dizzy or faint.  You develop a rash.  You have nausea or you vomit.  You faint, or you have shortness of breath or difficulty breathing.  You develop chest pain.  You have problems with your speech or vision.  You have trouble with your balance or moving your arms or legs. Summary  After the liver biopsy, it is common to have pain, soreness, and bruising in the area, as well as sleepiness and fatigue.  Take over-the-counter and prescription medicines only as told by your health care provider.  Follow instructions from your health care provider about how to care for your incision. Check the incision area daily for signs of infection. This information is not intended to replace advice given to you by your health care provider. Make sure you discuss any questions you have with your health care  provider. Document Released: 07/25/2004 Document Revised: 02/28/2018 Document Reviewed: 01/15/2017 Elsevier Patient Education  2020 [employer].

## 2022-11-26 NOTE — Procedures (Signed)
Vascular and Interventional Radiology Procedure Note  Patient: Rose Howard DOB: 10-15-1995 Medical Record Number: [id number] Note Date/Time: 11/26/22 10:21 AM   Performing Physician: Roanna Banning, MD Assistant(s): None  Diagnosis: >LFTs   Procedure: LIVER BIOPSY, NON-TARGETED  Anesthesia: Conscious Sedation Complications: None Estimated Blood Loss: Minimal Specimens: Sent for Pathology  Findings:  Successful Ultrasound-guided biopsy of liver. A total of 3 samples were obtained. Hemostasis of the tract was achieved using Gelfoam Slurry Embolization.  Plan: Bed rest for 2 hours.  See detailed procedure note with images in PACS. The patient tolerated the procedure well without incident or complication and was returned to Recovery in stable condition.    Roanna Banning, MD [facility]   Pager. [phone] Clinic. [phone]

## 2022-11-27 LAB — SURGICAL PATHOLOGY

## 2022-12-09 ENCOUNTER — Ambulatory Visit: Payer: BC Managed Care – PPO

## 2022-12-09 DIAGNOSIS — K2950 Unspecified chronic gastritis without bleeding: Secondary | ICD-10-CM | POA: Diagnosis not present

## 2022-12-09 DIAGNOSIS — R1084 Generalized abdominal pain: Secondary | ICD-10-CM | POA: Diagnosis not present

## 2022-12-09 DIAGNOSIS — K640 First degree hemorrhoids: Secondary | ICD-10-CM | POA: Diagnosis not present

## 2022-12-09 DIAGNOSIS — D123 Benign neoplasm of transverse colon: Secondary | ICD-10-CM | POA: Diagnosis present

## 2022-12-09 DIAGNOSIS — R110 Nausea: Secondary | ICD-10-CM | POA: Diagnosis not present

## 2023-01-05 ENCOUNTER — Encounter: Payer: Self-pay | Admitting: *Deleted

## 2023-01-05 ENCOUNTER — Other Ambulatory Visit: Payer: Self-pay

## 2023-01-05 DIAGNOSIS — R1030 Lower abdominal pain, unspecified: Secondary | ICD-10-CM | POA: Diagnosis present

## 2023-01-05 DIAGNOSIS — Z5321 Procedure and treatment not carried out due to patient leaving prior to being seen by health care provider: Secondary | ICD-10-CM | POA: Diagnosis not present

## 2023-01-05 DIAGNOSIS — M549 Dorsalgia, unspecified: Secondary | ICD-10-CM | POA: Insufficient documentation

## 2023-01-05 LAB — CBC WITH DIFFERENTIAL/PLATELET
Abs Immature Granulocytes: 0.03 10*3/uL (ref 0.00–0.07)
Basophils Absolute: 0.1 10*3/uL (ref 0.0–0.1)
Basophils Relative: 1 %
Eosinophils Absolute: 0.6 10*3/uL — ABNORMAL HIGH (ref 0.0–0.5)
Eosinophils Relative: 6 %
HCT: 44.7 % (ref 36.0–46.0)
Hemoglobin: 14.7 g/dL (ref 12.0–15.0)
Immature Granulocytes: 0 %
Lymphocytes Relative: 15 %
Lymphs Abs: 1.4 10*3/uL (ref 0.7–4.0)
MCH: 29.1 pg (ref 26.0–34.0)
MCHC: 32.9 g/dL (ref 30.0–36.0)
MCV: 88.5 fL (ref 80.0–100.0)
Monocytes Absolute: 0.5 10*3/uL (ref 0.1–1.0)
Monocytes Relative: 6 %
Neutro Abs: 6.8 10*3/uL (ref 1.7–7.7)
Neutrophils Relative %: 72 %
Platelets: 360 10*3/uL (ref 150–400)
RBC: 5.05 MIL/uL (ref 3.87–5.11)
RDW: 12.3 % (ref 11.5–15.5)
WBC: 9.5 10*3/uL (ref 4.0–10.5)
nRBC: 0 % (ref 0.0–0.2)

## 2023-01-05 LAB — COMPREHENSIVE METABOLIC PANEL
ALT: 86 U/L — ABNORMAL HIGH (ref 0–44)
AST: 49 U/L — ABNORMAL HIGH (ref 15–41)
Albumin: 4.5 g/dL (ref 3.5–5.0)
Alkaline Phosphatase: 129 U/L — ABNORMAL HIGH (ref 38–126)
Anion gap: 10 (ref 5–15)
BUN: 9 mg/dL (ref 6–20)
CO2: 25 mmol/L (ref 22–32)
Calcium: 9.2 mg/dL (ref 8.9–10.3)
Chloride: 102 mmol/L (ref 98–111)
Creatinine, Ser: 0.64 mg/dL (ref 0.44–1.00)
GFR, Estimated: >60 mL/min (ref >60)
Glucose, Bld: 99 mg/dL (ref 70–99)
Potassium: 3.9 mmol/L (ref 3.5–5.1)
Sodium: 137 mmol/L (ref 135–145)
Total Bilirubin: 0.7 mg/dL (ref <1.2)
Total Protein: 7.4 g/dL (ref 6.5–8.1)

## 2023-01-05 LAB — URINALYSIS, ROUTINE W REFLEX MICROSCOPIC
Bacteria, UA: NONE SEEN
Bilirubin Urine: NEGATIVE
Glucose, UA: NEGATIVE mg/dL
Hgb urine dipstick: NEGATIVE
Ketones, ur: NEGATIVE mg/dL
Leukocytes,Ua: NEGATIVE
Nitrite: NEGATIVE
Protein, ur: NEGATIVE mg/dL
RBC / HPF: 0 RBC/hpf (ref 0–5)
Specific Gravity, Urine: 1.01 (ref 1.005–1.030)
pH: 5 (ref 5.0–8.0)

## 2023-01-05 LAB — PREGNANCY, URINE: Preg Test, Ur: NEGATIVE

## 2023-01-05 LAB — POC URINE PREG, ED: Preg Test, Ur: NEGATIVE

## 2023-01-05 LAB — LIPASE, BLOOD: Lipase: 27 U/L (ref 11–51)

## 2023-01-05 NOTE — ED Triage Notes (Signed)
Pt ambulatory to triage.  Pt has lower abd pain.  No v/d  no vag bleeding or discharge.  Denies urinary sx .   Pt took hycosimine without relief.    Pt alert  speech clear.

## 2023-01-05 NOTE — ED Provider Triage Note (Signed)
Emergency Medicine Provider Triage Evaluation Note  [name] , a 27 y.o. female  was evaluated in triage.  Pt complains of severe lower abdominal pain that began today around 11. She took hycosamine at home which did not help.  Review of Systems  Positive: Lower abdominal pain, back pain Negative: V/d, vaginal bleeding, vaginal discharge, urinary symptoms  Physical Exam  There were no vitals taken for this visit. Gen:   Awake, no distress   Resp:  Normal effort  MSK:   Moves extremities without difficulty  Other:    Medical Decision Making  Medically screening exam initiated at 4:45 PM.  Appropriate orders placed.  MAZELL LUJAN was informed that the remainder of the evaluation will be completed by another provider, this initial triage assessment does not replace that evaluation, and the importance of remaining in the ED until their evaluation is complete.     Cameron Ali, PA-C 01/05/23 1646

## 2023-01-06 ENCOUNTER — Emergency Department
Admission: EM | Admit: 2023-01-06 | Discharge: 2023-01-06 | Discharge status: Against Medical Advice | Payer: BC Managed Care – PPO | Attending: Student | Admitting: Student

## 2023-01-06 NOTE — ED Notes (Signed)
No answer when called several times from lobby 

## 2023-01-07 ENCOUNTER — Emergency Department
Admission: EM | Admit: 2023-01-07 | Discharge: 2023-01-07 | Disposition: A | Discharge status: Home / Self Care | Payer: BC Managed Care – PPO | Attending: Emergency Medicine | Admitting: Emergency Medicine

## 2023-01-07 ENCOUNTER — Other Ambulatory Visit: Payer: Self-pay

## 2023-01-07 ENCOUNTER — Encounter: Payer: Self-pay | Admitting: Emergency Medicine

## 2023-01-07 DIAGNOSIS — E860 Dehydration: Secondary | ICD-10-CM | POA: Insufficient documentation

## 2023-01-07 DIAGNOSIS — E876 Hypokalemia: Secondary | ICD-10-CM | POA: Diagnosis not present

## 2023-01-07 DIAGNOSIS — R112 Nausea with vomiting, unspecified: Secondary | ICD-10-CM | POA: Insufficient documentation

## 2023-01-07 DIAGNOSIS — R1031 Right lower quadrant pain: Secondary | ICD-10-CM | POA: Diagnosis not present

## 2023-01-07 DIAGNOSIS — R1032 Left lower quadrant pain: Secondary | ICD-10-CM | POA: Diagnosis not present

## 2023-01-07 DIAGNOSIS — R197 Diarrhea, unspecified: Secondary | ICD-10-CM | POA: Insufficient documentation

## 2023-01-07 DIAGNOSIS — Z20822 Contact with and (suspected) exposure to covid-19: Secondary | ICD-10-CM | POA: Insufficient documentation

## 2023-01-07 LAB — URINALYSIS, ROUTINE W REFLEX MICROSCOPIC
Bilirubin Urine: NEGATIVE
Glucose, UA: NEGATIVE mg/dL
Hgb urine dipstick: NEGATIVE
Ketones, ur: 5 mg/dL — AB
Leukocytes,Ua: NEGATIVE
Nitrite: NEGATIVE
Protein, ur: NEGATIVE mg/dL
Specific Gravity, Urine: 1.012 (ref 1.005–1.030)
pH: 6 (ref 5.0–8.0)

## 2023-01-07 LAB — COMPREHENSIVE METABOLIC PANEL
ALT: 65 U/L — ABNORMAL HIGH (ref 0–44)
AST: 33 U/L (ref 15–41)
Albumin: 4.3 g/dL (ref 3.5–5.0)
Alkaline Phosphatase: 119 U/L (ref 38–126)
Anion gap: 13 (ref 5–15)
BUN: 12 mg/dL (ref 6–20)
CO2: 22 mmol/L (ref 22–32)
Calcium: 9.2 mg/dL (ref 8.9–10.3)
Chloride: 103 mmol/L (ref 98–111)
Creatinine, Ser: 0.76 mg/dL (ref 0.44–1.00)
GFR, Estimated: >60 mL/min (ref >60)
Glucose, Bld: 113 mg/dL — ABNORMAL HIGH (ref 70–99)
Potassium: 3.2 mmol/L — ABNORMAL LOW (ref 3.5–5.1)
Sodium: 138 mmol/L (ref 135–145)
Total Bilirubin: 0.5 mg/dL (ref <1.2)
Total Protein: 7.4 g/dL (ref 6.5–8.1)

## 2023-01-07 LAB — CBC
HCT: 43.7 % (ref 36.0–46.0)
Hemoglobin: 14.9 g/dL (ref 12.0–15.0)
MCH: 29.2 pg (ref 26.0–34.0)
MCHC: 34.1 g/dL (ref 30.0–36.0)
MCV: 85.7 fL (ref 80.0–100.0)
Platelets: 396 10*3/uL (ref 150–400)
RBC: 5.1 MIL/uL (ref 3.87–5.11)
RDW: 12.4 % (ref 11.5–15.5)
WBC: 9.9 10*3/uL (ref 4.0–10.5)
nRBC: 0 % (ref 0.0–0.2)

## 2023-01-07 LAB — RESP PANEL BY RT-PCR (RSV, FLU A&B, COVID)  RVPGX2
Influenza A by PCR: NEGATIVE
Influenza B by PCR: NEGATIVE
Resp Syncytial Virus by PCR: NEGATIVE
SARS Coronavirus 2 by RT PCR: NEGATIVE

## 2023-01-07 LAB — HCG, QUANTITATIVE, PREGNANCY: hCG, Beta Chain, Quant, S: <1 m[IU]/mL (ref <5)

## 2023-01-07 LAB — LIPASE, BLOOD: Lipase: 62 U/L — ABNORMAL HIGH (ref 11–51)

## 2023-01-07 MED ORDER — POTASSIUM CHLORIDE CRYS ER 20 MEQ PO TBCR
40.0000 meq | EXTENDED_RELEASE_TABLET | Freq: Once | ORAL | Status: AC
  Administered 2023-01-07: 40 meq via ORAL
  Filled 2023-01-07: qty 2

## 2023-01-07 MED ORDER — MECLIZINE HCL 25 MG PO TABS
25.0000 mg | ORAL_TABLET | Freq: Once | ORAL | Status: AC
  Administered 2023-01-07: 25 mg via ORAL
  Filled 2023-01-07: qty 1

## 2023-01-07 MED ORDER — ONDANSETRON HCL 4 MG/2ML IJ SOLN
4.0000 mg | Freq: Once | INTRAMUSCULAR | Status: AC
  Administered 2023-01-07: 4 mg via INTRAVENOUS
  Filled 2023-01-07: qty 2

## 2023-01-07 MED ORDER — SODIUM CHLORIDE 0.9 % IV BOLUS
1000.0000 mL | Freq: Once | INTRAVENOUS | Status: AC
  Administered 2023-01-07: 1000 mL via INTRAVENOUS

## 2023-01-07 NOTE — Discharge Instructions (Signed)
You are seen in the emergency department for abdominal pain with nausea, vomiting and diarrhea.  You had mildly low potassium and appeared slightly dehydrated on your lab work today.  You got IV fluids and nausea medication and some potassium which improved your symptoms.  It is importantly stay hydrated and drink plenty of fluids.  Use your nausea medications at home as prescribed.  Follow-up closely with your primary care physician for ongoing workup for your ongoing abdominal pain.  Return for any worsening symptoms.  Your potassium was mildly low when checked today.  Make sure to follow up with a primary doctor to follow up your labs.  Make sure to eat food high in potassium and magnesium - examples - potatoes, spinach, bananas, beans, avocadoes, oranges, nuts.  Thank you for choosing [place] for your health care, it was my pleasure to care for you today!  Corena Herter, MD

## 2023-01-07 NOTE — ED Provider Notes (Signed)
[facility] Provider Note    Event Date/Time   First MD Initiated Contact with Patient 01/07/23 [phone]     (approximate)   History   Emesis   HPI  Rose Howard is a 27 y.o. female past medical history of chronic abdominal pain with multiple liver biopsies obtained, who presents to the emergency department with abdominal pain.  States that she is concerned that she might of gotten a GI bug from her children who have been sick.  Endorses 2 to 3 days of nausea, vomiting and significant diarrhea.  Denies any blood in the stool or bloody diarrhea.  No fever.  Abdominal cramping that is worse in the lower abdomen.  Endorses a sharp pain that is intermittent.  No dysuria, urinary urgency or frequency.  Denies recent antibiotic use.  No prior abdominal surgery other than liver biopsy.  Does endorse marijuana use but states that she has not been smoking marijuana recently secondary to her abdominal issues to see if that improves her symptoms.  Endorses dizziness and feels like the room is spinning.  Denies any falls or head trauma.  Denies any change in vision.  Denies any significant alcohol use.  Intermittent NSAID use.     Physical Exam   Triage Vital Signs: ED Triage Vitals  Encounter Vitals Group     BP 01/07/23 0852 (!) 117/92     Systolic BP Percentile --      Diastolic BP Percentile --      Pulse Rate 01/07/23 0852 80     Resp 01/07/23 0852 18     Temp 01/07/23 0852 97.9 F (36.6 C)     Temp Source 01/07/23 0852 Oral     SpO2 01/07/23 0852 100 %     Weight 01/07/23 0850 170 lb (77.1 kg)     Height 01/07/23 0850 5\' 2"  (1.575 m)     Head Circumference --      Peak Flow --      Pain Score 01/07/23 0850 0     Pain Loc --      Pain Education --      Exclude from Growth Chart --     Most recent vital signs: Vitals:   01/07/23 0852 01/07/23 1250  BP: (!) 117/92 112/78  Pulse: 80 74  Resp: 18 18  Temp: 97.9 F (36.6 C) 97.7 F (36.5 C)  SpO2: 100%  100%    Physical Exam Constitutional:      Appearance: She is well-developed.  HENT:     Head: Atraumatic.  Eyes:     Conjunctiva/sclera: Conjunctivae normal.  Cardiovascular:     Rate and Rhythm: Regular rhythm.  Pulmonary:     Effort: No respiratory distress.  Abdominal:     General: There is no distension.     Tenderness: There is abdominal tenderness (Mild lower abdominal tenderness to palpation that is bilateral.  Negative Murphy sign.  Negative McBurney's point.  No CVA tenderness.).  Musculoskeletal:        General: Normal range of motion.     Cervical back: Normal range of motion.  Skin:    General: Skin is warm.     Capillary Refill: Capillary refill takes less than 2 seconds.  Neurological:     Mental Status: She is alert. Mental status is at baseline.     IMPRESSION / MDM / ASSESSMENT AND PLAN / ED COURSE  I reviewed the triage vital signs and the nursing notes.  Differential  diagnosis including viral gastroenteritis, COVID/influenza, dehydration, electrolyte abnormality, vertigo, IBS, IBD, pyelonephritis, kidney stone.  On chart review multiple CT scans and ultrasounds of her right upper quadrant with no signs of gallstones and normal common bile duct.  Most recent CT scan done within the past 1 month.   LABS (all labs ordered are listed, but only abnormal results are displayed) Labs interpreted as -    Labs Reviewed  LIPASE, BLOOD - Abnormal; Notable for the following components:      Result Value   Lipase 62 (*)    All other components within normal limits  COMPREHENSIVE METABOLIC PANEL - Abnormal; Notable for the following components:   Potassium 3.2 (*)    Glucose, Bld 113 (*)    ALT 65 (*)    All other components within normal limits  URINALYSIS, ROUTINE W REFLEX MICROSCOPIC - Abnormal; Notable for the following components:   Color, Urine YELLOW (*)    APPearance CLOUDY (*)    Ketones, ur 5 (*)    All other components within normal limits  RESP  PANEL BY RT-PCR (RSV, FLU A&B, COVID)  RVPGX2  CBC  HCG, QUANTITATIVE, PREGNANCY  POC URINE PREG, ED     MDM    Patient was given IV fluids and antiemetics.  Given IV ketorolac.  Pregnancy test is negative.  Low suspicion for acute cholecystitis or symptomatic cholelithiasis.  Bedside gallbladder ultrasound with no findings of gallstones or acute cholecystitis.  Patient with ongoing dizziness and feeling like the room is spinning so we will give a trial of meclizine.  On reevaluation states she is feeling much better, no longer with any dizziness.  Orthostatic blood pressures are negative.  Tolerating p.o.  Patient was given oral potassium.  On repeat abdominal exam no focal abdominal tenderness to palpation.  States she is feeling much better.  Most likely with viral gastroenteritis.  Discussed symptomatic treatment.  Discussed return precautions and follow-up with her primary care physician.   PROCEDURES:  Critical Care performed: No  Ultrasound ED Abd  Date/Time: 01/07/2023 10:56 AM  Performed by: Corena Herter, MD Authorized by: Corena Herter, MD   Procedure details:    Indications: abdominal pain     Assessment for:  Gallstones   Aorta:  Not visualized   Left renal:  Not visualized   Right renal:  Not visualized   Hepatobiliary:  Visualized   Bladder:  Visualized    Images: not archived   Study Limitations: body habitus Hepatobiliary findings:    Common bile duct:  Unable to visualize   Gallbladder wall:  Normal   Gallbladder stones: not identified     Mass: not identified     Intra-abdominal fluid: not identified     Sonographic Murphy's sign: negative   Bladder findings:    Free pelvic fluid: not identified     Patient's presentation is most consistent with acute presentation with potential threat to life or bodily function.   MEDICATIONS ORDERED IN ED: Medications  sodium chloride 0.9 % bolus 1,000 mL (0 mLs Intravenous Stopped 01/07/23 1053)   ondansetron (ZOFRAN) injection 4 mg (4 mg Intravenous Given 01/07/23 0937)  meclizine (ANTIVERT) tablet 25 mg (25 mg Oral Given 01/07/23 1053)  potassium chloride SA (KLOR-CON M) CR tablet 40 mEq (40 mEq Oral Given 01/07/23 1223)    FINAL CLINICAL IMPRESSION(S) / ED DIAGNOSES   Final diagnoses:  Dehydration  Nausea vomiting and diarrhea  Hypokalemia     Rx / DC Orders   ED  Discharge Orders     None        Note:  This document was prepared using Dragon voice recognition software and may include unintentional dictation errors.   Corena Herter, MD 01/07/23 [phone]

## 2023-01-07 NOTE — ED Triage Notes (Signed)
Pt via POV from home. Pt c/o NVD and intermittent abd cramps for the past 3-4 days, reports that her family has had a stomach virus but not as bad as hers. Pt is A&Ox4 and NAD.

## 2023-01-18 ENCOUNTER — Other Ambulatory Visit: Payer: Self-pay | Admitting: Family Medicine

## 2023-01-18 ENCOUNTER — Ambulatory Visit: Payer: BC Managed Care – PPO

## 2023-01-18 DIAGNOSIS — R102 Pelvic and perineal pain: Secondary | ICD-10-CM

## 2023-01-19 ENCOUNTER — Ambulatory Visit
Admission: RE | Admit: 2023-01-19 | Discharge: 2023-01-19 | Disposition: A | Discharge status: Home / Self Care | Payer: BC Managed Care – PPO | Source: Ambulatory Visit | Attending: Family Medicine | Admitting: Family Medicine

## 2023-01-19 DIAGNOSIS — R102 Pelvic and perineal pain: Secondary | ICD-10-CM | POA: Insufficient documentation

## 2023-01-20 ENCOUNTER — Emergency Department
Admission: EM | Admit: 2023-01-20 | Discharge: 2023-01-20 | Disposition: A | Discharge status: Home / Self Care | Payer: BC Managed Care – PPO | Attending: Emergency Medicine | Admitting: Emergency Medicine

## 2023-01-20 ENCOUNTER — Other Ambulatory Visit: Payer: Self-pay

## 2023-01-20 DIAGNOSIS — R112 Nausea with vomiting, unspecified: Secondary | ICD-10-CM | POA: Diagnosis present

## 2023-01-20 DIAGNOSIS — K529 Noninfective gastroenteritis and colitis, unspecified: Secondary | ICD-10-CM | POA: Insufficient documentation

## 2023-01-20 LAB — CBC
HCT: 44.6 % (ref 36.0–46.0)
Hemoglobin: 15 g/dL (ref 12.0–15.0)
MCH: 29 pg (ref 26.0–34.0)
MCHC: 33.6 g/dL (ref 30.0–36.0)
MCV: 86.3 fL (ref 80.0–100.0)
Platelets: 376 10*3/uL (ref 150–400)
RBC: 5.17 MIL/uL — ABNORMAL HIGH (ref 3.87–5.11)
RDW: 12.3 % (ref 11.5–15.5)
WBC: 14.2 10*3/uL — ABNORMAL HIGH (ref 4.0–10.5)
nRBC: 0 % (ref 0.0–0.2)

## 2023-01-20 LAB — LIPASE, BLOOD: Lipase: 25 U/L (ref 11–51)

## 2023-01-20 LAB — COMPREHENSIVE METABOLIC PANEL
ALT: 104 U/L — ABNORMAL HIGH (ref 0–44)
AST: 56 U/L — ABNORMAL HIGH (ref 15–41)
Albumin: 4.6 g/dL (ref 3.5–5.0)
Alkaline Phosphatase: 116 U/L (ref 38–126)
Anion gap: 11 (ref 5–15)
BUN: 18 mg/dL (ref 6–20)
CO2: 21 mmol/L — ABNORMAL LOW (ref 22–32)
Calcium: 9.2 mg/dL (ref 8.9–10.3)
Chloride: 103 mmol/L (ref 98–111)
Creatinine, Ser: 0.81 mg/dL (ref 0.44–1.00)
GFR, Estimated: >60 mL/min (ref >60)
Glucose, Bld: 114 mg/dL — ABNORMAL HIGH (ref 70–99)
Potassium: 4 mmol/L (ref 3.5–5.1)
Sodium: 135 mmol/L (ref 135–145)
Total Bilirubin: 1.1 mg/dL (ref 0.0–1.2)
Total Protein: 7.9 g/dL (ref 6.5–8.1)

## 2023-01-20 LAB — HCG, QUANTITATIVE, PREGNANCY: hCG, Beta Chain, Quant, S: <1 m[IU]/mL (ref <5)

## 2023-01-20 MED ORDER — PANTOPRAZOLE SODIUM 40 MG IV SOLR
40.0000 mg | Freq: Once | INTRAVENOUS | Status: AC
  Administered 2023-01-20: 40 mg via INTRAVENOUS
  Filled 2023-01-20: qty 10

## 2023-01-20 MED ORDER — ONDANSETRON HCL 4 MG/2ML IJ SOLN
4.0000 mg | Freq: Once | INTRAMUSCULAR | Status: AC
  Administered 2023-01-20: 4 mg via INTRAVENOUS
  Filled 2023-01-20: qty 2

## 2023-01-20 MED ORDER — SODIUM CHLORIDE 0.9 % IV BOLUS
1000.0000 mL | Freq: Once | INTRAVENOUS | Status: AC
  Administered 2023-01-20: 1000 mL via INTRAVENOUS

## 2023-01-20 MED ORDER — KETOROLAC TROMETHAMINE 15 MG/ML IJ SOLN
15.0000 mg | Freq: Once | INTRAMUSCULAR | Status: AC
  Administered 2023-01-20: 15 mg via INTRAVENOUS
  Filled 2023-01-20: qty 1

## 2023-01-20 MED ORDER — PROMETHAZINE HCL 12.5 MG RE SUPP: 12.5000 mg | Freq: Four times a day (QID) | RECTAL | 0 refills | Status: DC | PRN

## 2023-01-20 NOTE — ED Notes (Signed)
 Pt able to tolerate PO food and water

## 2023-01-20 NOTE — ED Triage Notes (Signed)
 Pt states that she had a stomach bug a few weeks ago and it was going away and then symptoms started coming back in the middle of the night, unable to keep any po down

## 2023-01-20 NOTE — Discharge Instructions (Addendum)
 Continue take your Zofran  to help with any nausea.  I would try to stay on top of it and take another Zofran  before dinnertime.  Drink Pedialyte and Gatorade without sugar to stay hydrated.  Return to the ER if develop worsening symptoms.  We discussed CT imaging of opted to hold off but if you develop any right lower quadrant pain then please return for repeat evaluation.  You can follow-up with your primary care doctor for repeat abdominal exam as well tomorrow as needed.

## 2023-01-20 NOTE — ED Provider Notes (Signed)
 [facility] Provider Note    Event Date/Time   First MD Initiated Contact with Patient 01/20/23 1336     (approximate)   History   Emesis and Nausea   HPI  Rose Howard is a 28 y.o. female with history of abnormal LFTs, post multiple liver biopsies who comes in with concerns for abdominal pain.  On review of records patient did have a CT scan done 2 months ago that was reassuring.  I reviewed a telephone encounter where stated the patient has had mildly elevated liver enzymes going back to 2018.  She also had a formal ultrasound done in October 2024 that did not show any gallstones and had a ultrasound done on her last ER visit here bedside.  Patient reports overnight she had developed return of nausea vomiting diarrhea.  She denies eating any new foods or anyone else being sick.  She reports that she tried her Zofran  was still unable to tolerate p.o. and did take some Phenergan .  She reports that she has a little bit of upper abdominal discomfort but no lower pain at this time.  She is currently on Humira for her psoriatic arthritis.  Physical Exam   Triage Vital Signs: ED Triage Vitals  Encounter Vitals Group     BP 01/20/23 1153 121/84     Systolic BP Percentile --      Diastolic BP Percentile --      Pulse Rate 01/20/23 1153 80     Resp 01/20/23 1153 18     Temp 01/20/23 1153 98.4 F (36.9 C)     Temp src --      SpO2 01/20/23 1153 98 %     Weight --      Height --      Head Circumference --      Peak Flow --      Pain Score 01/20/23 1154 1     Pain Loc --      Pain Education --      Exclude from Growth Chart --     Most recent vital signs: Vitals:   01/20/23 1153  BP: 121/84  Pulse: 80  Resp: 18  Temp: 98.4 F (36.9 C)  SpO2: 98%     General: Awake, no distress.  CV:  Good peripheral perfusion.  Resp:  Normal effort.  Abd:  No distention.  Soft and nontender without any rebound or guarding Other:     ED Results /  Procedures / Treatments   Labs (all labs ordered are listed, but only abnormal results are displayed) Labs Reviewed  CBC - Abnormal; Notable for the following components:      Result Value   WBC 14.2 (*)    RBC 5.17 (*)    All other components within normal limits  COMPREHENSIVE METABOLIC PANEL - Abnormal; Notable for the following components:   CO2 21 (*)    Glucose, Bld 114 (*)    AST 56 (*)    ALT 104 (*)    All other components within normal limits  LIPASE, BLOOD  URINALYSIS, ROUTINE W REFLEX MICROSCOPIC    PROCEDURES:  Critical Care performed: No  Procedures   MEDICATIONS ORDERED IN ED: Medications - No data to display   IMPRESSION / MDM / ASSESSMENT AND PLAN / ED COURSE  I reviewed the triage vital signs and the nursing notes.   Patient's presentation is most consistent with acute presentation with potential threat to life or bodily function.  Patient comes in with nausea vomiting diarrhea suspect most likely gastroenteritis.  Abdomen at this time is soft and nontender.  Discussed CT imaging of opted to hold off at this time given no right lower quadrant pain to suggest appendicitis.  She just had ultrasound done yesterday to evaluate her ovaries will try to get a read on this but given no significant lower abdominal pain at this time.  She has had ultrasounds previously without signs of gallstones.  This seems most consistent with gastroenteritis will treat symptomatically.   Lipase was normal CBC shows elevated white count CMP shows slightly elevated LFTs similar to her priors.  I did call to get patient's pelvic ultrasound read from yesterday and this was negative.  Repeat assessment patient is feeling much better.  P.o. challenge was done.  Patient's abdomen has no right lower quadrant pain to suggest appendicitis.  We discussed CT imaging and she is agreeable to holding off at this time will return if she develops worsening pain but will trial symptomatic  treatment at home.  She already has Zofran .  Will trial some rectal Phenergan .  She can return to the ER if develops worsening symptoms or any other concerns   FINAL CLINICAL IMPRESSION(S) / ED DIAGNOSES   Final diagnoses:  Gastroenteritis     Rx / DC Orders   ED Discharge Orders          Ordered    promethazine  (PHENERGAN ) 12.5 MG suppository  Every 6 hours PRN        01/20/23 1452             Note:  This document was prepared using Dragon voice recognition software and may include unintentional dictation errors.   Ernest Ronal BRAVO, MD 01/20/23 [phone]

## 2023-02-02 ENCOUNTER — Emergency Department: Payer: BC Managed Care – PPO

## 2023-02-02 ENCOUNTER — Emergency Department
Admission: EM | Admit: 2023-02-02 | Discharge: 2023-02-02 | Disposition: A | Discharge status: Home / Self Care | Payer: BC Managed Care – PPO | Attending: Emergency Medicine | Admitting: Emergency Medicine

## 2023-02-02 ENCOUNTER — Other Ambulatory Visit: Payer: Self-pay

## 2023-02-02 DIAGNOSIS — Z20822 Contact with and (suspected) exposure to covid-19: Secondary | ICD-10-CM | POA: Insufficient documentation

## 2023-02-02 DIAGNOSIS — R051 Acute cough: Secondary | ICD-10-CM

## 2023-02-02 DIAGNOSIS — R059 Cough, unspecified: Secondary | ICD-10-CM | POA: Diagnosis present

## 2023-02-02 DIAGNOSIS — R1030 Lower abdominal pain, unspecified: Secondary | ICD-10-CM | POA: Insufficient documentation

## 2023-02-02 DIAGNOSIS — J101 Influenza due to other identified influenza virus with other respiratory manifestations: Secondary | ICD-10-CM | POA: Insufficient documentation

## 2023-02-02 DIAGNOSIS — R0602 Shortness of breath: Secondary | ICD-10-CM

## 2023-02-02 DIAGNOSIS — M791 Myalgia, unspecified site: Secondary | ICD-10-CM

## 2023-02-02 LAB — RESP PANEL BY RT-PCR (RSV, FLU A&B, COVID)  RVPGX2
Influenza A by PCR: POSITIVE — AB
Influenza B by PCR: NEGATIVE
Resp Syncytial Virus by PCR: NEGATIVE
SARS Coronavirus 2 by RT PCR: NEGATIVE

## 2023-02-02 LAB — URINALYSIS, ROUTINE W REFLEX MICROSCOPIC
Bilirubin Urine: NEGATIVE
Glucose, UA: NEGATIVE mg/dL
Hgb urine dipstick: NEGATIVE
Ketones, ur: NEGATIVE mg/dL
Leukocytes,Ua: NEGATIVE
Nitrite: NEGATIVE
Protein, ur: NEGATIVE mg/dL
Specific Gravity, Urine: 1.018 (ref 1.005–1.030)
pH: 7 (ref 5.0–8.0)

## 2023-02-02 LAB — COMPREHENSIVE METABOLIC PANEL
ALT: 73 U/L — ABNORMAL HIGH (ref 0–44)
AST: 31 U/L (ref 15–41)
Albumin: 3.9 g/dL (ref 3.5–5.0)
Alkaline Phosphatase: 178 U/L — ABNORMAL HIGH (ref 38–126)
Anion gap: 8 (ref 5–15)
BUN: 10 mg/dL (ref 6–20)
CO2: 25 mmol/L (ref 22–32)
Calcium: 8.5 mg/dL — ABNORMAL LOW (ref 8.9–10.3)
Chloride: 103 mmol/L (ref 98–111)
Creatinine, Ser: 0.74 mg/dL (ref 0.44–1.00)
GFR, Estimated: >60 mL/min (ref >60)
Glucose, Bld: 107 mg/dL — ABNORMAL HIGH (ref 70–99)
Potassium: 3.7 mmol/L (ref 3.5–5.1)
Sodium: 136 mmol/L (ref 135–145)
Total Bilirubin: 0.3 mg/dL (ref 0.0–1.2)
Total Protein: 6.9 g/dL (ref 6.5–8.1)

## 2023-02-02 LAB — CBC
HCT: 38.7 % (ref 36.0–46.0)
Hemoglobin: 13.1 g/dL (ref 12.0–15.0)
MCH: 28.9 pg (ref 26.0–34.0)
MCHC: 33.9 g/dL (ref 30.0–36.0)
MCV: 85.2 fL (ref 80.0–100.0)
Platelets: 231 10*3/uL (ref 150–400)
RBC: 4.54 MIL/uL (ref 3.87–5.11)
RDW: 12.1 % (ref 11.5–15.5)
WBC: 6.7 10*3/uL (ref 4.0–10.5)
nRBC: 0 % (ref 0.0–0.2)

## 2023-02-02 LAB — LIPASE, BLOOD: Lipase: 33 U/L (ref 11–51)

## 2023-02-02 LAB — POC URINE PREG, ED: Preg Test, Ur: NEGATIVE

## 2023-02-02 MED ORDER — ALBUTEROL SULFATE (2.5 MG/3ML) 0.083% IN NEBU
2.5000 mg | INHALATION_SOLUTION | Freq: Once | RESPIRATORY_TRACT | Status: DC
Start: 2023-02-02 — End: 2023-02-02

## 2023-02-02 MED ORDER — ACETAMINOPHEN 500 MG PO TABS
1000.0000 mg | ORAL_TABLET | Freq: Once | ORAL | Status: AC
  Administered 2023-02-02: 1000 mg via ORAL
  Filled 2023-02-02: qty 2

## 2023-02-02 MED ORDER — IOHEXOL 300 MG/ML  SOLN
100.0000 mL | Freq: Once | INTRAMUSCULAR | Status: AC | PRN
  Administered 2023-02-02: 100 mL via INTRAVENOUS

## 2023-02-02 MED ORDER — ALBUTEROL SULFATE (2.5 MG/3ML) 0.083% IN NEBU: 3.0000 mL | INHALATION_SOLUTION | Freq: Once | RESPIRATORY_TRACT | Status: DC

## 2023-02-02 MED ORDER — ALBUTEROL SULFATE HFA 108 (90 BASE) MCG/ACT IN AERS
2.0000 | INHALATION_SPRAY | Freq: Once | RESPIRATORY_TRACT | Status: AC
  Administered 2023-02-02: 2 via RESPIRATORY_TRACT
  Filled 2023-02-02: qty 6.7

## 2023-02-02 MED ORDER — LACTATED RINGERS IV BOLUS
1000.0000 mL | Freq: Once | INTRAVENOUS | Status: AC
  Administered 2023-02-02: 1000 mL via INTRAVENOUS

## 2023-02-02 NOTE — ED Notes (Signed)
 Pt reports that she has been having s/s of flu for the past week, states that her husband had the flu, pt states that she is now having mid back pain, pt also states that she started having lower abd, hurts to strain to void and have bm's, no bowel sounds auscultated with assessment, hx of some abnormal bowel issues without diagnosis states possible ibs

## 2023-02-02 NOTE — ED Provider Notes (Addendum)
 [facility] Provider Note    Event Date/Time   First MD Initiated Contact with Patient 02/02/23 [phone]     (approximate)   History   Abdominal Pain   HPI Rose Howard is a 28 y.o. female history of psoriatic arthritis on Humira presenting with lower quadrant abdominal pain for the past day, also with about a week of congestion, cough, feeling little bit short of breath, with some chest wall tenderness especially after coughing.  States husband has influenza but is feeling better now.  States pain is crampy, constant, worse when she is coughing.  Noted some nausea vomiting earlier but no diarrhea.  Denies any vaginal discharge or bleeding, no dysuria or hematuria.  Denies prior abdominal surgeries.  On independent chart review patient had a complete ultrasound of her pelvis in December that was reported to be normal.     Physical Exam   Triage Vital Signs: ED Triage Vitals  Encounter Vitals Group     BP 02/02/23 0503 (!) 116/96     Systolic BP Percentile --      Diastolic BP Percentile --      Pulse Rate 02/02/23 0503 93     Resp 02/02/23 0503 18     Temp 02/02/23 0503 98.6 F (37 C)     Temp Source 02/02/23 0503 Oral     SpO2 02/02/23 0503 97 %     Weight 02/02/23 0502 170 lb (77.1 kg)     Height 02/02/23 0502 5' 2 (1.575 m)     Head Circumference --      Peak Flow --      Pain Score 02/02/23 0502 6     Pain Loc --      Pain Education --      Exclude from Growth Chart --     Most recent vital signs: Vitals:   02/02/23 0503 02/02/23 0831  BP: (!) 116/96 131/83  Pulse: 93 86  Resp: 18   Temp: 98.6 F (37 C) 99.5 F (37.5 C)  SpO2: 97% 96%    General: Awake, no distress.  CV:  Good peripheral perfusion.  Resp:  Normal effort.  Clear to auscultation Abd:  No distention.  Mild tenderness to the lower quadrants, no rebound guarding, no right upper quadrant tenderness. Other:  No jaundice, moves all 4 extremities   ED Results /  Procedures / Treatments   Labs (all labs ordered are listed, but only abnormal results are displayed) Labs Reviewed  RESP PANEL BY RT-PCR (RSV, FLU A&B, COVID)  RVPGX2 - Abnormal; Notable for the following components:      Result Value   Influenza A by PCR POSITIVE (*)    All other components within normal limits  COMPREHENSIVE METABOLIC PANEL - Abnormal; Notable for the following components:   Glucose, Bld 107 (*)    Calcium 8.5 (*)    ALT 73 (*)    Alkaline Phosphatase 178 (*)    All other components within normal limits  URINALYSIS, ROUTINE W REFLEX MICROSCOPIC - Abnormal; Notable for the following components:   Color, Urine YELLOW (*)    APPearance HAZY (*)    All other components within normal limits  LIPASE, BLOOD  CBC  POC URINE PREG, ED     EKG  EKG showed sinus rhythm, rate 77, normal QRS, normal QTc, T wave inversion to V1, no ischemic ST elevation, T wave flattening 2 3, no prior to compare   RADIOLOGY Chest x-ray  on my interpretation without focal consolidation.  CT abdomen pelvis without evidence of free air.  Radiology interpretation showed focal intrahepatic biliary ductal dilatation but no other acute findings.  Chest x-ray without active cardiopulmonary process.   PROCEDURES:  Critical Care performed: No  Procedures   MEDICATIONS ORDERED IN ED: Medications  albuterol  (VENTOLIN  HFA) 108 (90 Base) MCG/ACT inhaler 2 puff (has no administration in time range)  lactated ringers  bolus 1,000 mL (1,000 mLs Intravenous New Bag/Given 02/02/23 0911)  acetaminophen  (TYLENOL ) tablet 1,000 mg (1,000 mg Oral Given 02/02/23 0911)  iohexol  (OMNIPAQUE ) 300 MG/ML solution 100 mL (100 mLs Intravenous Contrast Given 02/02/23 0924)     IMPRESSION / MDM / ASSESSMENT AND PLAN / ED COURSE  I reviewed the triage vital signs and the nursing notes.                              Differential diagnosis includes, but is not limited to, viral illness, influenza, COVID, RSV,  musculoskeletal pain, strain, UTI, considered but doubt pelvic etiology given that pain is not intermittent, she has no vaginal discharge or bleeding.  Considered appendicitis as well as diverticulitis and colitis.  Patient's presentation is most consistent with acute complicated illness / injury requiring diagnostic workup.  Patient with history of psoriatic arthritis presenting with about a week of congestion, cough, shortness of breath, and now having lower abdominal pain.  No right upper quadrant pain or jaundice to suggest acute obstruction of the biliary system.  Her LFTs are mildly elevated but compared to prior are consistent.  Patient also states that her LFTs tend to be elevated and she has no other symptoms associated with biliary issues.  States that Tylenol  helped with her pain.  Had a little bit of wheezing earlier that has resolved, she will get an albuterol  inhaler on discharge.  Shared decision-making with patient and she is agreeable plan for discharge, her symptoms are too far out for Tamiflu.  Suggested symptomatic control with Tylenol  and ibuprofen .  Instructed her to follow-up with primary care doctor for further management of her symptoms as well as to follow-up about the findings found on CT.  Considered but no indication for inpatient mission this time, she is a for outpatient management.  Will discharge.     FINAL CLINICAL IMPRESSION(S) / ED DIAGNOSES   Final diagnoses:  Influenza A  Lower abdominal pain  Muscle ache  Acute cough  Shortness of breath     Rx / DC Orders   ED Discharge Orders     None        Note:  This document was prepared using Dragon voice recognition software and may include unintentional dictation errors.    Waymond Lorelle Cummins, MD 02/02/23 1203    Waymond Lorelle Cummins, MD 02/02/23 [phone]

## 2023-02-02 NOTE — Discharge Instructions (Addendum)
 You were evaluated in the emergency department for shortness of breath, cough, abdominal pain.  Your CT did not show any acute findings to your lower abdomen but did find focal ocal biliary dilatation in your liver.  Since you are not having any right upper quadrant pain, you are not jaundiced and your liver enzymes tend to be mildly elevated, I do not think we need to work this up emergently.  But my recommendation is to follow-up with your primary care doctor for further management of this finding.  I have copied the CT results below.  Please also follow-up with primary care for reassessment for your influenza.  I have also prescribed an albuterol  inhaler that you can use as needed for any s hortness of breath or wheezing.  Please return if you have persistent or worsening symptoms, fever, persistent nausea vomiting, lightheadedness, if you pass out, if you have chest pain or persistent or worsening shortness of breath or if you have any additional concerns.  You can use the inhaler that was given to you in the emergency department.   CLINICAL DATA:  Lower abdominal pain    EXAM:  CT ABDOMEN AND PELVIS WITH CONTRAST    TECHNIQUE:  Multidetector CT imaging of the abdomen and pelvis was performed  using the standard protocol following bolus administration of  intravenous contrast.    RADIATION DOSE REDUCTION: This exam was performed according to the  departmental dose-optimization program which includes automated  exposure control, adjustment of the mA and/or kV according to  patient size and/or use of iterative reconstruction technique.    CONTRAST:  OMNIPAQUE  IOHEXOL  300 MG/ML  SOLN    COMPARISON:  11/22/2022    FINDINGS:  Lower chest: No acute abnormality    Hepatobiliary: Gallbladder unremarkable. Mild focal intrahepatic  biliary ductal dilatation within the right hepatic lobe, new since  prior study. No suspicious focal hepatic abnormality.    Pancreas: No focal abnormality  or ductal dilatation.    Spleen: No focal abnormality.  Normal size.    Adrenals/Urinary Tract: No adrenal abnormality. No focal renal  abnormality. No stones or hydronephrosis. Urinary bladder is  unremarkable.    Stomach/Bowel: Normal appendix. Stomach, large and small bowel  grossly unremarkable.    Vascular/Lymphatic: No evidence of aneurysm or adenopathy.    Reproductive: Uterus and adnexa unremarkable.  No mass.    Other: No free fluid or free air.    Musculoskeletal: No acute bony abnormality.    IMPRESSION:  Focal intrahepatic biliary ductal dilatation within the right  hepatic lobe. Recommend correlation with LFTs.    Otherwise no acute findings.

## 2023-02-02 NOTE — ED Triage Notes (Signed)
 Pt reports lower abd pain that began yesterday, pt states she has had some nausea and constipation. Pt denies dysuria vaginal bleeding or discharge.

## 2023-03-17 ENCOUNTER — Other Ambulatory Visit: Payer: Self-pay

## 2023-03-17 ENCOUNTER — Encounter: Payer: Self-pay | Admitting: Emergency Medicine

## 2023-03-17 ENCOUNTER — Ambulatory Visit
Admission: EM | Admit: 2023-03-17 | Discharge: 2023-03-17 | Disposition: A | Discharge status: Home / Self Care | Payer: BC Managed Care – PPO

## 2023-03-17 DIAGNOSIS — J020 Streptococcal pharyngitis: Secondary | ICD-10-CM

## 2023-03-17 DIAGNOSIS — Z8619 Personal history of other infectious and parasitic diseases: Secondary | ICD-10-CM | POA: Diagnosis not present

## 2023-03-17 LAB — POCT RAPID STREP A (OFFICE): Rapid Strep A Screen: POSITIVE — AB

## 2023-03-17 MED ORDER — FLUCONAZOLE 150 MG PO TABS: 150.0000 mg | ORAL_TABLET | Freq: Once | ORAL | 0 refills | Status: AC

## 2023-03-17 MED ORDER — AMOXICILLIN 500 MG PO CAPS: 500.0000 mg | ORAL_CAPSULE | Freq: Two times a day (BID) | ORAL | 0 refills | Status: AC

## 2023-03-17 NOTE — ED Triage Notes (Signed)
 Friday could not taste, mucous in throat and home covid test on Saturday was positive. Throat pain worsened yesterday.  Denies fever.    Patient is taking ibuprofen and mucinex.

## 2023-03-17 NOTE — Discharge Instructions (Addendum)
Take the amoxicillin and Diflucan as directed.  Follow up with your primary care provider if your symptoms are not improving.    

## 2023-03-17 NOTE — ED Provider Notes (Signed)
 Renaldo Fiddler    CSN: [id number] Arrival date & time: 03/17/23  1447      History   Chief Complaint Chief Complaint  Patient presents with   Sore Throat    HPI Rose Howard is a 28 y.o. female.  Patient presents with sore throat since yesterday.  She reports 5-day history of postnasal drip, runny nose, congestion.  These symptoms are improving.  She tested positive for COVID at home on 03/13/2023.  No fever, rash, shortness of breath.  She has been treating her symptoms with ibuprofen and Mucinex.  The history is provided by the patient and medical records.    Past Medical History:  Diagnosis Date   Abnormal liver enzymes    Anxiety    Depression    Dysmenorrhea    GERD (gastroesophageal reflux disease)    History of COVID-19    Migraine     Patient Active Problem List   Diagnosis Date Noted   Rubella non-immune status, antepartum 09/17/2019   Term pregnancy 09/15/2019   Pregnancy 09/14/2019   Amniotic fluid index increased 07/26/2019   History of liver biopsy 02/05/2019   SI (sacroiliac) joint dysfunction 07/25/2018   Nonallopathic lesion of sacral region 07/25/2018   Nonallopathic lesion of thoracic region 07/25/2018   Nonallopathic lesion of lumbosacral region 07/25/2018   Elevated LFTs 11/03/2016   Bloating 11/03/2016   Abnormal liver enzymes 07/31/2014   Acid reflux 07/31/2014   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   Cardiac murmur 07/31/2014   Avitaminosis D 07/31/2014   HLD (hyperlipidemia) 07/31/2014   Elevated carcinoembryonic antigen 07/31/2014   Anxiety and depression 07/12/2014   Dysmenorrhea 07/12/2014   Contraception management 07/12/2014    Past Surgical History:  Procedure Laterality Date   BREAST SURGERY Bilateral 2014   Breast Reduction   LIVER BIOPSY  08/10/14    OB History     Gravida  2   Para  2   Term  2   Preterm  0   AB  0   Living  2      SAB  0   IAB  0   Ectopic  0   Multiple  0   Live Births  2             Home Medications    Prior to Admission medications   Medication Sig Start Date End Date Taking? Authorizing Provider  amoxicillin (AMOXIL) 500 MG capsule Take 1 capsule (500 mg total) by mouth 2 (two) times daily for 10 days. 03/17/23 03/27/23 Yes Mickie Bail, NP  busPIRone (BUSPAR) 5 MG tablet Take 5 mg by mouth 2 (two) times daily. 12/16/22  Yes [provider]  fluconazole (DIFLUCAN) 150 MG tablet Take 1 tablet (150 mg total) by mouth once for 1 dose. 03/17/23 03/17/23 Yes Mickie Bail, NP  HUMIRA, 2 PEN, 40 MG/0.4ML pen Inject into the skin. 02/11/23  Yes [provider]  dicyclomine (BENTYL) 20 MG tablet Take 1 tablet (20 mg total) by mouth 2 (two) times daily. 11/22/22   Smitty Knudsen, PA-C  ondansetron (ZOFRAN) 4 MG tablet Take 4 mg by mouth every 8 (eight) hours as needed. 02/18/22   [provider]  ondansetron (ZOFRAN-ODT) 4 MG disintegrating tablet Take 1 tablet (4 mg total) by mouth every 8 (eight) hours as needed. 11/22/22   Smitty Knudsen, PA-C  pantoprazole (PROTONIX) 40 MG tablet Take by mouth. 03/31/22 03/31/23  [provider]  promethazine (PHENERGAN) 12.5 MG suppository  Place 1 suppository (12.5 mg total) rectally every 6 (six) hours as needed for nausea or vomiting. 01/20/23   Concha Se, MD  Vilazodone HCl 20 MG TABS Take by mouth. Patient not taking: Reported on 11/26/2022    [provider]  Vitamin D, Ergocalciferol, 50000 units CAPS Take 1 capsule by mouth once a week. 03/31/22   [provider]  vortioxetine HBr (TRINTELLIX) 20 MG TABS tablet Take 20 mg by mouth daily.    [provider]    Family History Family History  Problem Relation Age of Onset   Hypercholesterolemia Mother    Heart disease Father    Alcohol abuse Father    Breast cancer Paternal Grandmother        20s   Colon cancer Paternal Grandmother        52s   Ovarian cancer Neg Hx     Social History Social History    Tobacco Use   Smoking status: Never   Smokeless tobacco: Never  Vaping Use   Vaping status: Never Used  Substance Use Topics   Alcohol use: Not Currently   Drug use: Not Currently    Types: Marijuana     Allergies   Patient has no known allergies.   Review of Systems Review of Systems  Constitutional:  Negative for chills and fever.  HENT:  Positive for congestion, postnasal drip, rhinorrhea and sore throat. Negative for ear pain.   Respiratory:  Negative for cough and shortness of breath.      Physical Exam Triage Vital Signs ED Triage Vitals  Encounter Vitals Group     BP 03/17/23 1511 113/79     Systolic BP Percentile --      Diastolic BP Percentile --      Pulse Rate 03/17/23 1511 84     Resp 03/17/23 1511 18     Temp 03/17/23 1511 97.7 F (36.5 C)     Temp src --      SpO2 03/17/23 1511 97 %     Weight --      Height --      Head Circumference --      Peak Flow --      Pain Score 03/17/23 1519 1     Pain Loc --      Pain Education --      Exclude from Growth Chart --    No data found.  Updated Vital Signs BP 113/79   Pulse 84   Temp 97.7 F (36.5 C)   Resp 18   LMP 02/17/2023   SpO2 97%   Visual Acuity Right Eye Distance:   Left Eye Distance:   Bilateral Distance:    Right Eye Near:   Left Eye Near:    Bilateral Near:     Physical Exam Constitutional:      General: She is not in acute distress. HENT:     Right Ear: Tympanic membrane normal.     Left Ear: Tympanic membrane normal.     Nose: Rhinorrhea present.     Mouth/Throat:     Mouth: Mucous membranes are moist.     Pharynx: Posterior oropharyngeal erythema present.  Cardiovascular:     Rate and Rhythm: Normal rate and regular rhythm.     Heart sounds: Normal heart sounds.  Pulmonary:     Effort: Pulmonary effort is normal. No respiratory distress.     Breath sounds: Normal breath sounds.  Neurological:     Mental Status: She  is alert.      UC Treatments / Results   Labs (all labs ordered are listed, but only abnormal results are displayed) Labs Reviewed  POCT RAPID STREP A (OFFICE) - Abnormal; Notable for the following components:      Result Value   Rapid Strep A Screen Positive (*)    All other components within normal limits    EKG   Radiology No results found.  Procedures Procedures (including critical care time)  Medications Ordered in UC Medications - No data to display  Initial Impression / Assessment and Plan / UC Course  I have reviewed the triage vital signs and the nursing notes.  Pertinent labs & imaging results that were available during my care of the patient were reviewed by me and considered in my medical decision making (see chart for details).    Strep pharyngitis, history of vaginal candidiasis with antibiotic.  Rapid strep positive.  Treating with amoxicillin x 10 days.  1 dose of Diflucan sent to pharmacy per patient request as she reports history of vaginal yeast infection when she takes antibiotic.  Education provided on strep throat.  Tylenol or ibuprofen as needed.  Instructed patient to follow up with her PCP if her symptoms are not improving.  She agrees to plan of care.   Final Clinical Impressions(s) / UC Diagnoses   Final diagnoses:  Strep pharyngitis  History of candidiasis of vagina     Discharge Instructions      Take the amoxicillin and Diflucan as directed.  Follow-up with your primary care provider if your symptoms are not improving.      ED Prescriptions     Medication Sig Dispense Auth. Provider   amoxicillin (AMOXIL) 500 MG capsule Take 1 capsule (500 mg total) by mouth 2 (two) times daily for 10 days. 20 capsule Mickie Bail, NP   fluconazole (DIFLUCAN) 150 MG tablet Take 1 tablet (150 mg total) by mouth once for 1 dose. 1 tablet Mickie Bail, NP      PDMP not reviewed this encounter.   Mickie Bail, NP 03/17/23 [phone]

## 2023-04-21 ENCOUNTER — Other Ambulatory Visit: Payer: Self-pay

## 2023-04-21 ENCOUNTER — Inpatient Hospital Stay
Admission: RE | Admit: 2023-04-21 | Discharge: 2023-04-21 | Disposition: A | Discharge status: Home / Self Care | Payer: Self-pay | Source: Ambulatory Visit | Attending: Neurosurgery | Admitting: Neurosurgery

## 2023-04-21 DIAGNOSIS — Z049 Encounter for examination and observation for unspecified reason: Secondary | ICD-10-CM

## 2023-04-21 NOTE — Addendum Note (Signed)
 Addended by: Barbette Hair on: 04/21/2023 10:03 AM   Modules accepted: Orders

## 2023-04-22 NOTE — Progress Notes (Signed)
 Referring Physician:  Alm Bustard, NP [address] [place],  [place] [address]  Primary Physician:  Alm Bustard, NP  History of Present Illness: 04/23/2023 [name] is here today with a history of psoriatic arthritis who comes today with a chief complaint of back pain.  She states that it is constant and extends into bilateral buttocks and hip.  She states at times it will continue radiating into her legs but does not advance past the middle of her thigh.  She intermittently has numbness and tingling however it is not constant.  She feels as though she can never get comfortable and her legs are feeling weaker.  She denies any falls.  She states that physical therapy has helped some but her pain ranges from a dull ache to a screaming pain.  She has been using Advil, Tylenol, and tramadol to help with her pain.  No saddle anesthesia.   Duration: worsened over the past 4-6 months Severity: 6/10  Precipitating: aggravated by housework, standing/sitting for longer than 10 minutes Modifying factors: made better by resting Weakness: none Timing: constant Bowel/Bladder Dysfunction: none  Conservative measures:  Physical therapy: has participated in at [employer], this was helping at first but now the pain has worsened. She has requested a few more sessions, so not technically been discharged Multimodal medical therapy including regular antiinflammatories: tramadol Injections: no epidural steroid injections  Past Surgery: no spinal surgeries  Rose Howard has no symptoms of cervical myelopathy.  The symptoms are causing a significant impact on the patient's life.   Review of Systems:  A 10 point review of systems is negative, except for the pertinent positives and negatives detailed in the HPI.  Past Medical History: Past Medical History:  Diagnosis Date   Abnormal liver enzymes    Anxiety    Depression    Dysmenorrhea    GERD (gastroesophageal reflux  disease)    History of COVID-19    Migraine     Past Surgical History: Past Surgical History:  Procedure Laterality Date   BREAST SURGERY Bilateral 2014   Breast Reduction   LIVER BIOPSY  08/10/14    Allergies: Allergies as of 04/23/2023 - Review Complete 04/23/2023  Allergen Reaction Noted   Gelatein mct [compleat] Itching 04/23/2023    Medications: Outpatient Encounter Medications as of 04/23/2023  Medication Sig   busPIRone (BUSPAR) 5 MG tablet Take 5 mg by mouth 2 (two) times daily.   dicyclomine (BENTYL) 20 MG tablet Take 1 tablet (20 mg total) by mouth 2 (two) times daily.   HUMIRA, 2 PEN, 40 MG/0.4ML pen Inject into the skin.   ondansetron (ZOFRAN) 4 MG tablet Take 4 mg by mouth every 8 (eight) hours as needed.   ondansetron (ZOFRAN-ODT) 4 MG disintegrating tablet Take 1 tablet (4 mg total) by mouth every 8 (eight) hours as needed.   promethazine (PHENERGAN) 12.5 MG suppository Place 1 suppository (12.5 mg total) rectally every 6 (six) hours as needed for nausea or vomiting.   Vitamin D, Ergocalciferol, 50000 units CAPS Take 1 capsule by mouth once a week.   pantoprazole (PROTONIX) 40 MG tablet Take by mouth.   [DISCONTINUED] Vilazodone HCl 20 MG TABS Take by mouth. (Patient not taking: Reported on 11/26/2022)   [DISCONTINUED] vortioxetine HBr (TRINTELLIX) 20 MG TABS tablet Take 20 mg by mouth daily.   No facility-administered encounter medications on file as of 04/23/2023.    Social History: Social History   Tobacco Use   Smoking  status: Never   Smokeless tobacco: Never  Vaping Use   Vaping status: Never Used  Substance Use Topics   Alcohol use: Not Currently   Drug use: Not Currently    Types: Marijuana    Family Medical History: Family History  Problem Relation Age of Onset   Hypercholesterolemia Mother    Heart disease Father    Alcohol abuse Father    Breast cancer Paternal Grandmother        72s   Colon cancer Paternal Grandmother        49s    Ovarian cancer Neg Hx     Physical Examination: @VITALWITHPAIN @  General: Patient is well developed, well nourished, calm, collected, and in no apparent distress. Attention to examination is appropriate.  Psychiatric: Patient is non-anxious.  Head:  Pupils equal, round, and reactive to light.  ENT:  Oral mucosa appears well hydrated.  Neck:   Supple.  Full range of motion.  Respiratory: Patient is breathing without any difficulty.  Extremities: No edema.  Vascular: Palpable dorsal pedal pulses.  Skin:   On exposed skin, there are no abnormal skin lesions.  NEUROLOGICAL:     Awake, alert, oriented to person, place, and time.  Speech is clear and fluent. Fund of knowledge is appropriate.   Cranial Nerves: Pupils equal round and reactive to light.  Facial tone is symmetric.   ROM of spine: Some mild tenderness palpation of her lumbar spine.  SI joint provocative exams are positive.  Strength:  Side Iliopsoas Quads Hamstring PF DF EHL  R 5 5 5 5 5 5   L 5 5 5 5 5 5    Reflexes are 2+ and symmetric at the patella and achilles.   Clonus is not present.  Toes are down-going.  Bilateral upper and lower extremity sensation is intact to light touch.    Gait is normal.   No difficulty with tandem gait.   No evidence of dysmetria noted.  Medical Decision Making  Imaging: MRI Lumbar spine without contrast 04/14/23:  IMPRESSION:  1. 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  2.  Otherwise, no significant spinal canal or foraminal stenosis in the  lumbar spine.    I have personally reviewed the images and agree with the above interpretation.  Assessment and Plan: [name] is a pleasant 28 y.o. female is here today with a history of psoriatic arthritis who comes today with a chief complaint of back pain.  She states that it is constant and  extends into bilateral buttocks and hip.  She states at times it will continue radiating into her legs but does not advance past the middle of her thigh.  She intermittently has numbness and tingling however it is not constant.  She feels as though she can never get comfortable and her legs are feeling weaker.  She denies any falls.  She states that physical therapy has helped some but her pain ranges from a dull ache to a screaming pain.  She has been using Advil, Tylenol, and tramadol to help with her pain.  No saddle anesthesia.  On examination she has mild tenderness to palpation of her lumbar spine.  Her SI joint provocative exams were positive.  Her most recent MRI of her lumbar spine was reviewed which shows a small disc extrusion on her right side at L5-S1.  It was a pleasure to see  this patient in clinic today.  Plan for updated x-rays today.  In addition, would like patient to continue seeing her physical therapist.  I do think she could benefit from seeing a member of our pain team for potential injections.  I have placed a referral for this.  She was encouraged to talk to her increased pain with her rheumatologist.  Plan to see back in approximately 6 weeks.  We also went over conservative pain management that would be recommended.    Thank you for involving me in the care of this patient.   I spent a total of 45 minutes in both face-to-face and non-face-to-face activities for this visit on the date of this encounter including repairing to the patient, obtaining and reviewing history, performing medically appropriate examination, counseling the patient, ordering and injection, documenting, and care coronation.  Joan Flores, PA-C Dept. of Neurosurgery

## 2023-04-23 ENCOUNTER — Ambulatory Visit
Admission: RE | Admit: 2023-04-23 | Discharge: 2023-04-23 | Disposition: A | Discharge status: Home / Self Care | Source: Ambulatory Visit | Attending: Physician Assistant | Admitting: Physician Assistant

## 2023-04-23 ENCOUNTER — Ambulatory Visit (INDEPENDENT_AMBULATORY_CARE_PROVIDER_SITE_OTHER): Admitting: Physician Assistant

## 2023-04-23 ENCOUNTER — Ambulatory Visit
Admission: RE | Admit: 2023-04-23 | Discharge: 2023-04-23 | Disposition: A | Discharge status: Home / Self Care | Attending: Physician Assistant | Admitting: Physician Assistant

## 2023-04-23 VITALS — BP 118/78 | Ht 62.0 in | Wt 165.0 lb

## 2023-04-23 DIAGNOSIS — M545 Low back pain, unspecified: Secondary | ICD-10-CM

## 2023-04-23 DIAGNOSIS — G8929 Other chronic pain: Secondary | ICD-10-CM | POA: Insufficient documentation

## 2023-04-23 DIAGNOSIS — M48061 Spinal stenosis, lumbar region without neurogenic claudication: Secondary | ICD-10-CM

## 2023-05-01 ENCOUNTER — Other Ambulatory Visit: Payer: Self-pay

## 2023-05-01 ENCOUNTER — Encounter: Payer: Self-pay | Admitting: Emergency Medicine

## 2023-05-01 ENCOUNTER — Ambulatory Visit: Admission: EM | Admit: 2023-05-01 | Discharge: 2023-05-01 | Disposition: A | Discharge status: Home / Self Care

## 2023-05-01 DIAGNOSIS — H938X1 Other specified disorders of right ear: Secondary | ICD-10-CM | POA: Diagnosis not present

## 2023-05-01 MED ORDER — PREDNISONE 10 MG (21) PO TBPK: ORAL_TABLET | Freq: Every day | ORAL | 0 refills | Status: DC

## 2023-05-01 NOTE — Discharge Instructions (Signed)
 Your evaluated for your sinus pressure and ear fullness which I do believe is related to the seasonal weather change and allergies as there is no signs of infection on exam  Begin prednisone every morning as directed to help reduce sinus pressure  Recommend daily allergy medicine until the weather becomes consistently warm  Even though you do not have a runny nose or feel congested I do recommend that you take a decongestant such as pseudoephedrine or mucolytic such as Mucinex to help possible congestion and pressure within the sinus which will help with your ear  You may attempt a over-the-counter nasal spray such as Flonase if you would like in an effort to try to clear out the sinuses  If your symptoms significantly worsen you may follow-up with urgent care for reevaluation

## 2023-05-01 NOTE — ED Provider Notes (Signed)
 Arlander Bellman    CSN: [id number] Arrival date & time: 05/01/23  1138      History   Chief Complaint Chief Complaint  Patient presents with   Ear Pain    HPI Rose Howard is a 28 y.o. female.   Patient presents for evaluation of sinus pressure, sensation of fluid to the right ear and headaches present for 3 days.  No known sick contacts.  Has not attempted treatment.  Denies fever, congestion, cough.  Has seasonal allergies, not currently taking medication.  Tolerating food and liquids.  Past Medical History:  Diagnosis Date   Abnormal liver enzymes    Anxiety    Depression    Dysmenorrhea    GERD (gastroesophageal reflux disease)    History of COVID-19    Migraine     Patient Active Problem List   Diagnosis Date Noted   Rubella non-immune status, antepartum 09/17/2019   Term pregnancy 09/15/2019   Pregnancy 09/14/2019   Amniotic fluid index increased 07/26/2019   History of liver biopsy 02/05/2019   SI (sacroiliac) joint dysfunction 07/25/2018   Nonallopathic lesion of sacral region 07/25/2018   Nonallopathic lesion of thoracic region 07/25/2018   Nonallopathic lesion of lumbosacral region 07/25/2018   Elevated LFTs 11/03/2016   Bloating 11/03/2016   Abnormal liver enzymes 07/31/2014   Acid reflux 07/31/2014   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   Cardiac murmur 07/31/2014   Avitaminosis D 07/31/2014   HLD (hyperlipidemia) 07/31/2014   Elevated carcinoembryonic antigen 07/31/2014   Anxiety and depression 07/12/2014   Dysmenorrhea 07/12/2014   Contraception management 07/12/2014    Past Surgical History:  Procedure Laterality Date   BREAST SURGERY Bilateral 2014   Breast Reduction   LIVER BIOPSY  08/10/14    OB History     Gravida  2   Para  2   Term  2   Preterm  0   AB  0   Living  2      SAB  0   IAB  0   Ectopic  0   Multiple  0   Live Births  2            Home Medications    Prior to Admission medications    Medication Sig Start Date End Date Taking? Authorizing Provider  escitalopram (LEXAPRO) 10 MG tablet Take 10 mg by mouth daily.   Yes [provider]  famotidine (PEPCID) 10 MG tablet Take 10 mg by mouth 2 (two) times daily.   Yes [provider]  predniSONE (STERAPRED UNI-PAK 21 TAB) 10 MG (21) TBPK tablet Take by mouth daily. Take 6 tabs by mouth daily  for 1 days, then 5 tabs for 1 days, then 4 tabs for 1 days, then 3 tabs for 1 days, 2 tabs for 1 days, then 1 tab by mouth daily for 1 days 05/01/23  Yes Usiel Astarita R, NP  busPIRone (BUSPAR) 5 MG tablet Take 5 mg by mouth 2 (two) times daily. 12/16/22   [provider]  dicyclomine (BENTYL) 20 MG tablet Take 1 tablet (20 mg total) by mouth 2 (two) times daily. 11/22/22   Zelaya, Oscar A, PA-C  HUMIRA, 2 PEN, 40 MG/0.4ML pen Inject into the skin. 02/11/23   [provider]  ondansetron (ZOFRAN) 4 MG tablet Take 4 mg by mouth every 8 (eight) hours as needed. 02/18/22   [provider]  ondansetron (ZOFRAN-ODT) 4 MG disintegrating tablet Take 1 tablet (4 mg total) by  mouth every 8 (eight) hours as needed. 11/22/22   Zelaya, Oscar A, PA-C  pantoprazole (PROTONIX) 40 MG tablet Take by mouth. 03/31/22 03/31/23  [provider]  promethazine (PHENERGAN) 12.5 MG suppository Place 1 suppository (12.5 mg total) rectally every 6 (six) hours as needed for nausea or vomiting. 01/20/23   Lubertha Rush, MD  Vitamin D, Ergocalciferol, 50000 units CAPS Take 1 capsule by mouth once a week. 03/31/22   [provider]    Family History Family History  Problem Relation Age of Onset   Hypercholesterolemia Mother    Heart disease Father    Alcohol abuse Father    Breast cancer Paternal Grandmother        1s   Colon cancer Paternal Grandmother        15s   Ovarian cancer Neg Hx     Social History Social History   Tobacco Use   Smoking status: Never   Smokeless tobacco: Never  Vaping Use   Vaping  status: Never Used  Substance Use Topics   Alcohol use: Not Currently   Drug use: Not Currently    Types: Marijuana     Allergies   Gelatein mct [compleat]   Review of Systems Review of Systems   Physical Exam Triage Vital Signs ED Triage Vitals  Encounter Vitals Group     BP 05/01/23 1208 119/81     Systolic BP Percentile --      Diastolic BP Percentile --      Pulse Rate 05/01/23 1208 61     Resp 05/01/23 1208 16     Temp 05/01/23 1208 98.8 F (37.1 C)     Temp Source 05/01/23 1208 Oral     SpO2 --      Weight --      Height --      Head Circumference --      Peak Flow --      Pain Score 05/01/23 1209 2     Pain Loc --      Pain Education --      Exclude from Growth Chart --    No data found.  Updated Vital Signs BP 119/81 (BP Location: Left Arm)   Pulse 61   Temp 98.8 F (37.1 C) (Oral)   Resp 16   LMP 04/15/2023 (Exact Date)   Visual Acuity Right Eye Distance:   Left Eye Distance:   Bilateral Distance:    Right Eye Near:   Left Eye Near:    Bilateral Near:     Physical Exam Constitutional:      Appearance: Normal appearance.  HENT:     Right Ear: Ear canal and external ear normal. A middle ear effusion is present.     Left Ear: Tympanic membrane, ear canal and external ear normal.     Nose: Nose normal.     Right Sinus: No maxillary sinus tenderness or frontal sinus tenderness.     Left Sinus: No maxillary sinus tenderness or frontal sinus tenderness.     Mouth/Throat:     Pharynx: No oropharyngeal exudate or posterior oropharyngeal erythema.  Eyes:     Extraocular Movements: Extraocular movements intact.  Pulmonary:     Effort: Pulmonary effort is normal.  Neurological:     Mental Status: She is alert and oriented to person, place, and time. Mental status is at baseline.      UC Treatments / Results  Labs (all labs ordered are listed, but only abnormal results are  displayed) Labs Reviewed - No data to  display  EKG   Radiology No results found.  Procedures Procedures (including critical care time)  Medications Ordered in UC Medications - No data to display  Initial Impression / Assessment and Plan / UC Course  I have reviewed the triage vital signs and the nursing notes.  Pertinent labs & imaging results that were available during my care of the patient were reviewed by me and considered in my medical decision making (see chart for details).  Right ear fullness  Vitals are stable, patient in no signs of distress nontoxic-appearing, no signs of infection to the ear, discussed,middle ear effusion present, based on symptomology most likely related to seasonal weather change, discussed recommended initiating antihistamine and prescribed prednisone for general comfort, recommended additional supportive care and advised follow-up if symptoms worsen Final Clinical Impressions(s) / UC Diagnoses   Final diagnoses:  Ear fullness, right     Discharge Instructions      Your evaluated for your sinus pressure and ear fullness which I do believe is related to the seasonal weather change and allergies as there is no signs of infection on exam  Begin prednisone every morning as directed to help reduce sinus pressure  Recommend daily allergy medicine until the weather becomes consistently warm  Even though you do not have a runny nose or feel congested I do recommend that you take a decongestant such as pseudoephedrine or mucolytic such as Mucinex to help possible congestion and pressure within the sinus which will help with your ear  You may attempt a over-the-counter nasal spray such as Flonase if you would like in an effort to try to clear out the sinuses  If your symptoms significantly worsen you may follow-up with urgent care for reevaluation   ED Prescriptions     Medication Sig Dispense Auth. Provider   predniSONE (STERAPRED UNI-PAK 21 TAB) 10 MG (21) TBPK tablet Take by mouth  daily. Take 6 tabs by mouth daily  for 1 days, then 5 tabs for 1 days, then 4 tabs for 1 days, then 3 tabs for 1 days, 2 tabs for 1 days, then 1 tab by mouth daily for 1 days 21 tablet Jesseca Marsch, Maybelle Spatz, NP      PDMP not reviewed this encounter.   Reena Canning, NP 05/01/23 1228

## 2023-05-01 NOTE — ED Triage Notes (Signed)
 Patient presents to [facility] because she believes she has an ear infection.  States she has been waking up with headache for 3 days and has had facial pain and pressure on the right side.  Feels like liquid is moving around in her ear when she moves her head.  2/10 ear pain

## 2023-06-01 ENCOUNTER — Encounter: Payer: Self-pay | Admitting: Student in an Organized Health Care Education/Training Program

## 2023-06-01 ENCOUNTER — Ambulatory Visit
Attending: Student in an Organized Health Care Education/Training Program | Admitting: Student in an Organized Health Care Education/Training Program

## 2023-06-01 VITALS — BP 118/78 | HR 83 | Temp 97.2°F | Resp 16 | Ht 62.0 in | Wt 170.0 lb

## 2023-06-01 DIAGNOSIS — M461 Sacroiliitis, not elsewhere classified: Secondary | ICD-10-CM | POA: Diagnosis present

## 2023-06-01 DIAGNOSIS — M533 Sacrococcygeal disorders, not elsewhere classified: Secondary | ICD-10-CM | POA: Diagnosis not present

## 2023-06-01 DIAGNOSIS — G5702 Lesion of sciatic nerve, left lower limb: Secondary | ICD-10-CM | POA: Insufficient documentation

## 2023-06-01 DIAGNOSIS — G5701 Lesion of sciatic nerve, right lower limb: Secondary | ICD-10-CM | POA: Diagnosis not present
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
## 2023-06-01 NOTE — Progress Notes (Signed)
 Safety precautions to be maintained throughout the outpatient stay will include: orient to surroundings, keep bed in low position, maintain call bell within reach at all times, provide assistance with transfer out of bed and ambulation.

## 2023-06-01 NOTE — Patient Instructions (Signed)
 GENERAL RISKS AND COMPLICATIONS  What are the risk, side effects and possible complications? Generally speaking, most procedures are safe.  However, with any procedure there are risks, side effects, and the possibility of complications.  The risks and complications are dependent upon the sites that are lesioned, or the type of nerve block to be performed.  The closer the procedure is to the spine, the more serious the risks are.  Great care is taken when placing the radio frequency needles, block needles or lesioning probes, but sometimes complications can occur. Infection: Any time there is an injection through the skin, there is a risk of infection.  This is why sterile conditions are used for these blocks.  There are four possible types of infection. Localized skin infection. Central Nervous System Infection-This can be in the form of Meningitis, which can be deadly. Epidural Infections-This can be in the form of an epidural abscess, which can cause pressure inside of the spine, causing compression of the spinal cord with subsequent paralysis. This would require an emergency surgery to decompress, and there are no guarantees that the patient would recover from the paralysis. Discitis-This is an infection of the intervertebral discs.  It occurs in about 1% of discography procedures.  It is difficult to treat and it may lead to surgery.        2. Pain: the needles have to go through skin and soft tissues, will cause soreness.       3. Damage to internal structures:  The nerves to be lesioned may be near blood vessels or    other nerves which can be potentially damaged.       4. Bleeding: Bleeding is more common if the patient is taking blood thinners such as  aspirin, Coumadin, Ticiid, Plavix, etc., or if he/she have some genetic predisposition  such as hemophilia. Bleeding into the spinal canal can cause compression of the spinal  cord with subsequent paralysis.  This would require an emergency  surgery to  decompress and there are no guarantees that the patient would recover from the  paralysis.       5. Pneumothorax:  Puncturing of a lung is a possibility, every time a needle is introduced in  the area of the chest or upper back.  Pneumothorax refers to free air around the  collapsed lung(s), inside of the thoracic cavity (chest cavity).  Another two possible  complications related to a similar event would include: Hemothorax and Chylothorax.   These are variations of the Pneumothorax, where instead of air around the collapsed  lung(s), you may have blood or chyle, respectively.       6. Spinal headaches: They may occur with any procedures in the area of the spine.       7. Persistent CSF (Cerebro-Spinal Fluid) leakage: This is a rare problem, but may occur  with prolonged intrathecal or epidural catheters either due to the formation of a fistulous  track or a dural tear.       8. Nerve damage: By working so close to the spinal cord, there is always a possibility of  nerve damage, which could be as serious as a permanent spinal cord injury with  paralysis.       9. Death:  Although rare, severe deadly allergic reactions known as "Anaphylactic  reaction" can occur to any of the medications used.      10. Worsening of the symptoms:  We can always make thing worse.  What are the chances  of something like this happening? Chances of any of this occuring are extremely low.  By statistics, you have more of a chance of getting killed in a motor vehicle accident: while driving to the hospital than any of the above occurring .  Nevertheless, you should be aware that they are possibilities.  In general, it is similar to taking a shower.  Everybody knows that you can slip, hit your head and get killed.  Does that mean that you should not shower again?  Nevertheless always keep in mind that statistics do not mean anything if you happen to be on the wrong side of them.  Even if a procedure has a 1 (one) in a  1,000,000 (million) chance of going wrong, it you happen to be that one..Also, keep in mind that by statistics, you have more of a chance of having something go wrong when taking medications.  Who should not have this procedure? If you are on a blood thinning medication (e.g. Coumadin, Plavix, see list of "Blood Thinners"), or if you have an active infection going on, you should not have the procedure.  If you are taking any blood thinners, please inform your physician.  How should I prepare for this procedure? Do not eat or drink anything at least six hours prior to the procedure. Bring a driver with you .  It cannot be a taxi. Come accompanied by an adult that can drive you back, and that is strong enough to help you if your legs get weak or numb from the local anesthetic. Take all of your medicines the morning of the procedure with just enough water to swallow them. If you have diabetes, make sure that you are scheduled to have your procedure done first thing in the morning, whenever possible. If you have diabetes, take only half of your insulin dose and notify our nurse that you have done so as soon as you arrive at the clinic. If you are diabetic, but only take blood sugar pills (oral hypoglycemic), then do not take them on the morning of your procedure.  You may take them after you have had the procedure. Do not take aspirin or any aspirin-containing medications, at least eleven (11) days prior to the procedure.  They may prolong bleeding. Wear loose fitting clothing that may be easy to take off and that you would not mind if it got stained with Betadine or blood. Do not wear any jewelry or perfume Remove any nail coloring.  It will interfere with some of our monitoring equipment.  NOTE: Remember that this is not meant to be interpreted as a complete list of all possible complications.  Unforeseen problems may occur.  BLOOD THINNERS The following drugs contain aspirin or other products,  which can cause increased bleeding during surgery and should not be taken for 2 weeks prior to and 1 week after surgery.  If you should need take something for relief of minor pain, you may take acetaminophen which is found in Tylenol,m Datril, Anacin-3 and Panadol. It is not blood thinner. The products listed below are.  Do not take any of the products listed below in addition to any listed on your instruction sheet.  A.P.C or A.P.C with Codeine Codeine Phosphate Capsules #3 Ibuprofen Ridaura  ABC compound Congesprin Imuran rimadil  Advil Cope Indocin Robaxisal  Alka-Seltzer Effervescent Pain Reliever and Antacid Coricidin or Coricidin-D  Indomethacin Rufen  Alka-Seltzer plus Cold Medicine Cosprin Ketoprofen S-A-C Tablets  Anacin Analgesic Tablets or Capsules Coumadin  Korlgesic Salflex  Anacin Extra Strength Analgesic tablets or capsules CP-2 Tablets Lanoril Salicylate  Anaprox Cuprimine Capsules Levenox Salocol  Anexsia-D Dalteparin Magan Salsalate  Anodynos Darvon compound Magnesium Salicylate Sine-off  Ansaid Dasin Capsules Magsal Sodium Salicylate  Anturane Depen Capsules Marnal Soma  APF Arthritis pain formula Dewitt's Pills Measurin Stanback  Argesic Dia-Gesic Meclofenamic Sulfinpyrazone  Arthritis Bayer Timed Release Aspirin Diclofenac Meclomen Sulindac  Arthritis pain formula Anacin Dicumarol Medipren Supac  Analgesic (Safety coated) Arthralgen Diffunasal Mefanamic Suprofen  Arthritis Strength Bufferin Dihydrocodeine Mepro Compound Suprol  Arthropan liquid Dopirydamole Methcarbomol with Aspirin Synalgos  ASA tablets/Enseals Disalcid Micrainin Tagament  Ascriptin Doan's Midol Talwin  Ascriptin A/D Dolene Mobidin Tanderil  Ascriptin Extra Strength Dolobid Moblgesic Ticlid  Ascriptin with Codeine Doloprin or Doloprin with Codeine Momentum Tolectin  Asperbuf Duoprin Mono-gesic Trendar  Aspergum Duradyne Motrin or Motrin IB Triminicin  Aspirin plain, buffered or enteric coated  Durasal Myochrisine Trigesic  Aspirin Suppositories Easprin Nalfon Trillsate  Aspirin with Codeine Ecotrin Regular or Extra Strength Naprosyn Uracel  Atromid-S Efficin Naproxen Ursinus  Auranofin Capsules Elmiron Neocylate Vanquish  Axotal Emagrin Norgesic Verin  Azathioprine Empirin or Empirin with Codeine Normiflo Vitamin E  Azolid Emprazil Nuprin Voltaren  Bayer Aspirin plain, buffered or children's or timed BC Tablets or powders Encaprin Orgaran Warfarin Sodium  Buff-a-Comp Enoxaparin Orudis Zorpin  Buff-a-Comp with Codeine Equegesic Os-Cal-Gesic   Buffaprin Excedrin plain, buffered or Extra Strength Oxalid   Bufferin Arthritis Strength Feldene Oxphenbutazone   Bufferin plain or Extra Strength Feldene Capsules Oxycodone with Aspirin   Bufferin with Codeine Fenoprofen Fenoprofen Pabalate or Pabalate-SF   Buffets II Flogesic Panagesic   Buffinol plain or Extra Strength Florinal or Florinal with Codeine Panwarfarin   Buf-Tabs Flurbiprofen Penicillamine   Butalbital Compound Four-way cold tablets Penicillin   Butazolidin Fragmin Pepto-Bismol   Carbenicillin Geminisyn Percodan   Carna Arthritis Reliever Geopen Persantine   Carprofen Gold's salt Persistin   Chloramphenicol Goody's Phenylbutazone   Chloromycetin Haltrain Piroxlcam   Clmetidine heparin Plaquenil   Cllnoril Hyco-pap Ponstel   Clofibrate Hydroxy chloroquine Propoxyphen         Before stopping any of these medications, be sure to consult the physician who ordered them.  Some, such as Coumadin (Warfarin) are ordered to prevent or treat serious conditions such as "deep thrombosis", "pumonary embolisms", and other heart problems.  The amount of time that you may need off of the medication may also vary with the medication and the reason for which you were taking it.  If you are taking any of these medications, please make sure you notify your pain physician before you undergo any procedures.         Moderate Conscious  Sedation, Adult Sedation is the use of medicines to help you relax and not feel pain. Moderate conscious sedation is a type of sedation that makes you less alert than normal. You are still able to respond to instructions, touch, or both. This type of sedation is used during short medical and dental procedures. It is milder than deep sedation, which is a type of sedation you cannot be easily woken up from. It is also milder than general anesthesia, which is the use of medicines to make you fall asleep. Moderate conscious sedation lets you return to your normal activities sooner. Tell a health care provider about: Any allergies you have. All medicines you are taking, including vitamins, herbs, steroids, eye drops, creams, and over-the-counter medicines. Any problems you or family members have had with anesthesia.  Any bleeding problems you have. Any surgeries you have had. Any medical conditions you have. Whether you are pregnant or may be pregnant. Any recent alcohol, tobacco, or drug use. What are the risks? Your health care provider will talk with you about risks. These may include: Oversedation. This is when you get too much medicine. Nausea or vomiting. Allergic reaction to medicines. Trouble breathing. If this happens, a breathing tube may be used. It will be removed when you can breathe better on your own. Heart trouble. Lung trouble. Emergence delirium. This is when you feel confused while the sedation wears off. This gets better with time. What happens before the procedure? When to stop eating and drinking Follow instructions from your health care provider about what you may eat and drink. These may include: 8 hours before your procedure Stop eating most foods. Do not eat meat, fried foods, or fatty foods. Eat only light foods, such as toast or crackers. All liquids are okay except energy drinks and alcohol. 6 hours before your procedure Stop eating. Drink only clear liquids, such  as water, clear fruit juice, black coffee, plain tea, and sports drinks. Do not drink energy drinks or alcohol. 2 hours before your procedure Stop drinking all liquids. You may be allowed to take medicines with small sips of water. If you do not follow your health care provider's instructions, your procedure may be delayed or canceled. Medicines Ask your health care provider about: Changing or stopping your regular medicines. These include any diabetes medicines or blood thinners you take. Taking medicines such as aspirin and ibuprofen. These medicines can thin your blood. Do not take them unless your health care provider tells you to. Taking over-the-counter medicines, vitamins, herbs, and supplements. Tests and exams You may have an exam or testing. You may have a blood or urine sample taken. General instructions Do not use any products that contain nicotine or tobacco for at least 4 weeks before the procedure. These products include cigarettes, chewing tobacco, and vaping devices, such as e-cigarettes. If you need help quitting, ask your health care provider. If you will be going home right after the procedure, plan to have a responsible adult: Take you home from the hospital or clinic. You will not be allowed to drive. Care for you for the time you are told. What happens during the procedure?  You will be given the sedative. It may be given: As a pill you can take by mouth. It can also be put into the rectum. As a spray through the nose. As an injection into muscle. As an injection into a vein through an IV. You may be given oxygen as needed. Your blood pressure, heart rate, breathing rate, and blood oxygen level will be monitored during the procedure. The medical or dental procedure will be done. The procedure may vary among health care providers and hospitals. What happens after the procedure? Your blood pressure, heart rate, breathing rate, and blood oxygen level will be  monitored until you leave the hospital or clinic. You will get fluids through an IV as needed. Do not drive or operate machinery until your health care provider says that it is safe. This information is not intended to replace advice given to you by your health care provider. Make sure you discuss any questions you have with your health care provider. Document Revised: 07/21/2021 Document Reviewed: 07/21/2021 Elsevier Patient Education  2024 [employer] Sacroiliac (SI) Joint Injection Patient Information  Description: The sacroiliac joint connects the scrum (  very low back and tailbone) to the ilium (a pelvic bone which also forms half of the hip joint).  Normally this joint experiences very little motion.  When this joint becomes inflamed or unstable low back and or hip and pelvis pain may result.  Injection of this joint with local anesthetics (numbing medicines) and steroids can provide diagnostic information and reduce pain.  This injection is performed with the aid of x-ray guidance into the tailbone area while you are lying on your stomach.   You may experience an electrical sensation down the leg while this is being done.  You may also experience numbness.  We also may ask if we are reproducing your normal pain during the injection.  Conditions which may be treated SI injection:  Low back, buttock, hip or leg pain  Preparation for the Injection:  Do not eat any solid food or dairy products within 8 hours of your appointment.  You may drink clear liquids up to 3 hours before appointment.  Clear liquids include water, black coffee, juice or soda.  No milk or cream please. You may take your regular medications, including pain medications with a sip of water before your appointment.  Diabetics should hold regular insulin (if take separately) and take 1/2 normal NPH dose the morning of the procedure.  Carry some sugar containing items with you to your appointment. A driver must accompany you  and be prepared to drive you home after your procedure. Bring all of your current medications with you. An IV may be inserted and sedation may be given at the discretion of the physician. A blood pressure cuff, EKG and other monitors will often be applied during the procedure.  Some patients may need to have extra oxygen administered for a short period.  You will be asked to provide medical information, including your allergies, prior to the procedure.  We must know immediately if you are taking blood thinners (like Coumadin/Warfarin) or if you are allergic to IV iodine contrast (dye).  We must know if you could possible be pregnant.  Possible side effects:  Bleeding from needle site Infection (rare, may require surgery) Nerve injury (rare) Numbness & tingling (temporary) A brief convulsion or seizure Light-headedness (temporary) Pain at injection site (several days) Decreased blood pressure (temporary) Weakness in the leg (temporary)   Call if you experience:  New onset weakness or numbness of an extremity below the injection site that last more than 8 hours. Hives or difficulty breathing ( go to the emergency room) Inflammation or drainage at the injection site Any new symptoms which are concerning to you  Please note:  Although the local anesthetic injected can often make your back/ hip/ buttock/ leg feel good for several hours after the injections, the pain will likely return.  It takes 3-7 days for steroids to work in the sacroiliac area.  You may not notice any pain relief for at least that one week.  If effective, we will often do a series of three injections spaced 3-6 weeks apart to maximally decrease your pain.  After the initial series, we generally will wait some months before a repeat injection of the same type.  If you have any questions, please call [phone] [facility]

## 2023-06-18 ENCOUNTER — Ambulatory Visit: Admitting: Physician Assistant

## 2023-06-21 ENCOUNTER — Ambulatory Visit
Admission: RE | Admit: 2023-06-21 | Discharge: 2023-06-21 | Disposition: A | Discharge status: Home / Self Care | Source: Ambulatory Visit | Attending: Student in an Organized Health Care Education/Training Program | Admitting: Student in an Organized Health Care Education/Training Program

## 2023-06-21 ENCOUNTER — Encounter: Payer: Self-pay | Admitting: Student in an Organized Health Care Education/Training Program

## 2023-06-21 ENCOUNTER — Ambulatory Visit
Attending: Student in an Organized Health Care Education/Training Program | Admitting: Student in an Organized Health Care Education/Training Program

## 2023-06-21 DIAGNOSIS — M47818 Spondylosis without myelopathy or radiculopathy, sacral and sacrococcygeal region: Secondary | ICD-10-CM | POA: Insufficient documentation

## 2023-06-21 DIAGNOSIS — M461 Sacroiliitis, not elsewhere classified: Secondary | ICD-10-CM | POA: Diagnosis not present

## 2023-06-21 DIAGNOSIS — G5701 Lesion of sciatic nerve, right lower limb: Secondary | ICD-10-CM | POA: Insufficient documentation

## 2023-06-21 DIAGNOSIS — M533 Sacrococcygeal disorders, not elsewhere classified: Secondary | ICD-10-CM | POA: Insufficient documentation

## 2023-06-21 DIAGNOSIS — M545 Low back pain, unspecified: Secondary | ICD-10-CM | POA: Insufficient documentation

## 2023-06-21 DIAGNOSIS — G5702 Lesion of sciatic nerve, left lower limb: Secondary | ICD-10-CM | POA: Insufficient documentation

## 2023-06-21 MED ORDER — METHYLPREDNISOLONE ACETATE 80 MG/ML IJ SUSP
INTRAMUSCULAR | Status: AC
  Filled 2023-06-21: qty 1

## 2023-06-21 MED ORDER — DEXAMETHASONE SODIUM PHOSPHATE 10 MG/ML IJ SOLN
10.0000 mg | Freq: Once | INTRAMUSCULAR | Status: AC
  Administered 2023-06-21: 10 mg

## 2023-06-21 MED ORDER — METHYLPREDNISOLONE ACETATE 80 MG/ML IJ SUSP: 80.0000 mg | Freq: Once | INTRAMUSCULAR | Status: DC

## 2023-06-21 MED ORDER — LIDOCAINE HCL 2 % IJ SOLN
20.0000 mL | Freq: Once | INTRAMUSCULAR | Status: AC
  Administered 2023-06-21: 400 mg

## 2023-06-21 MED ORDER — IOHEXOL 180 MG/ML  SOLN
10.0000 mL | Freq: Once | INTRAMUSCULAR | Status: AC
  Administered 2023-06-21: 10 mL via INTRATHECAL

## 2023-06-21 MED ORDER — ROPIVACAINE HCL 2 MG/ML IJ SOLN
9.0000 mL | Freq: Once | INTRAMUSCULAR | Status: AC
  Administered 2023-06-21: 9 mL via PERINEURAL

## 2023-06-21 MED ORDER — DIAZEPAM 5 MG PO TABS
10.0000 mg | ORAL_TABLET | ORAL | Status: AC
  Administered 2023-06-21: 10 mg via ORAL

## 2023-06-21 MED ORDER — DEXAMETHASONE SODIUM PHOSPHATE 10 MG/ML IJ SOLN
INTRAMUSCULAR | Status: AC
  Filled 2023-06-21: qty 2

## 2023-06-21 MED ORDER — IOHEXOL 180 MG/ML  SOLN
INTRAMUSCULAR | Status: AC
  Filled 2023-06-21: qty 20

## 2023-06-21 MED ORDER — ROPIVACAINE HCL 2 MG/ML IJ SOLN
9.0000 mL | Freq: Once | INTRAMUSCULAR | Status: AC
  Administered 2023-06-21: 9 mL via INTRA_ARTICULAR

## 2023-06-21 MED ORDER — DIAZEPAM 5 MG PO TABS
ORAL_TABLET | ORAL | Status: AC
Start: 2023-06-21 — End: ?
  Filled 2023-06-21: qty 2

## 2023-06-21 MED ORDER — ROPIVACAINE HCL 2 MG/ML IJ SOLN
INTRAMUSCULAR | Status: AC
  Filled 2023-06-21: qty 20

## 2023-06-21 MED ORDER — LIDOCAINE HCL 2 % IJ SOLN
INTRAMUSCULAR | Status: AC
  Filled 2023-06-21: qty 20

## 2023-06-21 MED ORDER — DIAZEPAM 5 MG PO TABS
ORAL_TABLET | ORAL | Status: AC
  Filled 2023-06-21: qty 1

## 2023-06-21 NOTE — Progress Notes (Signed)
 Safety precautions to be maintained throughout the outpatient stay will include: orient to surroundings, keep bed in low position, maintain call bell within reach at all times, provide assistance with transfer out of bed and ambulation.

## 2023-06-21 NOTE — Progress Notes (Signed)
 PROVIDER NOTE: Interpretation of information contained herein should be left to medically-trained personnel. Specific patient instructions are provided elsewhere under "Patient Instructions" section of medical record. This document was created in part using STT-dictation technology, any transcriptional errors that may result from this process are unintentional.  Patient: Rose Howard Type: Established DOB: [birth year] MRN: [id number] PCP: Will Hare, NP  Service: Procedure DOS: 06/21/2023 Setting: Ambulatory Location: Ambulatory outpatient facility Delivery: Face-to-face Provider: Cephus Collin, MD Specialty: Interventional Pain Management Specialty designation: 09 Location: Outpatient facility Ref. Prov.: Cephus Collin, MD       Interventional Therapy   Procedure: Sacroiliac Joint Steroid Injection #1    Laterality: Bilateral     Level: PIIS (Posterior Inferior Iliac Spine)  Target: Interarticular sacroiliac joint. Location: Medial to the postero-medial edge of iliac spine. Region: Lumbosacral-sacrococcygeal. Approach: Inferior postero-medial percutaneous approach. Type of procedure: Percutaneous joint injection.  Imaging: Fluoroscopy-guided Non-spinal ([id number]) Anesthesia: Local anesthesia (1-2% Lidocaine ) Sedation: Minimal Sedation                       DOS: 06/21/2023  Performed by: Cephus Collin, MD  Purpose: Diagnostic/Therapeutic Indications: Sacroiliac joint pain in the lower back and hip area severe enough to impact quality of life or function. Rationale (medical necessity): procedure needed and proper for the diagnosis and/or treatment of Rose Howard's medical symptoms and needs. 1. Sacroiliac joint pain   2. SI joint arthritis (HCC)   3. Piriformis syndrome of left side   4. Piriformis syndrome of right side    NAS-11 Pain score:   Pre-procedure: 4 /10   Post-procedure: 4 /10      Position / Prep / Materials:  Position: Prone  Prep solution: ChloraPrep  (2% chlorhexidine gluconate and 70% isopropyl alcohol) Prep Area: Entire posterior lumbosacral area  Materials:  Tray: Block Needle(s):  Type: Spinal  Gauge (G): 22  Length: 3.5-in Qty: One (1) per procedure side.  H&P (Pre-op Assessment):  Rose Howard is a 28 y.o. (year old), female patient, seen today for interventional treatment. She  has a past surgical history that includes Breast surgery (Bilateral, 2014) and Liver biopsy (08/10/14). Rose Howard has a current medication list which includes the following prescription(s): buspirone, escitalopram, famotidine, infliximab, magnesium, methylphenidate, ondansetron , ondansetron , pantoprazole , ergocalciferol , dicyclomine , humira (2 pen), methylphenidate, prednisone , and promethazine . Her primarily concern today is the Back Pain  Initial Vital Signs:  Pulse/HCG Rate: 90ECG Heart Rate: 79 Temp: 97.7 F (36.5 C) Resp: 16 BP: 123/84 SpO2: 100 %  BMI: Estimated body mass index is 31.09 kg/m as calculated from the following:   Height as of this encounter: 5\' 2"  (1.575 m).   Weight as of this encounter: 170 lb (77.1 kg).  Risk Assessment: Allergies: Reviewed. She is allergic to gelatein mct [compleat].  Allergy Precautions: None required Coagulopathies: Reviewed. None identified.  Blood-thinner therapy: None at this time Active Infection(s): Reviewed. None identified. Rose Howard is afebrile  Site Confirmation: Rose Howard was asked to confirm the procedure and laterality before marking the site Procedure checklist: Completed Consent: Before the procedure and under the influence of no sedative(s), amnesic(s), or anxiolytics, the patient was informed of the treatment options, risks and possible complications. To fulfill our ethical and legal obligations, as recommended by the American Medical Association's Code of Ethics, I have informed the patient of my clinical impression; the nature and purpose of the treatment or procedure; the risks,  benefits, and possible complications of the intervention; the alternatives, including doing nothing;  the risk(s) and benefit(s) of the alternative treatment(s) or procedure(s); and the risk(s) and benefit(s) of doing nothing. The patient was provided information about the general risks and possible complications associated with the procedure. These may include, but are not limited to: failure to achieve desired goals, infection, bleeding, organ or nerve damage, allergic reactions, paralysis, and death. In addition, the patient was informed of those risks and complications associated to the procedure, such as failure to decrease pain; infection; bleeding; organ or nerve damage with subsequent damage to sensory, motor, and/or autonomic systems, resulting in permanent pain, numbness, and/or weakness of one or several areas of the body; allergic reactions; (i.e.: anaphylactic reaction); and/or death. Furthermore, the patient was informed of those risks and complications associated with the medications. These include, but are not limited to: allergic reactions (i.e.: anaphylactic or anaphylactoid reaction(s)); adrenal axis suppression; blood sugar elevation that in diabetics may result in ketoacidosis or comma; water retention that in patients with history of congestive heart failure may result in shortness of breath, pulmonary edema, and decompensation with resultant heart failure; weight gain; swelling or edema; medication-induced neural toxicity; particulate matter embolism and blood vessel occlusion with resultant organ, and/or nervous system infarction; and/or aseptic necrosis of one or more joints. Finally, the patient was informed that Medicine is not an exact science; therefore, there is also the possibility of unforeseen or unpredictable risks and/or possible complications that may result in a catastrophic outcome. The patient indicated having understood very clearly. We have given the patient no guarantees  and we have made no promises. Enough time was given to the patient to ask questions, all of which were answered to the patient's satisfaction. Rose Howard has indicated that she wanted to continue with the procedure. Attestation: I, the ordering provider, attest that I have discussed with the patient the benefits, risks, side-effects, alternatives, likelihood of achieving goals, and potential problems during recovery for the procedure that I have provided informed consent. Date  Time: 06/21/2023  1:29 PM  Pre-Procedure Preparation:  Monitoring: As per clinic protocol. Respiration, ETCO2, SpO2, BP, heart rate and rhythm monitor placed and checked for adequate function Safety Precautions: Patient was assessed for positional comfort and pressure points before starting the procedure. Time-out: I initiated and conducted the "Time-out" before starting the procedure, as per protocol. The patient was asked to participate by confirming the accuracy of the "Time Out" information. Verification of the correct person, site, and procedure were performed and confirmed by me, the nursing staff, and the patient. "Time-out" conducted as per Joint Commission's Universal Protocol (UP.01.01.01). Time: 1407 Start Time: 1407 hrs.  Description/Narrative of Procedure:          Start Time: 1407 hrs.  Rationale (medical necessity): procedure needed and proper for the diagnosis and/or treatment of the patient's medical symptoms and needs. Procedural Technique Safety Precautions: Aspiration looking for blood return was conducted prior to all injections. At no point did we inject any substances, as a needle was being advanced. No attempts were made at seeking any paresthesias. Safe injection practices and needle disposal techniques used. Medications properly checked for expiration dates. SDV (single dose vial) medications used. Description of the Procedure: Protocol guidelines were followed. The patient was assisted into a  comfortable position. The target area was identified and the area prepped in the usual manner. Skin & deeper tissues infiltrated with local anesthetic. Appropriate amount of time allowed to pass for local anesthetics to take effect. The procedure needles were then advanced to the target area.  Proper needle placement secured. Negative aspiration confirmed. Solution injected in intermittent fashion, asking for systemic symptoms every 0.5cc of injectate. The needles were then removed and the area cleansed, making sure to leave some of the prepping solution back to take advantage of its long term bactericidal properties.  Technical description of procedure:  Fluoroscopy using a posterior anterior 45 degree angle from the midline aiming at the anterolateral aspect of the patient was used to find a direct path into the sacroiliac joint, the superior medial to posterior superior iliac spine.  The skin was marked where the desired target and the skin infiltrated with local anesthetics.  The procedure needle was then advanced until the joint was entered.  Once inside of the joint, we then proceeded to inject the desired solution.  10 cc solution made of 8 cc of 0.2% ropivacaine , 2 cc of Decadron 10 mg/cc.  5 cc injected for the left SI joint, 5 cc injected for the right SI joint.   Vitals:   06/21/23 1337 06/21/23 1406 06/21/23 1411  BP: 123/84 (!) 132/97 (!) 121/94  Pulse: 90    Resp: 16 18 17   Temp: 97.7 F (36.5 C)    SpO2: 100% 100% 100%  Weight: 170 lb (77.1 kg)    Height: 5\' 2"  (1.575 m)       End Time: 1411 hrs.  Imaging Guidance (Non-Spinal):          Type of Imaging Technique: Fluoroscopy Guidance (Non-Spinal) Indication(s): Fluoroscopy guidance for needle placement to enhance accuracy in procedures requiring precise needle localization for targeted delivery of medication in or near specific anatomical locations not easily accessible without such real-time imaging assistance. Exposure Time:  Please see nurses notes. Contrast: Before injecting any contrast, we confirmed that the patient did not have an allergy to iodine, shellfish, or radiological contrast. Once satisfactory needle placement was completed at the desired level, radiological contrast was injected. Contrast injected under live fluoroscopy. No contrast complications. See chart for type and volume of contrast used. Fluoroscopic Guidance: I was personally present during the use of fluoroscopy. "Tunnel Vision Technique" used to obtain the best possible view of the target area. Parallax error corrected before commencing the procedure. "Direction-depth-direction" technique used to introduce the needle under continuous pulsed fluoroscopy. Once target was reached, antero-posterior, oblique, and lateral fluoroscopic projection used confirm needle placement in all planes. Images permanently stored in EMR. Interpretation: I personally interpreted the imaging intraoperatively. Adequate needle placement confirmed in multiple planes. Appropriate spread of contrast into desired area was observed. No evidence of afferent or efferent intravascular uptake. Permanent images saved into the patient's record.  Post-operative Assessment:  Post-procedure Vital Signs:  Pulse/HCG Rate: 9090 Temp: 97.7 F (36.5 C) Resp: 17 BP: (!) 121/94 SpO2: 100 %  EBL: None  Complications: No immediate post-treatment complications observed by team, or reported by patient.  Note: Patient was very anxious and tearful throughout the procedure.  Nursing staff present to provide support and reassurance.  She was asked on multiple occasions before starting the procedure if she wanted to continue given that she was crying and she consented to yes.  The patient tolerated the entire procedure well. A repeat set of vitals were taken after the procedure and the patient was kept under observation following institutional policy, for this type of procedure. Post-procedural  neurological assessment was performed, showing return to baseline, prior to discharge. The patient was provided with post-procedure discharge instructions, including a section on how to identify potential problems. Should any  problems arise concerning this procedure, the patient was given instructions to immediately contact us , at any time, without hesitation. In any case, we plan to contact the patient by telephone for a follow-up status report regarding this interventional procedure.  Comments:  No additional relevant information.  Plan of Care (POC)  Orders:  Orders Placed This Encounter  Procedures   DG PAIN CLIN[facility] C-ARM 1-60 MIN NO REPORT    Intraoperative interpretation by procedural physician at [facility].    Standing Status:   Standing    Number of Occurrences:   1    Reason for exam::   Assistance in needle guidance and placement for procedures requiring needle placement in or near specific anatomical locations not easily accessible without such assistance.     Medications ordered for procedure: Meds ordered this encounter  Medications   iohexol  (OMNIPAQUE ) 180 MG/ML injection 10 mL    Must be Myelogram-compatible. If not available, you may substitute with a water-soluble, non-ionic, hypoallergenic, myelogram-compatible radiological contrast medium.   lidocaine  (XYLOCAINE ) 2 % (with pres) injection 400 mg   dexamethasone (DECADRON) injection 10 mg   DISCONTD: methylPREDNISolone  acetate (DEPO-MEDROL ) injection 80 mg   ropivacaine  (PF) 2 mg/mL (0.2%) (NAROPIN ) injection 9 mL   ropivacaine  (PF) 2 mg/mL (0.2%) (NAROPIN ) injection 9 mL   dexamethasone (DECADRON) injection 10 mg   diazepam (VALIUM) tablet 10 mg    Make sure Flumazenil is available in the pyxis when using this medication. If oversedation occurs, administer 0.2 mg IV over 15 sec. If after 45 sec no response, administer 0.2 mg again over 1 min; may repeat at 1 min intervals; not to exceed 4 doses (1 mg)    Medications administered: We administered iohexol , lidocaine , dexamethasone, ropivacaine  (PF) 2 mg/mL (0.2%), ropivacaine  (PF) 2 mg/mL (0.2%), dexamethasone, and diazepam.  See the medical record for exact dosing, route, and time of administration.    Follow-up plan:   Return in about 4 weeks (around 07/19/2023) for PPE, F2F.     Recent Visits Date Type Provider Dept  06/01/23 Office Visit Cephus Collin, MD [facility]  Showing recent visits within past 90 days and meeting all other requirements Today's Visits Date Type Provider Dept  06/21/23 Procedure visit Cephus Collin, MD [facility]  Showing today's visits and meeting all other requirements Future Appointments Date Type Provider Dept  07/27/23 Appointment Cephus Collin, MD [facility]  Showing future appointments within next 90 days and meeting all other requirements   Disposition: Discharge home  Discharge (Date  Time): 06/21/2023; 1420 hrs.   Primary Care Physician: Will Hare, NP Location: [facility] Note by: Cephus Collin, MD (TTS technology used. I apologize for any typographical errors that were not detected and corrected.) Date: 06/21/2023; Time: 2:26 PM  Disclaimer:  Medicine is not an Visual merchandiser. The only guarantee in medicine is that nothing is guaranteed. It is important to note that the decision to proceed with this intervention was based on the information collected from the patient. The Data and conclusions were drawn from the patient's questionnaire, the interview, and the physical examination. Because the information was provided in large part by the patient, it cannot be guaranteed that it has not been purposely or unconsciously manipulated. Every effort has been made to obtain as much relevant data as possible for this evaluation. It is important to note that the conclusions that lead to this procedure are derived in large part from the  available data.  Always take into account that the treatment will also be dependent on availability of resources and existing treatment guidelines, considered by other Pain Management Practitioners as being common knowledge and practice, at the time of the intervention. For Medico-Legal purposes, it is also important to point out that variation in procedural techniques and pharmacological choices are the acceptable norm. The indications, contraindications, technique, and results of the above procedure should only be interpreted and judged by a Board-Certified Interventional Pain Specialist with extensive familiarity and expertise in the same exact procedure and technique.

## 2023-06-21 NOTE — Patient Instructions (Signed)
Sacroiliac (SI) Joint Injection Patient Information  Description: The sacroiliac joint connects the scrum (very low back and tailbone) to the ilium (a pelvic bone which also forms half of the hip joint).  Normally this joint experiences very little motion.  When this joint becomes inflamed or unstable low back and or hip and pelvis pain may result.  Injection of this joint with local anesthetics (numbing medicines) and steroids can provide diagnostic information and reduce pain.  This injection is performed with the aid of x-ray guidance into the tailbone area while you are lying on your stomach.   You may experience an electrical sensation down the leg while this is being done.  You may also experience numbness.  We also may ask if we are reproducing your normal pain during the injection.  Conditions which may be treated SI injection:  Low back, buttock, hip or leg pain  Preparation for the Injection:  Do not eat any solid food or dairy products within 8 hours of your appointment.  You may drink clear liquids up to 3 hours before appointment.  Clear liquids include water, black coffee, juice or soda.  No milk or cream please. You may take your regular medications, including pain medications with a sip of water before your appointment.  Diabetics should hold regular insulin (if take separately) and take 1/2 normal NPH dose the morning of the procedure.  Carry some sugar containing items with you to your appointment. A driver must accompany you and be prepared to drive you home after your procedure. Bring all of your current medications with you. An IV may be inserted and sedation may be given at the discretion of the physician. A blood pressure cuff, EKG and other monitors will often be applied during the procedure.  Some patients may need to have extra oxygen administered for a short period.  You will be asked to provide medical information, including your allergies, prior to the procedure.  We  must know immediately if you are taking blood thinners (like Coumadin/Warfarin) or if you are allergic to IV iodine contrast (dye).  We must know if you could possible be pregnant.  Possible side effects:  Bleeding from needle site Infection (rare, may require surgery) Nerve injury (rare) Numbness & tingling (temporary) A brief convulsion or seizure Light-headedness (temporary) Pain at injection site (several days) Decreased blood pressure (temporary) Weakness in the leg (temporary)   Call if you experience:  New onset weakness or numbness of an extremity below the injection site that last more than 8 hours. Hives or difficulty breathing ( go to the emergency room) Inflammation or drainage at the injection site Any new symptoms which are concerning to you  Please note:  Although the local anesthetic injected can often make your back/ hip/ buttock/ leg feel good for several hours after the injections, the pain will likely return.  It takes 3-7 days for steroids to work in the sacroiliac area.  You may not notice any pain relief for at least that one week.  If effective, we will often do a series of three injections spaced 3-6 weeks apart to maximally decrease your pain.  After the initial series, we generally will wait some months before a repeat injection of the same type.  If you have any questions, please call [phone] [facility]   

## 2023-06-22 ENCOUNTER — Telehealth: Payer: Self-pay

## 2023-06-22 NOTE — Telephone Encounter (Signed)
 Post procedure follow up.  LM

## 2023-06-24 ENCOUNTER — Encounter: Payer: Self-pay | Admitting: Student in an Organized Health Care Education/Training Program

## 2023-07-09 ENCOUNTER — Ambulatory Visit: Admitting: Physician Assistant

## 2023-07-13 ENCOUNTER — Ambulatory Visit (INDEPENDENT_AMBULATORY_CARE_PROVIDER_SITE_OTHER): Admitting: Physician Assistant

## 2023-07-13 VITALS — BP 124/80 | Ht 62.0 in | Wt 170.0 lb

## 2023-07-13 DIAGNOSIS — M48061 Spinal stenosis, lumbar region without neurogenic claudication: Secondary | ICD-10-CM

## 2023-07-13 DIAGNOSIS — M5416 Radiculopathy, lumbar region: Secondary | ICD-10-CM | POA: Diagnosis not present

## 2023-07-13 DIAGNOSIS — M545 Low back pain, unspecified: Secondary | ICD-10-CM | POA: Diagnosis not present

## 2023-07-13 DIAGNOSIS — G8929 Other chronic pain: Secondary | ICD-10-CM | POA: Diagnosis not present

## 2023-07-13 NOTE — Progress Notes (Unsigned)
 Referring Physician:  Harvey Gaetana CROME, NP [address] [place],  [place] [address]  Primary Physician:  Harvey Gaetana CROME, NP  History of Present Illness: 07/13/23  Saw patient in clinic today for continued low back pain that extends into bilateral buttocks and hip.  Since she was last seen she has undergone physical therapy, started new Remicade infusion for her psoriatic arthritis, and underwent an injection which has not helped her pain.  She continues to use over-the-counter medication with little help.   HPI [name] is here today with a history of psoriatic arthritis who comes today with a chief complaint of back pain.  She states that it is constant and extends into bilateral buttocks and hip.  She states at times it will continue radiating into her legs but does not advance past the middle of her thigh.  She intermittently has numbness and tingling however it is not constant.  She feels as though she can never get comfortable and her legs are feeling weaker.  She denies any falls.  She states that physical therapy has helped some but her pain ranges from a dull ache to a screaming pain.  She has been using Advil , Tylenol , and tramadol to help with her pain.  No saddle anesthesia.    Duration: worsened over the past 4-6 months Severity: 6/10  Precipitating: aggravated by housework, standing/sitting for longer than 10 minutes Modifying factors: made better by resting Weakness: none Timing: constant Bowel/Bladder Dysfunction: none  Conservative measures:  Physical therapy: has participated in at [facility], this was helping at first but now the pain has worsened. She has requested a few more sessions, so not technically been discharged Multimodal medical therapy including regular antiinflammatories: tramadol Injections: no epidural steroid injections  Past Surgery: no spinal surgeries  Rose Howard has no symptoms of cervical myelopathy.  The symptoms are  causing a significant impact on the patient's life.   Review of Systems:  A 10 point review of systems is negative, except for the pertinent positives and negatives detailed in the HPI.  Past Medical History: Past Medical History:  Diagnosis Date   Abnormal liver enzymes    Anxiety    Depression    Dysmenorrhea    GERD (gastroesophageal reflux disease)    History of COVID-19    Migraine     Past Surgical History: Past Surgical History:  Procedure Laterality Date   BREAST SURGERY Bilateral 2014   Breast Reduction   LIVER BIOPSY  08/10/14    Allergies: Allergies as of 07/13/2023 - Review Complete 06/21/2023  Allergen Reaction Noted   Gelatein mct [compleat] Itching 04/23/2023    Medications: Outpatient Encounter Medications as of 07/13/2023  Medication Sig   busPIRone (BUSPAR) 5 MG tablet Take 5 mg by mouth 2 (two) times daily.   dicyclomine  (BENTYL ) 20 MG tablet Take 1 tablet (20 mg total) by mouth 2 (two) times daily. (Patient not taking: Reported on 06/21/2023)   escitalopram (LEXAPRO) 10 MG tablet Take 10 mg by mouth daily.   famotidine (PEPCID) 10 MG tablet Take 10 mg by mouth 2 (two) times daily.   HUMIRA, 2 PEN, 40 MG/0.4ML pen Inject into the skin. (Patient not taking: Reported on 06/21/2023)   inFLIXimab (REMICADE IV) Inject 1 application  into the vein every 8 (eight) weeks. Titrating up to every 8 weeks.   Magnesium 200 MG TABS Take by mouth.   methylphenidate (DAYTRANA) 10 mg/9hr patch Place 1 patch onto the skin daily. (Patient  not taking: Reported on 06/21/2023)   methylphenidate 18 MG PO CR tablet Take 18 mg by mouth daily.   ondansetron  (ZOFRAN ) 4 MG tablet Take 4 mg by mouth every 8 (eight) hours as needed.   ondansetron  (ZOFRAN -ODT) 4 MG disintegrating tablet Take 1 tablet (4 mg total) by mouth every 8 (eight) hours as needed.   pantoprazole  (PROTONIX ) 40 MG tablet Take by mouth.   predniSONE  (STERAPRED UNI-PAK 21 TAB) 10 MG (21) TBPK tablet Take by mouth daily.  Take 6 tabs by mouth daily  for 1 days, then 5 tabs for 1 days, then 4 tabs for 1 days, then 3 tabs for 1 days, 2 tabs for 1 days, then 1 tab by mouth daily for 1 days (Patient not taking: Reported on 06/21/2023)   promethazine  (PHENERGAN ) 12.5 MG suppository Place 1 suppository (12.5 mg total) rectally every 6 (six) hours as needed for nausea or vomiting. (Patient not taking: Reported on 06/21/2023)   VITAMIN D , ERGOCALCIFEROL , PO Take 1 capsule by mouth once a week.   No facility-administered encounter medications on file as of 07/13/2023.    Social History: Social History   Tobacco Use   Smoking status: Never   Smokeless tobacco: Never  Vaping Use   Vaping status: Never Used  Substance Use Topics   Alcohol use: Not Currently   Drug use: Not Currently    Types: Marijuana    Family Medical History: Family History  Problem Relation Age of Onset   Hypercholesterolemia Mother    Heart disease Father    Alcohol abuse Father    Breast cancer Paternal Grandmother        107s   Colon cancer Paternal Grandmother        57s   Ovarian cancer Neg Hx     Physical Examination: @VITALWITHPAIN @  General: Patient is well developed, well nourished, calm, collected, and in no apparent distress. Attention to examination is appropriate.  Psychiatric: Patient is non-anxious.  Head:  Pupils equal, round, and reactive to light.  ENT:  Oral mucosa appears well hydrated.  Neck:   Supple.  Full range of motion.  Respiratory: Patient is breathing without any difficulty.  Extremities: No edema.  Vascular: Palpable dorsal pedal pulses.  Skin:   On exposed skin, there are no abnormal skin lesions.  NEUROLOGICAL:     Awake, alert, oriented to person, place, and time.  Speech is clear and fluent. Fund of knowledge is appropriate.   Cranial Nerves: Pupils equal round and reactive to light.  Facial tone is symmetric.   ROM of spine: Some mild tenderness palpation of her lumbar  spine.    Strength:  Side Iliopsoas Quads Hamstring PF DF EHL  R 5 5 5 5 5 5   L 5 5 5 5 5 5    Reflexes are 2+ and symmetric at the patella and achilles.   Clonus is not present.  Toes are down-going.  Bilateral upper and lower extremity sensation is intact to light touch.    Gait is normal.   No difficulty with tandem gait.   No evidence of dysmetria noted.  Medical Decision Making  Imaging: MRI Lumbar spine without contrast 04/14/23:  IMPRESSION:  1. 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  2.  Otherwise, no significant spinal canal or foraminal stenosis in the  lumbar spine.    I have personally reviewed the  images and agree with the above interpretation.  Assessment and Plan: [name] is a pleasant 28 y.o. female is here today with a history of psoriatic  arthritis who comes today for follow-up for back pain extending into bilateral buttocks and hip radiating into the thigh.  This is continued despite continuing her psoriatic arthritis injections, undergoing physical therapy, and undergoing a SI joint injection.  He was a pleasure to see patient in clinic today.  Luckily, she remains without weakness however would like to discuss this case further with Dr. Claudene for any potential further neurosurgical intervention.  Advised patient to continue Remicade injections as instructed and continue over-the-counter management.  Will reach back out to her with any changes to the plan.   Thank you for involving me in the care of this patient.   I spent a total of 30 minutes in both face-to-face and non-face-to-face activities for this visit on the date of this encounter including repairing to the patient, obtaining and reviewing history, performing medically appropriate examination, counseling the patient, documenting, and care coronation.  Lyle Decamp, PA-C Dept. of Neurosurgery

## 2023-07-15 NOTE — Progress Notes (Deleted)
 Duplicate note

## 2023-07-19 ENCOUNTER — Encounter: Payer: Self-pay | Admitting: Neurosurgery

## 2023-07-19 ENCOUNTER — Ambulatory Visit: Admitting: Neurosurgery

## 2023-07-19 VITALS — BP 122/82 | Ht 62.0 in | Wt 170.4 lb

## 2023-07-19 DIAGNOSIS — M48061 Spinal stenosis, lumbar region without neurogenic claudication: Secondary | ICD-10-CM

## 2023-07-19 DIAGNOSIS — G8929 Other chronic pain: Secondary | ICD-10-CM | POA: Diagnosis not present

## 2023-07-19 DIAGNOSIS — M545 Low back pain, unspecified: Secondary | ICD-10-CM | POA: Diagnosis not present

## 2023-07-19 NOTE — Progress Notes (Deleted)
Entered in duplicate

## 2023-07-19 NOTE — Progress Notes (Signed)
 Referring Physician:  Harvey Gaetana CROME, NP [address] [place],  [place] [address]  Primary Physician:  Harvey Gaetana CROME, NP  History of Present Illness: 07/19/23 Patient is following up today after seeing Lyle Butters.  She has been worked up for low back pain, was referred to our pain team who attempted SI joint injections.  Patient did not have a significant improvement from these.  She has a L5-S1 disc that she has been following.  To complicate matters she does have a history of psoriatic arthritis and has had some diffuse bone and joint pain from that understandably.  At this point she has not had any significant help with her control.  Conservative measures:  Physical therapy: has participated in at [facility], this was helping at first but now the pain has worsened. She has requested a few more sessions, so not technically been discharged Multimodal medical therapy including regular antiinflammatories: tramadol Injections: no epidural steroid injections  Past Surgery: no spinal surgeries  Silvano DEL Stiver has no symptoms of cervical myelopathy.  The symptoms are causing a significant impact on the patient's life.   Review of Systems:  A 10 point review of systems is negative, except for the pertinent positives and negatives detailed in the HPI.  Past Medical History: Past Medical History:  Diagnosis Date   Abnormal liver enzymes    Anxiety    Depression    Dysmenorrhea    GERD (gastroesophageal reflux disease)    History of COVID-19    Migraine     Past Surgical History: Past Surgical History:  Procedure Laterality Date   BREAST SURGERY Bilateral 2014   Breast Reduction   LIVER BIOPSY  08/10/14    Allergies: Allergies as of 07/19/2023 - Review Complete 07/13/2023  Allergen Reaction Noted   Gelatein mct [compleat] Itching 04/23/2023    Medications: Outpatient Encounter Medications as of 07/19/2023  Medication Sig   busPIRone (BUSPAR) 5 MG tablet Take  5 mg by mouth 2 (two) times daily.   escitalopram (LEXAPRO) 10 MG tablet Take 10 mg by mouth daily.   famotidine (PEPCID) 10 MG tablet Take 10 mg by mouth 2 (two) times daily.   inFLIXimab (REMICADE IV) Inject 1 application  into the vein every 8 (eight) weeks. Titrating up to every 8 weeks.   Magnesium 200 MG TABS Take by mouth.   methylphenidate 18 MG PO CR tablet Take 18 mg by mouth daily.   ondansetron  (ZOFRAN ) 4 MG tablet Take 4 mg by mouth every 8 (eight) hours as needed.   ondansetron  (ZOFRAN -ODT) 4 MG disintegrating tablet Take 1 tablet (4 mg total) by mouth every 8 (eight) hours as needed.   pantoprazole  (PROTONIX ) 40 MG tablet Take by mouth.   VITAMIN D , ERGOCALCIFEROL , PO Take 1 capsule by mouth once a week.   No facility-administered encounter medications on file as of 07/19/2023.    Social History: Social History   Tobacco Use   Smoking status: Never   Smokeless tobacco: Never  Vaping Use   Vaping status: Never Used  Substance Use Topics   Alcohol use: Not Currently   Drug use: Not Currently    Types: Marijuana    Family Medical History: Family History  Problem Relation Age of Onset   Hypercholesterolemia Mother    Heart disease Father    Alcohol abuse Father    Breast cancer Paternal Grandmother        25s   Colon cancer Paternal Grandmother  70s   Ovarian cancer Neg Hx     Physical Examination:NEUROLOGICAL:     Awake, alert, oriented to person, place, and time.  Speech is clear and fluent. Fund of knowledge is appropriate.   Cranial Nerves: Pupils equal round and reactive to light.  Facial tone is symmetric.   ROM of spine: Some mild tenderness palpation of her lumbar spine.  Strength:  Side Iliopsoas Quads Hamstring PF DF EHL  R 5 5 5 5 5 5   L 5 5 5 5 5 5    Reflexes are 2+ and symmetric at the patella and achilles.   Clonus is not present.  Toes are down-going.   Bilateral upper and lower extremity sensation is intact to light touch.      Gait is normal.    Medical Decision Making  Imaging: MRI Lumbar spine without contrast 04/14/23:  IMPRESSION:  1. 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  2.  Otherwise, no significant spinal canal or foraminal stenosis in the  lumbar spine.    I have personally reviewed the images and agree with the above interpretation.  Assessment and Plan: [name] is a pleasant 28 y.o. female is here today with a history of psoriatic  arthritis who comes today for follow-up for back pain extending into bilateral buttocks and hip radiating into the thigh.  She was recently seen in our pain clinic and had SI joint injections which did not give her significant amount of relief.  We did discuss that sometimes pain radiating from back into her buttocks can come from disc herniations, however this is usually accompanied by pain radiating down the lower extremity.  She is straight leg raise negative.  She has not had significant weakness.  No sensory loss.  She does have a significant disc collapse at L5-S1 with disc herniation causing displacement of the traversing S1 nerve root.  At this point I would avoid any surgical intervention, I would like to get more workup.  This could either be done with a diagnostic injection at L5-S1 versus a EMG nerve conduction study.  She would like to minimize needle exposure so she would prefer the lumbar transforaminal epidural spinal injection.  She would like to try new pain office closer to home so we will refer her to [place].  I spent a total of 30 minutes in both face-to-face and non-face-to-face activities for this visit on the date of this encounter including repairing to the patient, obtaining and reviewing history, performing medically appropriate examination, counseling the patient, documenting, and care coronation.  Penne MICAEL Sharps, MD Dept. of Neurosurgery
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
## 2023-07-27 ENCOUNTER — Ambulatory Visit: Admitting: Student in an Organized Health Care Education/Training Program

## 2023-07-28 ENCOUNTER — Encounter
Admission: RE | Admit: 2023-07-28 | Discharge: 2023-07-28 | Disposition: A | Discharge status: Home / Self Care | Source: Ambulatory Visit | Attending: Obstetrics and Gynecology | Admitting: Obstetrics and Gynecology

## 2023-07-28 ENCOUNTER — Other Ambulatory Visit: Payer: Self-pay

## 2023-07-28 DIAGNOSIS — Z01818 Encounter for other preprocedural examination: Secondary | ICD-10-CM

## 2023-07-28 HISTORY — DX: Attention-deficit hyperactivity disorder, unspecified type: F90.9

## 2023-07-28 HISTORY — DX: Hyperlipidemia, unspecified: E78.5

## 2023-07-28 HISTORY — DX: Pelvic and perineal pain unspecified side: R10.20

## 2023-07-28 HISTORY — DX: Pelvic and perineal pain: R10.2

## 2023-07-28 HISTORY — DX: Obstructive sleep apnea (adult) (pediatric): G47.33

## 2023-07-28 HISTORY — DX: Other specified postprocedural states: Z98.890

## 2023-07-28 HISTORY — DX: Elevated carcinoembryonic antigen (CEA): R97.0

## 2023-07-28 HISTORY — DX: Nausea with vomiting, unspecified: R11.2

## 2023-07-28 HISTORY — DX: Arthropathic psoriasis, unspecified: L40.50

## 2023-07-28 HISTORY — DX: Cardiac murmur, unspecified: R01.1

## 2023-07-28 HISTORY — DX: Abnormal uterine and vaginal bleeding, unspecified: N93.9

## 2023-07-28 NOTE — Patient Instructions (Signed)
 Your procedure is scheduled on:08-05-23 Thursday Report to the Registration Desk on the 1st floor of the [facility].Then proceed to the 2nd floor Surgery Desk To find out your arrival time, please call [phone] between 1PM - 3PM on:08-04-23 Wednesday If your arrival time is 6:00 am, do not arrive before that time as the [facility] entrance doors do not open until 6:00 am.  REMEMBER: Instructions that are not followed completely may result in serious medical risk, up to and including death; or upon the discretion of your surgeon and anesthesiologist your surgery may need to be rescheduled.  Do not eat food after midnight the night before surgery.  No gum chewing or hard candies.  You may however, drink CLEAR liquids up to 2 hours before you are scheduled to arrive for your surgery. Do not drink anything within 2 hours of your scheduled arrival time.  Clear liquids include: - water  - apple juice without pulp - gatorade (not RED colors) - black coffee or tea (Do NOT add milk or creamers to the coffee or tea) Do NOT drink anything that is not on this list  In addition, your doctor has ordered for you to drink the provided:  Ensure Pre-Surgery Clear Carbohydrate Drink  Drinking this carbohydrate drink up to two hours before surgery helps to reduce insulin resistance and improve patient outcomes. Please complete drinking 2 hours before scheduled arrival time.  One week prior to surgery:Stop NOW (07-28-23) Stop Anti-inflammatories (NSAIDS) such as Advil , Aleve, Ibuprofen , Motrin , Naproxen, Naprosyn and Aspirin based products such as Excedrin, Goody's Powder, BC Powder. Stop ANY OVER THE COUNTER supplements until after surgery (Magnesium)  You may however, continue to take Tylenol  if needed for pain up until the day of surgery.  Continue taking all of your other prescription medications up until the day of surgery.  ON THE DAY OF SURGERY ONLY TAKE THESE MEDICATIONS WITH SIPS OF  WATER: -busPIRone (BUSPAR)  -pantoprazole  (PROTONIX )   No Alcohol for 24 hours before or after surgery.  No Smoking including e-cigarettes for 24 hours before surgery.  No chewable tobacco products for at least 6 hours before surgery.  No nicotine patches on the day of surgery.  Do not use any recreational drugs for at least a week (preferably 2 weeks) before your surgery.  Please be advised that the combination of cocaine and anesthesia may have negative outcomes, up to and including death. If you test positive for cocaine, your surgery will be cancelled.  On the morning of surgery brush your teeth with toothpaste and water, you may rinse your mouth with mouthwash if you wish. Do not swallow any toothpaste or mouthwash.  Use CHG Soap as directed on instruction sheet.  Do not wear jewelry, make-up, hairpins, clips or nail polish.  For welded (permanent) jewelry: bracelets, anklets, waist bands, etc.  Please have this removed prior to surgery.  If it is not removed, there is a chance that hospital personnel will need to cut it off on the day of surgery.  Do not wear lotions, powders, or perfumes.   Do not shave body hair from the neck down 48 hours before surgery.  Contact lenses, hearing aids and dentures may not be worn into surgery.  Do not bring valuables to the hospital. [facility] is not responsible for any missing/lost belongings or valuables.   Bring your C-PAP to the hospital  Notify your doctor if there is any change in your medical condition (cold, fever, infection).  Wear  comfortable clothing (specific to your surgery type) to the hospital.  After surgery, you can help prevent lung complications by doing breathing exercises.  Take deep breaths and cough every 1-2 hours. Your doctor may order a device called an Incentive Spirometer to help you take deep breaths. When coughing or sneezing, hold a pillow firmly against your incision with both hands. This is called  "splinting." Doing this helps protect your incision. It also decreases belly discomfort.  If you are being admitted to the hospital overnight, leave your suitcase in the car. After surgery it may be brought to your room.  In case of increased patient census, it may be necessary for you, the patient, to continue your postoperative care in the Same Day Surgery department.  If you are being discharged the day of surgery, you will not be allowed to drive home. You will need a responsible individual to drive you home and stay with you for 24 hours after surgery.   If you are taking public transportation, you will need to have a responsible individual with you.  Please call the Pre-admissions Testing Dept. at [phone] if you have any questions about these instructions.  Surgery Visitation Policy:  Patients having surgery or a procedure may have two visitors.  Children under the age of 5 must have an adult with them who is not the patient.     Preparing for Surgery with CHLORHEXIDINE GLUCONATE (CHG) Soap  Chlorhexidine Gluconate (CHG) Soap  o An antiseptic cleaner that kills germs and bonds with the skin to continue killing germs even after washing  o Used for showering the night before surgery and morning of surgery  Before surgery, you can play an important role by reducing the number of germs on your skin.  CHG (Chlorhexidine gluconate) soap is an antiseptic cleanser which kills germs and bonds with the skin to continue killing germs even after washing.  Please do not use if you have an allergy to CHG or antibacterial soaps. If your skin becomes reddened/irritated stop using the CHG.  1. Shower the NIGHT BEFORE SURGERY and the MORNING OF SURGERY with CHG soap.  2. If you choose to wash your hair, wash your hair first as usual with your normal shampoo.  3. After shampooing, rinse your hair and body thoroughly to remove the shampoo.  4. Use CHG as you would any other liquid  soap. You can apply CHG directly to the skin and wash gently with a scrungie or a clean washcloth.  5. Apply the CHG soap to your body only from the neck down. Do not use on open wounds or open sores. Avoid contact with your eyes, ears, mouth, and genitals (private parts). Wash face and genitals (private parts) with your normal soap.  6. Wash thoroughly, paying special attention to the area where your surgery will be performed.  7. Thoroughly rinse your body with warm water.  8. Do not shower/wash with your normal soap after using and rinsing off the CHG soap.  9. Pat yourself dry with a clean towel.  10. Wear clean pajamas to bed the night before surgery.  12. Place clean sheets on your bed the night of your first shower and do not sleep with pets.  13. Shower again with the CHG soap on the day of surgery prior to arriving at the hospital.  14. Do not apply any deodorants/lotions/powders.  15. Please wear clean clothes to the hospital.   [employer] to address health-related social needs:  [email or web address]

## 2023-08-03 ENCOUNTER — Encounter
Admission: RE | Admit: 2023-08-03 | Discharge: 2023-08-03 | Disposition: A | Discharge status: Home / Self Care | Source: Ambulatory Visit | Attending: Obstetrics and Gynecology | Admitting: Obstetrics and Gynecology

## 2023-08-03 DIAGNOSIS — K219 Gastro-esophageal reflux disease without esophagitis: Secondary | ICD-10-CM | POA: Diagnosis not present

## 2023-08-03 DIAGNOSIS — Z01818 Encounter for other preprocedural examination: Secondary | ICD-10-CM

## 2023-08-03 DIAGNOSIS — F32A Depression, unspecified: Secondary | ICD-10-CM | POA: Diagnosis not present

## 2023-08-03 DIAGNOSIS — R102 Pelvic and perineal pain: Secondary | ICD-10-CM | POA: Diagnosis not present

## 2023-08-03 DIAGNOSIS — R7401 Elevation of levels of liver transaminase levels: Secondary | ICD-10-CM | POA: Diagnosis not present

## 2023-08-03 DIAGNOSIS — L4050 Arthropathic psoriasis, unspecified: Secondary | ICD-10-CM | POA: Diagnosis not present

## 2023-08-03 DIAGNOSIS — N393 Stress incontinence (female) (male): Secondary | ICD-10-CM | POA: Diagnosis not present

## 2023-08-03 DIAGNOSIS — F419 Anxiety disorder, unspecified: Secondary | ICD-10-CM | POA: Diagnosis not present

## 2023-08-03 DIAGNOSIS — N939 Abnormal uterine and vaginal bleeding, unspecified: Secondary | ICD-10-CM | POA: Diagnosis present

## 2023-08-03 DIAGNOSIS — E669 Obesity, unspecified: Secondary | ICD-10-CM | POA: Diagnosis not present

## 2023-08-03 DIAGNOSIS — Z6831 Body mass index (BMI) 31.0-31.9, adult: Secondary | ICD-10-CM | POA: Diagnosis not present

## 2023-08-03 LAB — TYPE AND SCREEN
ABO/RH(D): O POS
Antibody Screen: NEGATIVE

## 2023-08-03 LAB — CBC
HCT: 41.5 % (ref 36.0–46.0)
Hemoglobin: 13.6 g/dL (ref 12.0–15.0)
MCH: 28.1 pg (ref 26.0–34.0)
MCHC: 32.8 g/dL (ref 30.0–36.0)
MCV: 85.7 fL (ref 80.0–100.0)
Platelets: 343 K/uL (ref 150–400)
RBC: 4.84 MIL/uL (ref 3.87–5.11)
RDW: 13.4 % (ref 11.5–15.5)
WBC: 4.5 K/uL (ref 4.0–10.5)
nRBC: 0 % (ref 0.0–0.2)

## 2023-08-05 ENCOUNTER — Ambulatory Visit
Admission: RE | Admit: 2023-08-05 | Discharge: 2023-08-05 | Disposition: A | Discharge status: Home / Self Care | Attending: Obstetrics and Gynecology | Admitting: Obstetrics and Gynecology

## 2023-08-05 ENCOUNTER — Other Ambulatory Visit: Payer: Self-pay

## 2023-08-05 ENCOUNTER — Ambulatory Visit: Payer: Self-pay | Admitting: Urgent Care

## 2023-08-05 ENCOUNTER — Ambulatory Visit: Admitting: Anesthesiology

## 2023-08-05 ENCOUNTER — Encounter: Payer: Self-pay | Admitting: Obstetrics and Gynecology

## 2023-08-05 ENCOUNTER — Encounter
Admission: RE | Disposition: A | Discharge status: Home / Self Care | Payer: Self-pay | Source: Home / Self Care | Attending: Obstetrics and Gynecology

## 2023-08-05 DIAGNOSIS — R7401 Elevation of levels of liver transaminase levels: Secondary | ICD-10-CM | POA: Insufficient documentation

## 2023-08-05 DIAGNOSIS — N939 Abnormal uterine and vaginal bleeding, unspecified: Secondary | ICD-10-CM | POA: Insufficient documentation

## 2023-08-05 DIAGNOSIS — Z01818 Encounter for other preprocedural examination: Secondary | ICD-10-CM

## 2023-08-05 DIAGNOSIS — F419 Anxiety disorder, unspecified: Secondary | ICD-10-CM | POA: Insufficient documentation

## 2023-08-05 DIAGNOSIS — K219 Gastro-esophageal reflux disease without esophagitis: Secondary | ICD-10-CM | POA: Insufficient documentation

## 2023-08-05 DIAGNOSIS — R102 Pelvic and perineal pain: Secondary | ICD-10-CM | POA: Insufficient documentation

## 2023-08-05 DIAGNOSIS — F32A Depression, unspecified: Secondary | ICD-10-CM | POA: Insufficient documentation

## 2023-08-05 DIAGNOSIS — N393 Stress incontinence (female) (male): Secondary | ICD-10-CM | POA: Insufficient documentation

## 2023-08-05 DIAGNOSIS — Z6831 Body mass index (BMI) 31.0-31.9, adult: Secondary | ICD-10-CM | POA: Insufficient documentation

## 2023-08-05 DIAGNOSIS — E669 Obesity, unspecified: Secondary | ICD-10-CM | POA: Insufficient documentation

## 2023-08-05 DIAGNOSIS — L4050 Arthropathic psoriasis, unspecified: Secondary | ICD-10-CM | POA: Insufficient documentation

## 2023-08-05 HISTORY — PX: CYSTOSCOPY: SHX5120

## 2023-08-05 HISTORY — PX: HYSTERECTOMY, TOTAL, LAPAROSCOPIC, ROBOT-ASSISTED WITH SALPINGECTOMY: SHX7587

## 2023-08-05 LAB — POCT PREGNANCY, URINE: Preg Test, Ur: NEGATIVE

## 2023-08-05 SURGERY — HYSTERECTOMY, TOTAL, LAPAROSCOPIC, ROBOT-ASSISTED WITH SALPINGECTOMY
Anesthesia: General | Site: Uterus

## 2023-08-05 MED ORDER — METRONIDAZOLE 500 MG/100ML IV SOLN
500.0000 mg | Freq: Once | INTRAVENOUS | Status: AC
  Administered 2023-08-05: 500 mg via INTRAVENOUS
  Filled 2023-08-05: qty 100

## 2023-08-05 MED ORDER — OXYCODONE HCL 5 MG PO TABS
5.0000 mg | ORAL_TABLET | Freq: Once | ORAL | Status: AC | PRN
  Administered 2023-08-05: 5 mg via ORAL

## 2023-08-05 MED ORDER — KETAMINE HCL 10 MG/ML IJ SOLN
INTRAMUSCULAR | Status: DC | PRN
  Administered 2023-08-05 (×3): 10 mg via INTRAVENOUS

## 2023-08-05 MED ORDER — HEMOSTATIC AGENTS (NO CHARGE) OPTIME
TOPICAL | Status: DC | PRN
  Administered 2023-08-05: 1 via TOPICAL

## 2023-08-05 MED ORDER — SCOPOLAMINE 1 MG/3DAYS TD PT72
1.0000 | MEDICATED_PATCH | TRANSDERMAL | Status: DC
  Administered 2023-08-05: 1.5 mg via TRANSDERMAL

## 2023-08-05 MED ORDER — FUROSEMIDE 10 MG/ML IJ SOLN
INTRAMUSCULAR | Status: DC | PRN
  Administered 2023-08-05: 10 mg via INTRAMUSCULAR

## 2023-08-05 MED ORDER — HYDROMORPHONE HCL 1 MG/ML IJ SOLN
INTRAMUSCULAR | Status: AC
  Filled 2023-08-05: qty 1

## 2023-08-05 MED ORDER — ROCURONIUM BROMIDE 100 MG/10ML IV SOLN
INTRAVENOUS | Status: DC | PRN
  Administered 2023-08-05: 20 mg via INTRAVENOUS
  Administered 2023-08-05: 50 mg via INTRAVENOUS
  Administered 2023-08-05: 30 mg via INTRAVENOUS

## 2023-08-05 MED ORDER — FENTANYL CITRATE (PF) 100 MCG/2ML IJ SOLN
INTRAMUSCULAR | Status: AC
  Filled 2023-08-05: qty 2

## 2023-08-05 MED ORDER — PROPOFOL 10 MG/ML IV BOLUS
INTRAVENOUS | Status: DC | PRN
  Administered 2023-08-05: 150 mg via INTRAVENOUS

## 2023-08-05 MED ORDER — ROCURONIUM BROMIDE 10 MG/ML (PF) SYRINGE
PREFILLED_SYRINGE | INTRAVENOUS | Status: AC
  Filled 2023-08-05: qty 10

## 2023-08-05 MED ORDER — DEXMEDETOMIDINE HCL IN NACL 80 MCG/20ML IV SOLN
INTRAVENOUS | Status: AC
  Filled 2023-08-05: qty 20

## 2023-08-05 MED ORDER — DROPERIDOL 2.5 MG/ML IJ SOLN
INTRAMUSCULAR | Status: AC
  Filled 2023-08-05: qty 2

## 2023-08-05 MED ORDER — PROPOFOL 500 MG/50ML IV EMUL
INTRAVENOUS | Status: DC | PRN
  Administered 2023-08-05: 50 ug/kg/min via INTRAVENOUS

## 2023-08-05 MED ORDER — OXYCODONE HCL 5 MG/5ML PO SOLN: 5.0000 mg | Freq: Once | ORAL | Status: AC | PRN

## 2023-08-05 MED ORDER — DROPERIDOL 2.5 MG/ML IJ SOLN
0.6250 mg | Freq: Once | INTRAMUSCULAR | Status: AC
  Administered 2023-08-05: 0.625 mg via INTRAVENOUS

## 2023-08-05 MED ORDER — ORAL CARE MOUTH RINSE: 15.0000 mL | Freq: Once | OROMUCOSAL | Status: AC

## 2023-08-05 MED ORDER — SODIUM CHLORIDE 0.9 % IR SOLN
Status: DC | PRN
Start: 2023-08-05 — End: 2023-08-05
  Administered 2023-08-05: 500 mL

## 2023-08-05 MED ORDER — ACETAMINOPHEN 500 MG PO TABS
ORAL_TABLET | ORAL | Status: AC
  Filled 2023-08-05: qty 2

## 2023-08-05 MED ORDER — CEFAZOLIN SODIUM-DEXTROSE 2-4 GM/100ML-% IV SOLN
2.0000 g | INTRAVENOUS | Status: AC
  Administered 2023-08-05: 2 g via INTRAVENOUS
  Filled 2023-08-05: qty 100

## 2023-08-05 MED ORDER — BUPIVACAINE HCL (PF) 0.5 % IJ SOLN
INTRAMUSCULAR | Status: DC | PRN
  Administered 2023-08-05: 30 mL

## 2023-08-05 MED ORDER — SODIUM CHLORIDE 0.9 % IV SOLN: INTRAVENOUS | Status: DC

## 2023-08-05 MED ORDER — DEXMEDETOMIDINE HCL IN NACL 80 MCG/20ML IV SOLN
INTRAVENOUS | Status: DC | PRN
  Administered 2023-08-05: 4 ug via INTRAVENOUS

## 2023-08-05 MED ORDER — BUPIVACAINE HCL (PF) 0.5 % IJ SOLN
INTRAMUSCULAR | Status: AC
  Filled 2023-08-05: qty 30

## 2023-08-05 MED ORDER — MIDAZOLAM HCL 2 MG/2ML IJ SOLN
INTRAMUSCULAR | Status: AC
  Filled 2023-08-05: qty 2

## 2023-08-05 MED ORDER — EPHEDRINE 5 MG/ML INJ
INTRAVENOUS | Status: AC
Start: 2023-08-05 — End: 2023-08-05
  Filled 2023-08-05: qty 5

## 2023-08-05 MED ORDER — HYDROMORPHONE HCL 1 MG/ML IJ SOLN
INTRAMUSCULAR | Status: AC
Start: 2023-08-05 — End: 2023-08-05
  Filled 2023-08-05: qty 1

## 2023-08-05 MED ORDER — CEFAZOLIN SODIUM-DEXTROSE 2-4 GM/100ML-% IV SOLN
INTRAVENOUS | Status: AC
Start: 2023-08-05 — End: 2023-08-05
  Filled 2023-08-05: qty 100

## 2023-08-05 MED ORDER — ONDANSETRON HCL 4 MG/2ML IJ SOLN
INTRAMUSCULAR | Status: AC
  Filled 2023-08-05: qty 2

## 2023-08-05 MED ORDER — ONDANSETRON HCL 4 MG/2ML IJ SOLN
INTRAMUSCULAR | Status: DC | PRN
  Administered 2023-08-05: 4 mg via INTRAVENOUS

## 2023-08-05 MED ORDER — SODIUM CHLORIDE 0.9 % IR SOLN
Status: DC | PRN
Start: 2023-08-05 — End: 2023-08-05
  Administered 2023-08-05: 300 mL

## 2023-08-05 MED ORDER — KETAMINE HCL 50 MG/5ML IJ SOSY
PREFILLED_SYRINGE | INTRAMUSCULAR | Status: AC
  Filled 2023-08-05: qty 5

## 2023-08-05 MED ORDER — LIDOCAINE HCL (CARDIAC) PF 100 MG/5ML IV SOSY
PREFILLED_SYRINGE | INTRAVENOUS | Status: DC | PRN
  Administered 2023-08-05: 80 mg via INTRAVENOUS

## 2023-08-05 MED ORDER — LACTATED RINGERS IV SOLN: INTRAVENOUS | Status: DC

## 2023-08-05 MED ORDER — PROPOFOL 1000 MG/100ML IV EMUL
INTRAVENOUS | Status: AC
  Filled 2023-08-05: qty 100

## 2023-08-05 MED ORDER — FENTANYL CITRATE (PF) 100 MCG/2ML IJ SOLN
INTRAMUSCULAR | Status: DC | PRN
  Administered 2023-08-05 (×2): 25 ug via INTRAVENOUS
  Administered 2023-08-05: 50 ug via INTRAVENOUS

## 2023-08-05 MED ORDER — LIDOCAINE HCL (PF) 2 % IJ SOLN
INTRAMUSCULAR | Status: AC
  Filled 2023-08-05: qty 5

## 2023-08-05 MED ORDER — SCOPOLAMINE 1 MG/3DAYS TD PT72
MEDICATED_PATCH | TRANSDERMAL | Status: AC
  Filled 2023-08-05: qty 1

## 2023-08-05 MED ORDER — OXYCODONE HCL 5 MG PO TABS
ORAL_TABLET | ORAL | Status: AC
Start: 2023-08-05 — End: 2023-08-05
  Filled 2023-08-05: qty 1

## 2023-08-05 MED ORDER — SUGAMMADEX SODIUM 200 MG/2ML IV SOLN
INTRAVENOUS | Status: DC | PRN
  Administered 2023-08-05: 200 mg via INTRAVENOUS

## 2023-08-05 MED ORDER — DEXAMETHASONE SODIUM PHOSPHATE 10 MG/ML IJ SOLN
INTRAMUSCULAR | Status: DC | PRN
  Administered 2023-08-05: 10 mg via INTRAVENOUS

## 2023-08-05 MED ORDER — CHLORHEXIDINE GLUCONATE 0.12 % MT SOLN
15.0000 mL | Freq: Once | OROMUCOSAL | Status: AC
  Administered 2023-08-05: 15 mL via OROMUCOSAL
  Filled 2023-08-05: qty 15

## 2023-08-05 MED ORDER — KETOROLAC TROMETHAMINE 30 MG/ML IJ SOLN
INTRAMUSCULAR | Status: DC | PRN
  Administered 2023-08-05: 30 mg via INTRAVENOUS

## 2023-08-05 MED ORDER — DEXAMETHASONE SODIUM PHOSPHATE 10 MG/ML IJ SOLN
INTRAMUSCULAR | Status: AC
  Filled 2023-08-05: qty 1

## 2023-08-05 MED ORDER — 0.9 % SODIUM CHLORIDE (POUR BTL) OPTIME
TOPICAL | Status: DC | PRN
  Administered 2023-08-05: 500 mL

## 2023-08-05 MED ORDER — MIDAZOLAM HCL 2 MG/2ML IJ SOLN
INTRAMUSCULAR | Status: DC | PRN
  Administered 2023-08-05: 2 mg via INTRAVENOUS

## 2023-08-05 MED ORDER — HYDROMORPHONE HCL 1 MG/ML IJ SOLN
0.2500 mg | INTRAMUSCULAR | Status: DC | PRN
  Administered 2023-08-05 (×4): 0.5 mg via INTRAVENOUS

## 2023-08-05 MED ORDER — ACETAMINOPHEN 500 MG PO TABS
1000.0000 mg | ORAL_TABLET | ORAL | Status: AC
  Administered 2023-08-05: 1000 mg via ORAL
  Filled 2023-08-05: qty 2

## 2023-08-05 MED ORDER — POVIDONE-IODINE 10 % EX SWAB
2.0000 | Freq: Once | CUTANEOUS | Status: AC
Start: 2023-08-05 — End: 2023-08-05
  Administered 2023-08-05: 2 via TOPICAL

## 2023-08-05 MED ORDER — PROPOFOL 10 MG/ML IV BOLUS
INTRAVENOUS | Status: AC
  Filled 2023-08-05: qty 20

## 2023-08-05 SURGICAL SUPPLY — 52 items
APPLICATOR ARISTA FLEXITIP XL (MISCELLANEOUS) IMPLANT
BAG URINE DRAIN 2000ML AR STRL (UROLOGICAL SUPPLIES) ×2 IMPLANT
BENZOIN TINCTURE PRP APPL 2/3 (GAUZE/BANDAGES/DRESSINGS) ×2 IMPLANT
BLADE SURG SZ11 CARB STEEL (BLADE) ×2 IMPLANT
CANNULA CAP OBTURATR AIRSEAL 8 (CAP) ×2 IMPLANT
CATH URTH 16FR FL 2W BLN LF (CATHETERS) ×2 IMPLANT
COUNTER NDL MAGNETIC 40 RED (SET/KITS/TRAYS/PACK) ×2 IMPLANT
COUNTER NEEDLE MAGNETIC 40 RED (SET/KITS/TRAYS/PACK) ×2 IMPLANT
COVER TIP SHEARS 8 DVNC (MISCELLANEOUS) ×2 IMPLANT
DRAPE ARM DVNC X/XI (DISPOSABLE) ×8 IMPLANT
DRAPE COLUMN DVNC XI (DISPOSABLE) ×2 IMPLANT
DRAPE SHEET LG 3/4 BI-LAMINATE (DRAPES) ×2 IMPLANT
DRIVER NDL MEGA 8 DVNC XI (INSTRUMENTS) ×2 IMPLANT
DRIVER NDL MEGA SUTCUT DVNCXI (INSTRUMENTS) ×2 IMPLANT
DRIVER NDLE MEGA DVNC XI (INSTRUMENTS) ×2 IMPLANT
DRIVER NDLE MEGA SUTCUT DVNCXI (INSTRUMENTS) ×2 IMPLANT
DRSG TEGADERM 2-3/8X2-3/4 SM (GAUZE/BANDAGES/DRESSINGS) ×8 IMPLANT
ELECTRODE REM PT RTRN 9FT ADLT (ELECTROSURGICAL) ×2 IMPLANT
FORCEPS BPLR FENES DVNC XI (FORCEP) ×2 IMPLANT
GAUZE 4X4 16PLY ~~LOC~~+RFID DBL (SPONGE) ×2 IMPLANT
GAUZE SPONGE 2X2 STRL 8-PLY (GAUZE/BANDAGES/DRESSINGS) ×4 IMPLANT
GLOVE SURG SYN 6.5 PF PI (GLOVE) ×8 IMPLANT
GOWN STRL REUS W/ TWL LRG LVL3 (GOWN DISPOSABLE) ×8 IMPLANT
HEMOSTAT ARISTA ABSORB 1G (HEMOSTASIS) IMPLANT
IRRIGATION STRYKERFLOW (MISCELLANEOUS) IMPLANT
IV NS 1000ML BAXH (IV SOLUTION) IMPLANT
KIT PINK PAD W/HEAD ARM REST (MISCELLANEOUS) ×2 IMPLANT
LABEL OR SOLS (LABEL) ×2 IMPLANT
MANIFOLD NEPTUNE II (INSTRUMENTS) ×2 IMPLANT
MANIPULATOR VCARE LG CRV RETR (MISCELLANEOUS) IMPLANT
MANIPULATOR VCARE SML CRV RETR (MISCELLANEOUS) IMPLANT
MANIPULATOR VCARE STD CRV RETR (MISCELLANEOUS) IMPLANT
NS IRRIG 1000ML POUR BTL (IV SOLUTION) ×2 IMPLANT
OBTURATOR OPTICALSTD 8 DVNC (TROCAR) ×2 IMPLANT
OCCLUDER COLPOPNEUMO (BALLOONS) ×2 IMPLANT
PACK GYN LAPAROSCOPIC (MISCELLANEOUS) ×2 IMPLANT
PAD OB MATERNITY 11 LF (PERSONAL CARE ITEMS) ×2 IMPLANT
PAD PREP OB/GYN DISP 24X41 (PERSONAL CARE ITEMS) ×2 IMPLANT
POWDER SURGICEL 3.0 GRAM (HEMOSTASIS) IMPLANT
SCRUB CHG 4% DYNA-HEX 4OZ (MISCELLANEOUS) ×2 IMPLANT
SEAL UNIV 5-12 XI (MISCELLANEOUS) ×6 IMPLANT
SEALER VESSEL EXT DVNC XI (MISCELLANEOUS) ×2 IMPLANT
SET CYSTO W/LG BORE CLAMP LF (SET/KITS/TRAYS/PACK) IMPLANT
SET TUBE FILTERED XL AIRSEAL (SET/KITS/TRAYS/PACK) ×2 IMPLANT
SOLUTION ELECTROSURG ANTI STCK (MISCELLANEOUS) ×2 IMPLANT
STRIP CLOSURE SKIN 1/4X4 (GAUZE/BANDAGES/DRESSINGS) ×2 IMPLANT
SURGILUBE 2OZ TUBE FLIPTOP (MISCELLANEOUS) ×2 IMPLANT
SUT STRATAFIX SPIRAL PDS+ 0 30 (SUTURE) ×2 IMPLANT
SUTURE MNCRL 4-0 27XMF (SUTURE) ×2 IMPLANT
SYR 50ML LL SCALE MARK (SYRINGE) ×2 IMPLANT
TIP ENDOSCOPIC SURGICEL (TIP) IMPLANT
WATER STERILE IRR 500ML POUR (IV SOLUTION) ×2 IMPLANT

## 2023-08-05 NOTE — Interval H&P Note (Signed)
 History and Physical Interval Note:  08/05/2023 9:44 AM  Rose Howard Sees  has presented today for surgery, with the diagnosis of abnormal uterine bleeding pelvic pain.  The various methods of treatment have been discussed with the patient and family. After consideration of risks, benefits and other options for treatment, the patient has consented to  Procedure(s): HYSTERECTOMY, TOTAL, LAPAROSCOPIC, ROBOT-ASSISTED WITH SALPINGECTOMY (Bilateral) CYSTOSCOPY (N/A) as a surgical intervention.  The patient's history has been reviewed, patient examined, no change in status, stable for surgery.  I have reviewed the patient's chart and labs.  Questions were answered to the patient's satisfaction.     Khyle Goodell V Rose Howard

## 2023-08-05 NOTE — Anesthesia Procedure Notes (Signed)
 Procedure Name: Intubation Date/Time: 08/05/2023 9:58 AM  Performed by: Dyane Mass, CRNAPre-anesthesia Checklist: Patient identified, Emergency Drugs available, Suction available and Patient being monitored Patient Re-evaluated:Patient Re-evaluated prior to induction Oxygen Delivery Method: Circle system utilized Preoxygenation: Pre-oxygenation with 100% oxygen Induction Type: IV induction Ventilation: Mask ventilation without difficulty Laryngoscope Size: Mac and 3 Grade View: Grade I Tube type: Oral Tube size: 7.0 mm Number of attempts: 1 Airway Equipment and Method: Stylet and Oral airway Placement Confirmation: ETT inserted through vocal cords under direct vision, positive ETCO2 and breath sounds checked- equal and bilateral Secured at: 22 cm Tube secured with: Tape Dental Injury: Teeth and Oropharynx as per pre-operative assessment

## 2023-08-05 NOTE — Anesthesia Preprocedure Evaluation (Signed)
 Anesthesia Evaluation  Patient identified by MRN, date of birth, ID band Patient awake    Reviewed: Allergy & Precautions, NPO status , Patient's Chart, lab work & pertinent test results  History of Anesthesia Complications (+) PONV and history of anesthetic complications  Airway Mallampati: III  TM Distance: >3 FB Neck ROM: full    Dental no notable dental hx.    Pulmonary neg pulmonary ROS   Pulmonary exam normal        Cardiovascular negative cardio ROS Normal cardiovascular exam     Neuro/Psych  Headaches PSYCHIATRIC DISORDERS Anxiety Depression     Neuromuscular disease    GI/Hepatic Neg liver ROS,GERD  ,,  Endo/Other  negative endocrine ROS    Renal/GU      Musculoskeletal   Abdominal   Peds  Hematology negative hematology ROS (+)   Anesthesia Other Findings Past Medical History: No date: Abnormal liver enzymes No date: Abnormal uterine bleeding (AUB) No date: ADHD (attention deficit hyperactivity disorder) No date: Anxiety No date: Depression No date: Dysmenorrhea No date: Elevated carcinoembryonic antigen (CEA) No date: GERD (gastroesophageal reflux disease) No date: Heart murmur No date: History of COVID-19 No date: History of liver biopsy No date: Hyperlipidemia No date: Migraine No date: OSA on CPAP No date: Pelvic pain No date: PONV (postoperative nausea and vomiting)     Comment:  very nauseated after breast reduction No date: Psoriatic arthritis [facility])  Past Surgical History: 2014: BREAST SURGERY; Bilateral     Comment:  Breast Reduction 08/10/14: LIVER BIOPSY  BMI    Body Mass Index: 31.09 kg/m      Reproductive/Obstetrics negative OB ROS                              Anesthesia Physical Anesthesia Plan  ASA: 2  Anesthesia Plan: General ETT   Post-op Pain Management: Toradol  IV (intra-op)*, Tylenol  PO (pre-op)*, Dilaudid  IV, Ketamine  IV* and Precedex     Induction: Intravenous  PONV Risk Score and Plan: 4 or greater and Ondansetron , Dexamethasone , Midazolam , Treatment may vary due to age or medical condition and Scopolamine  patch - Pre-op  Airway Management Planned: Oral ETT  Additional Equipment:   Intra-op Plan:   Post-operative Plan: Extubation in OR  Informed Consent: I have reviewed the patients History and Physical, chart, labs and discussed the procedure including the risks, benefits and alternatives for the proposed anesthesia with the patient or authorized representative who has indicated his/her understanding and acceptance.     Dental Advisory Given  Plan Discussed with: Anesthesiologist, CRNA and Surgeon  Anesthesia Plan Comments: (Patient consented for risks of anesthesia including but not limited to:  - adverse reactions to medications - damage to eyes, teeth, lips or other oral mucosa - nerve damage due to positioning  - sore throat or hoarseness - Damage to heart, brain, nerves, lungs, other parts of body or loss of life  Patient voiced understanding and assent.)        Anesthesia Quick Evaluation

## 2023-08-05 NOTE — Op Note (Signed)
 OPERATIVE NOTE   DATE OF SERVICE:  08/05/2023 NAME: Rose Howard MRN: [id number]   PREOPERATIVE DIAGNOSES: Abnormal uterine bleeding, pelvic pain   POSTOPERATIVE DIAGNOSES: Same   PROCEDURE: Robot assisted total laparoscopic hysterectomy, bilateral salpingectomy, and cystoscopy    ANESTHESIA: GETA   SURGEON:  Beverli Dinsmore, MD ASSISTANT: Garnette Mace, MD   Findings: Small mobile uterus and normal appearing bilateral fallopian tubes and ovaries. Right colon adhesion to the right sidewall but otherwise no adhesions. Bowel, peritoneum, and omentum normal to the extent seen. Cystoscopy with squamous metaplasia at the trigone but otherwise normal with brisk bilateral efflux from ureteral jets and no obvious defects, suture, erythema or other abnormalities in the bladder mucosa.   Estimated blood loss in the OR: 25cc   Fluids: 700cc   UOP: 400cc   COMPLICATIONS:None.   SPECIMENS:Uterus, cervix, and bilateral fallopian tubes   DESCRIPTION OF PROCEDURE: Patient was taken to the OR, where general anesthesia was induced. Patient was placed in a dorsal lithotomy position.  Abdomen and vagina were prepped with chlorhexidine  and draped in the usual sterile fashion.  A transurethral Foley catheter was placed. A speculum was placed and the cervix visualized. A tenaculum was placed on the cervix and the cervix was dilated to accommodate a Orthoptist. This was placed on the cervix and all other vaginal instruments were removed without difficulty. Surgeon's gloves were changed and attention was turned to the abdomen.    First, 5cc of 0.5% Marcaine  was injected at the intended incision site supraumbilical. A 5 mm incision was made and using direct visualization the peritoneal cavity was entered. The cavity was insufflated with CO2 gas and the laparoscope reinserted confirming abdominal entry.  The pressure was set at 15 mm Hg. Another 5cc of 0.5% Marcaine  was injected at intended left  mid-abdominal site, approximately 10cm laterally. An 8mm incision was made and robot port was placed. The same was performed approximately 10cm laterally on the right. The patient was placed in 26 degrees of Trendelenburg and the peritoneal contents were inspected with the above-noted findings. The robot was docked with monopolar scissors in the right hand and a bipolar foreceps in the left hand.    The surgeon went to the surgeon console where the surgery was continued. The left fallopian tube was detached starting at the fimbriae and traveling along the mesosalpinx until the cornua was reached. The left round ligament was then then ligated and transected.The left uteroovarian ligament was then cauterized and cut.  The left ureter was identified and noted to be well below.  The left broad ligament was then dissected down to the level of the internal os. The right fallopian tube was detached starting at the fimbriae and traveling along the mesosalpinx until the cornua was reached. The right round ligament was then then ligated and transected. The right uteroovarian ligament was then cauterized and cut.  The right ureter was identified and noted to be well below. The right broad ligament was then dissected down to the level of the internal os. A bladder flap was then created and the bladder bluntly pushed down until safely away from the cervix. The right uterine artery was skeletonized, cauterized, and cut. The same was performed on the left.     The colpotomy incision was started posteriorly and was taken around the complete circumference until the specimen was completely detached and taken out through the vagina. The abdomen was suctioned and irrigated. Hemostasis noted.    The vaginal cuff was closed  using a running 0 Stratafix barbed suture careful to incorporate the peritoneum; two layer closure was used. The pelvis was irrigated. Hemostasis was noted. Arista was placed over the cuff. The pressure was  dropped to and continued hemostasis was noted. The robotic instruments were removed.  The robot was undocked. The abdomen was deflated and trocars were removed using a Q tip. The skin at all 4 port sites was closed with subcuticular 4-0 Monocryl in a running fashion. The incisions were covered with 2x2 and Tegaderm dressings.   The foley catheter was removed. Transurethral bladder cystoscopy was performed. Brisk efflux from bilateral UOs noted.  No injuries were noted to the bladder.     The vagina was inspected to ensure no vaginal lacerations present. The cuff was noted to be intact and hemostatic. All instruments were removed.    The patient tolerated the procedure well.  All counts were correct. General anesthesia was reversed and patient was extubated. Patient was in stable condition at time of MD leaving OR.    Beverli LULLA Dinsmore, MD, 08/05/2023, 12:07 PM

## 2023-08-05 NOTE — Anesthesia Postprocedure Evaluation (Signed)
 Anesthesia Post Note  Patient: Rose Howard  Procedure(s) Performed: HYSTERECTOMY, TOTAL, LAPAROSCOPIC, ROBOT-ASSISTED WITH SALPINGECTOMY (Bilateral: Uterus) CYSTOSCOPY (Bladder)  Patient location during evaluation: PACU Anesthesia Type: General Level of consciousness: awake and alert Pain management: pain level controlled Vital Signs Assessment: post-procedure vital signs reviewed and stable Respiratory status: spontaneous breathing, nonlabored ventilation, respiratory function stable and patient connected to nasal cannula oxygen Cardiovascular status: blood pressure returned to baseline and stable Postop Assessment: no apparent nausea or vomiting Anesthetic complications: no   No notable events documented.   Last Vitals:  Vitals:   08/05/23 1303 08/05/23 1315  BP: 114/68 (!) 95/54  Pulse: 75 66  Resp: 14 13  Temp:    SpO2: 97% 91%    Last Pain:  Vitals:   08/05/23 1305  TempSrc:   PainSc: 6                  Rose Howard

## 2023-08-05 NOTE — Transfer of Care (Signed)
 Immediate Anesthesia Transfer of Care Note  Patient: Rose Howard  Procedure(s) Performed: HYSTERECTOMY, TOTAL, LAPAROSCOPIC, ROBOT-ASSISTED WITH SALPINGECTOMY (Bilateral: Uterus) CYSTOSCOPY (Bladder)  Patient Location: PACU  Anesthesia Type:General  Level of Consciousness: awake and patient cooperative  Airway & Oxygen Therapy: Patient Spontanous Breathing and Patient connected to face mask oxygen  Post-op Assessment: Report given to RN and Post -op Vital signs reviewed and stable  Post vital signs: Reviewed and stable  Last Vitals:  Vitals Value Taken Time  BP 96/72   Temp    Pulse 65   Resp 16   SpO2 99%     Last Pain:  Vitals:   08/05/23 0852  TempSrc: Temporal  PainSc: 3       Patients Stated Pain Goal: 1 (08/05/23 0852)  Complications: No notable events documented.

## 2023-08-06 ENCOUNTER — Ambulatory Visit: Payer: Self-pay | Admitting: Obstetrics and Gynecology

## 2023-08-06 ENCOUNTER — Encounter: Payer: Self-pay | Admitting: Obstetrics and Gynecology

## 2023-08-06 LAB — SURGICAL PATHOLOGY

## 2023-10-25 ENCOUNTER — Ambulatory Visit
Admission: RE | Admit: 2023-10-25 | Discharge: 2023-10-25 | Disposition: A | Discharge status: Home / Self Care | Payer: Self-pay | Source: Ambulatory Visit

## 2023-10-25 VITALS — BP 106/69 | HR 71 | Temp 98.8°F | Resp 18

## 2023-10-25 DIAGNOSIS — J069 Acute upper respiratory infection, unspecified: Secondary | ICD-10-CM | POA: Diagnosis not present

## 2023-10-25 MED ORDER — PREDNISONE 10 MG (21) PO TBPK: ORAL_TABLET | Freq: Every day | ORAL | 0 refills | Status: AC

## 2023-10-25 MED ORDER — ALBUTEROL SULFATE HFA 108 (90 BASE) MCG/ACT IN AERS: 2.0000 | INHALATION_SPRAY | Freq: Four times a day (QID) | RESPIRATORY_TRACT | 0 refills | Status: AC | PRN

## 2023-10-25 MED ORDER — AMOXICILLIN-POT CLAVULANATE 875-125 MG PO TABS: 1.0000 | ORAL_TABLET | Freq: Two times a day (BID) | ORAL | 0 refills | Status: AC

## 2023-10-25 NOTE — ED Provider Notes (Signed)
 [facility]    CSN: [id number] Arrival date & time: 10/25/23  1001      History   Chief Complaint Chief Complaint  Patient presents with   Ear Fullness    I think it may be sinus related, but I have had a headache for 3 weeks, can hear fluid moving in my ear, coughing up a lot of yellow phlegm, and it just won't go away - Entered by patient   Cough    HPI Rose Howard is a 28 y.o. female.   Patient presents for evaluation of intermittent headaches, bilateral ear fullness with drainage, productive cough, shortness of breath at rest exacerbated by exertion, sinus pressure to the center of the face, a sore throat only occurring in the morning and nausea without vomiting present for 3 weeks.  No known sick contacts prior.  Decreased appetite but tolerable to some food and liquids.  Has attempted use of Advil  and antihistamines.  Denies fever, wheezing.  Denies respiratory history, non-smoker.  Past Medical History:  Diagnosis Date   Abnormal liver enzymes    Abnormal uterine bleeding (AUB)    ADHD (attention deficit hyperactivity disorder)    Anxiety    Depression    Dysmenorrhea    Elevated carcinoembryonic antigen (CEA)    GERD (gastroesophageal reflux disease)    Heart murmur    History of COVID-19    History of liver biopsy    Hyperlipidemia    Migraine    OSA on CPAP    Pelvic pain    PONV (postoperative nausea and vomiting)    very nauseated after breast reduction   Psoriatic arthritis [facility])     Patient Active Problem List   Diagnosis Date Noted   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   Piriformis syndrome of left side 06/01/2023   Rubella non-immune status, antepartum 09/17/2019   Term pregnancy 09/15/2019   Pregnancy 09/14/2019   Amniotic fluid index increased 07/26/2019   History of liver biopsy 02/05/2019   Sacroiliac joint pain 07/25/2018   Nonallopathic lesion of sacral region 07/25/2018   Nonallopathic lesion of thoracic region 07/25/2018    Nonallopathic lesion of lumbosacral region 07/25/2018   Elevated LFTs 11/03/2016   Bloating 11/03/2016   Abnormal liver enzymes 07/31/2014   Acid reflux 07/31/2014   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   Cardiac murmur 07/31/2014   Avitaminosis D 07/31/2014   HLD (hyperlipidemia) 07/31/2014   Elevated carcinoembryonic antigen 07/31/2014   Anxiety and depression 07/12/2014   Dysmenorrhea 07/12/2014   Contraception management 07/12/2014    Past Surgical History:  Procedure Laterality Date   BREAST SURGERY Bilateral 2014   Breast Reduction   CYSTOSCOPY N/A 08/05/2023   Procedure: CYSTOSCOPY;  Surgeon: Schermerhorn, Beverli GAILS, MD;  Location: [facility] ORS;  Service: Gynecology;  Laterality: N/A;   HYSTERECTOMY, TOTAL, LAPAROSCOPIC, ROBOT-ASSISTED WITH SALPINGECTOMY Bilateral 08/05/2023   Procedure: HYSTERECTOMY, TOTAL, LAPAROSCOPIC, ROBOT-ASSISTED WITH SALPINGECTOMY;  Surgeon: Schermerhorn, Beverli GAILS, MD;  Location: [facility] OR[facility];  Service: Gynecology;  Laterality: Bilateral;   LIVER BIOPSY  08/10/14    OB History     Gravida  2   Para  2   Term  2   Preterm  0   AB  0   Living  2      SAB  0   IAB  0   Ectopic  0   Multiple  0   Live Births  2            Home Medications  Prior to Admission medications   Medication Sig Start Date End Date Taking? Authorizing Provider  albuterol  (VENTOLIN  HFA) 108 (90 Base) MCG/ACT inhaler Inhale 2 puffs into the lungs every 6 (six) hours as needed for wheezing or shortness of breath. 10/25/23  Yes Cheila Wickstrom, Shelba SAUNDERS, NP  amoxicillin -clavulanate (AUGMENTIN) 875-125 MG tablet Take 1 tablet by mouth every 12 (twelve) hours. 10/25/23  Yes Deron Poole R, NP  escitalopram (LEXAPRO) 20 MG tablet Take 20 mg by mouth at bedtime. 10/11/23  Yes [provider]  predniSONE  (STERAPRED UNI-PAK 21 TAB) 10 MG (21) TBPK tablet Take by mouth daily. Take 6 tabs by mouth daily  for 1 days, then 5 tabs for 1 days, then 4 tabs for 1 days, then 3 tabs  for 1 days, 2 tabs for 1 days, then 1 tab by mouth daily for 1 days 10/25/23  Yes Anaysia Germer R, NP  busPIRone (BUSPAR) 10 MG tablet Take 10 mg by mouth 2 (two) times daily. 07/15/23   [provider]  carboxymethylcellulose (REFRESH PLUS) 0.5 % SOLN Place 1 drop into both eyes 3 (three) times daily as needed (dry/irritated eyes.).    [provider]  escitalopram (LEXAPRO) 10 MG tablet Take 15 mg by mouth at bedtime.    [provider]  famotidine (PEPCID) 20 MG tablet Take 20 mg by mouth at bedtime as needed for indigestion or heartburn.    [provider]  ibuprofen  (ADVIL ) 200 MG tablet Take 600 mg by mouth daily as needed (pain.).    [provider]  inFLIXimab (REMICADE IV) Inject 1 application  into the vein every 8 (eight) weeks. Titrating up to every 8 weeks.    [provider]  Magnesium 250 MG CAPS Take 250 mg by mouth 3 (three) times a week.    [provider]  methylphenidate 27 MG PO CR tablet Take 27 mg by mouth in the morning.    [provider]  pantoprazole  (PROTONIX ) 40 MG tablet Take 40 mg by mouth in the morning. 03/31/22 07/22/26  [provider]    Family History Family History  Problem Relation Age of Onset   Hypercholesterolemia Mother    Heart disease Father    Alcohol abuse Father    Breast cancer Paternal Grandmother        33s   Colon cancer Paternal Grandmother        42s   Ovarian cancer Neg Hx     Social History Social History   Tobacco Use   Smoking status: Never   Smokeless tobacco: Never  Vaping Use   Vaping status: Never Used  Substance Use Topics   Alcohol use: Yes    Comment: rare   Drug use: Not Currently    Types: Marijuana    Comment: rare occ     Allergies   Gelatein mct [compleat]   Review of Systems Review of Systems   Physical Exam Triage Vital Signs ED Triage Vitals  Encounter Vitals Group     BP 10/25/23 1011 106/69     Girls Systolic BP  Percentile --      Girls Diastolic BP Percentile --      Boys Systolic BP Percentile --      Boys Diastolic BP Percentile --      Pulse Rate 10/25/23 1011 71     Resp 10/25/23 1011 18     Temp 10/25/23 1011 98.8 F (37.1 C)     Temp Source 10/25/23 1011  Oral     SpO2 10/25/23 1011 98 %     Weight --      Height --      Head Circumference --      Peak Flow --      Pain Score 10/25/23 1007 0     Pain Loc --      Pain Education --      Exclude from Growth Chart --    No data found.  Updated Vital Signs BP 106/69 (BP Location: Right Arm)   Pulse 71   Temp 98.8 F (37.1 C) (Oral)   Resp 18   LMP 07/21/2023   SpO2 98%   Visual Acuity Right Eye Distance:   Left Eye Distance:   Bilateral Distance:    Right Eye Near:   Left Eye Near:    Bilateral Near:     Physical Exam Constitutional:      Appearance: Normal appearance.  HENT:     Head: Normocephalic.     Right Ear: Tympanic membrane, ear canal and external ear normal.     Left Ear: Tympanic membrane, ear canal and external ear normal.     Nose: Congestion present.     Right Sinus: No maxillary sinus tenderness or frontal sinus tenderness.     Left Sinus: No maxillary sinus tenderness or frontal sinus tenderness.     Mouth/Throat:     Pharynx: No posterior oropharyngeal erythema.  Eyes:     Extraocular Movements: Extraocular movements intact.  Cardiovascular:     Rate and Rhythm: Normal rate and regular rhythm.     Pulses: Normal pulses.     Heart sounds: Normal heart sounds.  Pulmonary:     Effort: Pulmonary effort is normal.     Breath sounds: Normal breath sounds.  Neurological:     Mental Status: She is alert and oriented to person, place, and time. Mental status is at baseline.      UC Treatments / Results  Labs (all labs ordered are listed, but only abnormal results are displayed) Labs Reviewed - No data to display  EKG   Radiology No results found.  Procedures Procedures (including critical  care time)  Medications Ordered in UC Medications - No data to display  Initial Impression / Assessment and Plan / UC Course  I have reviewed the triage vital signs and the nursing notes.  Pertinent labs & imaging results that were available during my care of the patient were reviewed by me and considered in my medical decision making (see chart for details).  Acute URI  Patient is in no signs of distress nor toxic appearing.  Vital signs are stable.  Low suspicion for pneumonia, pneumothorax or bronchitis and therefore will defer imaging.  Symptoms persisting for 3 weeks without signs of resolution, placed on antibiotics, prescribed Augmentin, additionally prescribed prednisone  and albuterol  inhaler for management of shortness of breath.May use additional over-the-counter medications as needed for supportive care.  May follow-up with urgent care as needed if symptoms persist or worsen.  Final Clinical Impressions(s) / UC Diagnoses   Final diagnoses:  Acute URI     Discharge Instructions      Begin Augmentin twice daily for 7 days to treat bacteria most likely causing symptoms to linger  You may begin prednisone  every morning with food as directed to reduce inflammation throughout the body and help with shortness of breath and ear fullness, avoid ibuprofen  while taking but may use Tylenol   You may use inhaler taking  2 puffs every 6 hours as needed for shortness of breath  You can take Tylenol   as needed for fever reduction and pain relief.   For cough: honey 1/2 to 1 teaspoon (you can dilute the honey in water or another fluid).  You can also use guaifenesin and dextromethorphan for cough. You can use a humidifier for chest congestion and cough.  If you don't have a humidifier, you can sit in the bathroom with the hot shower running.      For sore throat: try warm salt water gargles, cepacol lozenges, throat spray, warm tea or water with lemon/honey, popsicles or ice, or OTC cold  relief medicine for throat discomfort.   For congestion: take a daily anti-histamine like Zyrtec, Claritin, and a oral decongestant, such as pseudoephedrine.  You can also use Flonase 1-2 sprays in each nostril daily.   It is important to stay hydrated: drink plenty of fluids (water, gatorade/powerade/pedialyte, juices, or teas) to keep your throat moisturized and help further relieve irritation/discomfort.    ED Prescriptions     Medication Sig Dispense Auth. Provider   amoxicillin -clavulanate (AUGMENTIN) 875-125 MG tablet Take 1 tablet by mouth every 12 (twelve) hours. 14 tablet Othell Jaime R, NP   predniSONE  (STERAPRED UNI-PAK 21 TAB) 10 MG (21) TBPK tablet Take by mouth daily. Take 6 tabs by mouth daily  for 1 days, then 5 tabs for 1 days, then 4 tabs for 1 days, then 3 tabs for 1 days, 2 tabs for 1 days, then 1 tab by mouth daily for 1 days 21 tablet Clemence Lengyel R, NP   albuterol  (VENTOLIN  HFA) 108 (90 Base) MCG/ACT inhaler Inhale 2 puffs into the lungs every 6 (six) hours as needed for wheezing or shortness of breath. 6.7 g Teresa Shelba SAUNDERS, NP      PDMP not reviewed this encounter.   Teresa Shelba SAUNDERS, NP 10/25/23 1036

## 2023-10-25 NOTE — ED Triage Notes (Signed)
 Patient reports cough with yellow phlegm and ear fullness x 3 weeks. Patient reports she has been taking Advil  with no relief.

## 2023-10-25 NOTE — Discharge Instructions (Signed)
 Begin Augmentin twice daily for 7 days to treat bacteria most likely causing symptoms to linger  You may begin prednisone  every morning with food as directed to reduce inflammation throughout the body and help with shortness of breath and ear fullness, avoid ibuprofen  while taking but may use Tylenol   You may use inhaler taking 2 puffs every 6 hours as needed for shortness of breath  You can take Tylenol   as needed for fever reduction and pain relief.   For cough: honey 1/2 to 1 teaspoon (you can dilute the honey in water or another fluid).  You can also use guaifenesin and dextromethorphan for cough. You can use a humidifier for chest congestion and cough.  If you don't have a humidifier, you can sit in the bathroom with the hot shower running.      For sore throat: try warm salt water gargles, cepacol lozenges, throat spray, warm tea or water with lemon/honey, popsicles or ice, or OTC cold relief medicine for throat discomfort.   For congestion: take a daily anti-histamine like Zyrtec, Claritin, and a oral decongestant, such as pseudoephedrine.  You can also use Flonase 1-2 sprays in each nostril daily.   It is important to stay hydrated: drink plenty of fluids (water, gatorade/powerade/pedialyte, juices, or teas) to keep your throat moisturized and help further relieve irritation/discomfort.

## 2023-11-10 ENCOUNTER — Ambulatory Visit
Admission: EM | Admit: 2023-11-10 | Discharge: 2023-11-10 | Disposition: A | Discharge status: Home / Self Care | Attending: Emergency Medicine | Admitting: Emergency Medicine

## 2023-11-10 ENCOUNTER — Ambulatory Visit (INDEPENDENT_AMBULATORY_CARE_PROVIDER_SITE_OTHER)

## 2023-11-10 DIAGNOSIS — M25552 Pain in left hip: Secondary | ICD-10-CM

## 2023-11-10 NOTE — ED Provider Notes (Signed)
 Rose Howard    CSN: [id number] Arrival date & time: 11/10/23  0932      History   Chief Complaint Chief Complaint  Patient presents with   Hip Pain    HPI CHRISSA Howard is a 28 y.o. female.  Patient presents with 2-day history of left hip pain.  No falls or trauma.  Patient states the pain started after having strenuous sex.  The pain is constant but gets worse with movement and improves somewhat with rest.  No bruising, redness, wounds, swelling, numbness, weakness, paresthesias, saddle anesthesia, loss of bowel/bladder control, abdominal pain, nausea, vomiting, diarrhea, constipation, dysuria, hematuria.  Patient took Advil  this morning.  Patient was seen at this urgent care on 10/25/2023; diagnosed with acute URI; treated with albuterol  inhaler, prednisone , Augmentin.  The history is provided by the patient and medical records.    Past Medical History:  Diagnosis Date   Abnormal liver enzymes    Abnormal uterine bleeding (AUB)    ADHD (attention deficit hyperactivity disorder)    Anxiety    Depression    Dysmenorrhea    Elevated carcinoembryonic antigen (CEA)    GERD (gastroesophageal reflux disease)    Heart murmur    History of COVID-19    History of liver biopsy    Hyperlipidemia    Migraine    OSA on CPAP    Pelvic pain    PONV (postoperative nausea and vomiting)    very nauseated after breast reduction   Psoriatic arthritis [facility])     Patient Active Problem List   Diagnosis Date Noted   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   Piriformis syndrome of left side 06/01/2023   Rubella non-immune status, antepartum 09/17/2019   Term pregnancy 09/15/2019   Pregnancy 09/14/2019   Amniotic fluid index increased 07/26/2019   History of liver biopsy 02/05/2019   Sacroiliac joint pain 07/25/2018   Nonallopathic lesion of sacral region 07/25/2018   Nonallopathic lesion of thoracic region 07/25/2018   Nonallopathic lesion of lumbosacral region 07/25/2018    Elevated LFTs 11/03/2016   Bloating 11/03/2016   Abnormal liver enzymes 07/31/2014   Acid reflux 07/31/2014   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   Cardiac murmur 07/31/2014   Avitaminosis D 07/31/2014   HLD (hyperlipidemia) 07/31/2014   Elevated carcinoembryonic antigen 07/31/2014   Anxiety and depression 07/12/2014   Dysmenorrhea 07/12/2014   Contraception management 07/12/2014    Past Surgical History:  Procedure Laterality Date   BREAST SURGERY Bilateral 2014   Breast Reduction   CYSTOSCOPY N/A 08/05/2023   Procedure: CYSTOSCOPY;  Surgeon: Schermerhorn, Beverli GAILS, MD;  Location: [facility] ORS;  Service: Gynecology;  Laterality: N/A;   HYSTERECTOMY, TOTAL, LAPAROSCOPIC, ROBOT-ASSISTED WITH SALPINGECTOMY Bilateral 08/05/2023   Procedure: HYSTERECTOMY, TOTAL, LAPAROSCOPIC, ROBOT-ASSISTED WITH SALPINGECTOMY;  Surgeon: Schermerhorn, Beverli GAILS, MD;  Location: [facility] OR[facility];  Service: Gynecology;  Laterality: Bilateral;   LIVER BIOPSY  08/10/14    OB History     Gravida  2   Para  2   Term  2   Preterm  0   AB  0   Living  2      SAB  0   IAB  0   Ectopic  0   Multiple  0   Live Births  2            Home Medications    Prior to Admission medications   Medication Sig Start Date End Date Taking? Authorizing Provider  albuterol  (VENTOLIN  HFA) 108 (90 Base) MCG/ACT  inhaler Inhale 2 puffs into the lungs every 6 (six) hours as needed for wheezing or shortness of breath. 10/25/23   Teresa Shelba SAUNDERS, NP  amoxicillin -clavulanate (AUGMENTIN) 875-125 MG tablet Take 1 tablet by mouth every 12 (twelve) hours. Patient not taking: Reported on 11/10/2023 10/25/23   Teresa Shelba SAUNDERS, NP  busPIRone (BUSPAR) 10 MG tablet Take 10 mg by mouth 2 (two) times daily. 07/15/23   [provider]  carboxymethylcellulose (REFRESH PLUS) 0.5 % SOLN Place 1 drop into both eyes 3 (three) times daily as needed (dry/irritated eyes.).    [provider]  escitalopram (LEXAPRO) 10 MG tablet  Take 15 mg by mouth at bedtime.    [provider]  escitalopram (LEXAPRO) 20 MG tablet Take 20 mg by mouth at bedtime. 10/11/23   [provider]  famotidine (PEPCID) 20 MG tablet Take 20 mg by mouth at bedtime as needed for indigestion or heartburn.    [provider]  ibuprofen  (ADVIL ) 200 MG tablet Take 600 mg by mouth daily as needed (pain.).    [provider]  inFLIXimab (REMICADE IV) Inject 1 application  into the vein every 8 (eight) weeks. Titrating up to every 8 weeks.    [provider]  Magnesium 250 MG CAPS Take 250 mg by mouth 3 (three) times a week.    [provider]  methylphenidate 27 MG PO CR tablet Take 27 mg by mouth in the morning.    [provider]  pantoprazole  (PROTONIX ) 40 MG tablet Take 40 mg by mouth in the morning. 03/31/22 07/22/26  [provider]  predniSONE  (STERAPRED UNI-PAK 21 TAB) 10 MG (21) TBPK tablet Take by mouth daily. Take 6 tabs by mouth daily  for 1 days, then 5 tabs for 1 days, then 4 tabs for 1 days, then 3 tabs for 1 days, 2 tabs for 1 days, then 1 tab by mouth daily for 1 days Patient not taking: Reported on 11/10/2023 10/25/23   Teresa Shelba SAUNDERS, NP    Family History Family History  Problem Relation Age of Onset   Hypercholesterolemia Mother    Heart disease Father    Alcohol abuse Father    Breast cancer Paternal Grandmother        38s   Colon cancer Paternal Grandmother        40s   Ovarian cancer Neg Hx     Social History Social History   Tobacco Use   Smoking status: Never   Smokeless tobacco: Never  Vaping Use   Vaping status: Never Used  Substance Use Topics   Alcohol use: Yes    Comment: rare   Drug use: Not Currently    Types: Marijuana    Comment: rare occ     Allergies   Gelatein mct [compleat]   Review of Systems Review of Systems  Constitutional:  Negative for chills and fever.  Gastrointestinal:  Negative for abdominal pain, constipation,  diarrhea, nausea and vomiting.  Genitourinary:  Negative for dysuria and hematuria.  Musculoskeletal:  Positive for arthralgias and gait problem. Negative for back pain and joint swelling.  Skin:  Negative for color change, rash and wound.  Neurological:  Negative for weakness and numbness.     Physical Exam Triage Vital Signs ED Triage Vitals [11/10/23 0951]  Encounter Vitals Group     BP 116/78     Girls Systolic BP Percentile      Girls Diastolic BP Percentile      Boys  Systolic BP Percentile      Boys Diastolic BP Percentile      Pulse Rate 86     Resp 18     Temp 98 F (36.7 C)     Temp src      SpO2 97 %     Weight      Height      Head Circumference      Peak Flow      Pain Score      Pain Loc      Pain Education      Exclude from Growth Chart    No data found.  Updated Vital Signs BP 116/78   Pulse 86   Temp 98 F (36.7 C)   Resp 18   LMP 07/21/2023   SpO2 97%   Visual Acuity Right Eye Distance:   Left Eye Distance:   Bilateral Distance:    Right Eye Near:   Left Eye Near:    Bilateral Near:     Physical Exam Constitutional:      General: She is not in acute distress. HENT:     Mouth/Throat:     Mouth: Mucous membranes are moist.  Cardiovascular:     Rate and Rhythm: Normal rate.  Pulmonary:     Effort: Pulmonary effort is normal. No respiratory distress.  Abdominal:     General: Bowel sounds are normal.     Palpations: Abdomen is soft.     Tenderness: There is no abdominal tenderness. There is no guarding or rebound.  Musculoskeletal:        General: Tenderness present. No swelling or deformity. Normal range of motion.     Comments: Generalized left hip tenderness.  Skin:    General: Skin is warm and dry.     Capillary Refill: Capillary refill takes less than 2 seconds.     Findings: No bruising, erythema, lesion or rash.  Neurological:     General: No focal deficit present.     Mental Status: She is alert.     Sensory: No sensory  deficit.     Motor: No weakness.     Gait: Gait abnormal.     Comments: Limping gait      UC Treatments / Results  Labs (all labs ordered are listed, but only abnormal results are displayed) Labs Reviewed - No data to display  EKG   Radiology DG Hip Unilat With Pelvis 2-3 Views Left Result Date: 11/10/2023 CLINICAL DATA:  Left hip pain after injury with 2 days of symptoms. EXAM: DG HIP (WITH OR WITHOUT PELVIS) 2-3V LEFT COMPARISON:  CT 02/02/2023 FINDINGS: There is no evidence of hip fracture or dislocation. There is no evidence of arthropathy or other focal bone abnormality. IMPRESSION: Negative. Electronically Signed   By: Toribio Agreste M.D.   On: 11/10/2023 11:11    Procedures Procedures (including critical care time)  Medications Ordered in UC Medications - No data to display  Initial Impression / Assessment and Plan / UC Course  I have reviewed the triage vital signs and the nursing notes.  Pertinent labs & imaging results that were available during my care of the patient were reviewed by me and considered in my medical decision making (see chart for details).   Left hip pain.  Afebrile and vital signs are stable. Xray negative.  Discussed symptomatic treatment including Tylenol  or ibuprofen .  Instructed patient to follow-up with her PCP or an orthopedist or her rheumatologist if her symptoms persist or  worsen.  Education provided on hip pain.  Contact information for on-call Ortho provided.  She agrees to plan of care.   Final Clinical Impressions(s) / UC Diagnoses   Final diagnoses:  Left hip pain     Discharge Instructions      Take Tylenol  or ibuprofen  as needed for discomfort.  Follow-up with your primary care provider or an orthopedist or your rheumatologist if your symptoms are not improving.     ED Prescriptions   None    PDMP not reviewed this encounter.   Corlis Burnard DEL, NP 11/10/23 1124

## 2023-11-10 NOTE — ED Triage Notes (Addendum)
 Patient to Urgent Care with complaints of left sided hip pain after possible injury (strenuous activity).   Symptoms x2 days. Worsened yesterday.   Advil  this morning 5am.

## 2023-11-10 NOTE — Discharge Instructions (Signed)
 Take Tylenol  or ibuprofen  as needed for discomfort.  Follow-up with your primary care provider or an orthopedist or your rheumatologist if your symptoms are not improving.
